# Patient Record
Sex: Male | Born: 1980 | Race: White | Hispanic: No | Marital: Single | Smoking: Current every day smoker
Health system: Southern US, Community
[De-identification: ages and names within clinical notes are randomized; demographics above are authoritative.]

## PROBLEM LIST (undated history)

## (undated) DIAGNOSIS — F32A Depression, unspecified: Secondary | ICD-10-CM

## (undated) DIAGNOSIS — F1020 Alcohol dependence, uncomplicated: Secondary | ICD-10-CM

## (undated) DIAGNOSIS — F329 Major depressive disorder, single episode, unspecified: Secondary | ICD-10-CM

## (undated) DIAGNOSIS — F419 Anxiety disorder, unspecified: Secondary | ICD-10-CM

## (undated) DIAGNOSIS — F1910 Other psychoactive substance abuse, uncomplicated: Secondary | ICD-10-CM

## (undated) HISTORY — DX: Other psychoactive substance abuse, uncomplicated: F19.10

---

## 2018-12-24 ENCOUNTER — Emergency Department (HOSPITAL_COMMUNITY)
Admission: EM | Admit: 2018-12-24 | Discharge: 2018-12-24 | Disposition: A | Discharge status: Home / Self Care | Payer: Self-pay | Attending: Emergency Medicine | Admitting: Emergency Medicine

## 2018-12-24 ENCOUNTER — Encounter (HOSPITAL_COMMUNITY): Payer: Self-pay | Admitting: Emergency Medicine

## 2018-12-24 ENCOUNTER — Other Ambulatory Visit: Payer: Self-pay

## 2018-12-24 ENCOUNTER — Emergency Department (HOSPITAL_COMMUNITY): Payer: Self-pay

## 2018-12-24 DIAGNOSIS — F419 Anxiety disorder, unspecified: Secondary | ICD-10-CM | POA: Insufficient documentation

## 2018-12-24 DIAGNOSIS — R0789 Other chest pain: Secondary | ICD-10-CM | POA: Insufficient documentation

## 2018-12-24 DIAGNOSIS — F17210 Nicotine dependence, cigarettes, uncomplicated: Secondary | ICD-10-CM | POA: Insufficient documentation

## 2018-12-24 DIAGNOSIS — R110 Nausea: Secondary | ICD-10-CM | POA: Insufficient documentation

## 2018-12-24 DIAGNOSIS — R451 Restlessness and agitation: Secondary | ICD-10-CM | POA: Insufficient documentation

## 2018-12-24 DIAGNOSIS — Z79899 Other long term (current) drug therapy: Secondary | ICD-10-CM | POA: Insufficient documentation

## 2018-12-24 DIAGNOSIS — R6884 Jaw pain: Secondary | ICD-10-CM | POA: Insufficient documentation

## 2018-12-24 HISTORY — DX: Major depressive disorder, single episode, unspecified: F32.9

## 2018-12-24 HISTORY — DX: Anxiety disorder, unspecified: F41.9

## 2018-12-24 HISTORY — DX: Depression, unspecified: F32.A

## 2018-12-24 LAB — COMPREHENSIVE METABOLIC PANEL
ALT: 32 U/L (ref 0–44)
ANION GAP: 8 (ref 5–15)
AST: 26 U/L (ref 15–41)
Albumin: 5 g/dL (ref 3.5–5.0)
Alkaline Phosphatase: 82 U/L (ref 38–126)
BUN: 8 mg/dL (ref 6–20)
CO2: 26 mmol/L (ref 22–32)
Calcium: 9.8 mg/dL (ref 8.9–10.3)
Chloride: 106 mmol/L (ref 98–111)
Creatinine, Ser: 0.98 mg/dL (ref 0.61–1.24)
GFR calc Af Amer: >60 mL/min (ref >60)
GFR calc non Af Amer: >60 mL/min (ref >60)
Glucose, Bld: 101 mg/dL — ABNORMAL HIGH (ref 70–99)
POTASSIUM: 4.2 mmol/L (ref 3.5–5.1)
Sodium: 140 mmol/L (ref 135–145)
Total Bilirubin: 0.7 mg/dL (ref 0.3–1.2)
Total Protein: 8.4 g/dL — ABNORMAL HIGH (ref 6.5–8.1)

## 2018-12-24 LAB — URINALYSIS, ROUTINE W REFLEX MICROSCOPIC
BILIRUBIN URINE: NEGATIVE
Glucose, UA: NEGATIVE mg/dL
Hgb urine dipstick: NEGATIVE
Ketones, ur: NEGATIVE mg/dL
Leukocytes, UA: NEGATIVE
Nitrite: NEGATIVE
PROTEIN: NEGATIVE mg/dL
Specific Gravity, Urine: 1.006 (ref 1.005–1.030)
pH: 8 (ref 5.0–8.0)

## 2018-12-24 LAB — CBC WITH DIFFERENTIAL/PLATELET
Abs Immature Granulocytes: 0.05 K/uL (ref 0.00–0.07)
Basophils Absolute: 0.1 K/uL (ref 0.0–0.1)
Basophils Relative: 1 %
Eosinophils Absolute: 0.1 K/uL (ref 0.0–0.5)
Eosinophils Relative: 1 %
HCT: 49.1 % (ref 39.0–52.0)
Hemoglobin: 15.9 g/dL (ref 13.0–17.0)
Immature Granulocytes: 0 %
Lymphocytes Relative: 18 %
Lymphs Abs: 2.1 K/uL (ref 0.7–4.0)
MCH: 29.6 pg (ref 26.0–34.0)
MCHC: 32.4 g/dL (ref 30.0–36.0)
MCV: 91.4 fL (ref 80.0–100.0)
Monocytes Absolute: 0.7 K/uL (ref 0.1–1.0)
Monocytes Relative: 6 %
Neutro Abs: 8.7 K/uL — ABNORMAL HIGH (ref 1.7–7.7)
Neutrophils Relative %: 74 %
Platelets: 267 K/uL (ref 150–400)
RBC: 5.37 MIL/uL (ref 4.22–5.81)
RDW: 13.8 % (ref 11.5–15.5)
WBC: 11.7 K/uL — ABNORMAL HIGH (ref 4.0–10.5)
nRBC: 0 % (ref 0.0–0.2)

## 2018-12-24 LAB — LIPASE, BLOOD: Lipase: 39 U/L (ref 11–51)

## 2018-12-24 LAB — ETHANOL: Alcohol, Ethyl (B): <10 mg/dL (ref <10)

## 2018-12-24 MED ORDER — LORAZEPAM 1 MG PO TABS
1.0000 mg | ORAL_TABLET | Freq: Once | ORAL | Status: AC
  Administered 2018-12-24: 1 mg via ORAL
  Filled 2018-12-24: qty 1

## 2018-12-24 NOTE — Discharge Instructions (Signed)
As discussed, your evaluation today has been largely reassuring.  But, it is important that you monitor your condition carefully, and do not hesitate to return to the ED if you develop new, or concerning changes in your condition. ? ?Otherwise, please follow-up with your physician for appropriate ongoing care. ? ?

## 2018-12-24 NOTE — ED Triage Notes (Signed)
Pt states he thinks he is having a panic attack. Reports dizziness when he woke up and states, "I feel like my body is seizing up." Also endorses chest tightness. States he is "coming off" of Subutex. Last taken 4-5 days ago.

## 2018-12-24 NOTE — ED Provider Notes (Signed)
[facility] EMERGENCY DEPARTMENT Provider Note   CSN: [id number] Arrival date & time: 12/24/18  1037     History   Chief Complaint Chief Complaint  Patient presents with  . Panic Attack    HPI Carlos Flowers is a 38 y.o. male.  HPI Patient presents concern of chest pain, jaw pain, restlessness. Patient has no history of cardiac disease, no family history of early cardiac death. He also denies history of diabetes, hypertension or other medical complaints. He does, however, have a history of anxiety, depression for which he takes gabapentin and Wellbutrin. Patient also smokes cigarettes Patient also notes a history of long-term drug use, most notably cocaine. Patient recently stopped his illicit substances, has been using Suboxone. However, last dose of Suboxone was approximately 4 days ago, and he entered a rehabilitation program yesterday. Over the past few hours patient has had persistent discomfort in his chest, jaw, nausea, anxiousness, without dyspnea, syncope, though there is some lightheadedness. Symptoms have improved somewhat since onset, though without clear intervention. No clear alleviating or exacerbating factors.  Past Medical History:  Diagnosis Date  . Anxiety   . Depression     There are no active problems to display for this patient.   History reviewed. No pertinent surgical history.      Home Medications    Prior to Admission medications   Not on File    Family History No family history on file.  Social History Social History   Tobacco Use  . Smoking status: Current Every Day Smoker    Packs/day: 1.00    Types: Cigarettes  . Smokeless tobacco: Never Used  Substance Use Topics  . Alcohol use: Not Currently    Comment: former   . Drug use: Not Currently    Types: Cocaine    Comment: @ opiates, last use 2 weeks ago     Allergies   Patient has no known allergies.   Review of Systems Review of Systems  Constitutional:   Per HPI, otherwise negative  HENT:       Per HPI, otherwise negative  Respiratory:       Per HPI, otherwise negative  Cardiovascular:       Per HPI, otherwise negative  Gastrointestinal: Negative for vomiting.  Endocrine:       Negative aside from HPI  Genitourinary:       Neg aside from HPI   Musculoskeletal:       Per HPI, otherwise negative  Skin: Negative.   Neurological: Negative for syncope.     Physical Exam Updated Vital Signs BP (!) 150/96 (BP Location: Right Arm)   Pulse 79   Temp (!) 97.5 F (36.4 C) (Oral)   Resp 18   Ht 6' (1.829 m)   Wt 80.7 kg   SpO2 100%   BMI 24.14 kg/m   Physical Exam Vitals signs and nursing note reviewed.  Constitutional:      General: He is not in acute distress.    Appearance: He is well-developed.  HENT:     Head: Normocephalic and atraumatic.  Eyes:     Conjunctiva/sclera: Conjunctivae normal.  Cardiovascular:     Rate and Rhythm: Normal rate and regular rhythm.  Pulmonary:     Effort: Pulmonary effort is normal. No respiratory distress.     Breath sounds: No stridor.  Abdominal:     General: There is no distension.  Skin:    General: Skin is warm and dry.  Neurological:  Mental Status: He is alert and oriented to person, place, and time.      ED Treatments / Results  Labs (all labs ordered are listed, but only abnormal results are displayed) Labs Reviewed  COMPREHENSIVE METABOLIC PANEL - Abnormal; Notable for the following components:      Result Value   Glucose, Bld 101 (*)    Total Protein 8.4 (*)    All other components within normal limits  CBC WITH DIFFERENTIAL/PLATELET - Abnormal; Notable for the following components:   WBC 11.7 (*)    Neutro Abs 8.7 (*)    All other components within normal limits  LIPASE, BLOOD  ETHANOL  URINALYSIS, ROUTINE W REFLEX MICROSCOPIC    EKG Sinus rhythm, rate 81, unremarkable aside from mild early re-pole pattern    Procedures Procedures (including critical  care time)  Medications Ordered in ED Medications  LORazepam (ATIVAN) tablet 1 mg (has no administration in time range)     Initial Impression / Assessment and Plan / ED Course  I have reviewed the triage vital signs and the nursing notes.  Pertinent labs & imaging results that were available during my care of the patient were reviewed by me and considered in my medical decision making (see chart for details).  Revisited patient is in no distress, is awake, alert. Labs reassuring, EKG reassuring, no evidence for ACS, or other acute new pathology. Patient has a mild leukocytosis, though no fever, no cough, no evidence for pneumonia.  Suspicion for withdrawal contributing to some of the patient's symptoms. Patient is, however appropriate for return to his rehabilitation facility, in stable condition.    Final Clinical Impressions(s) / ED Diagnoses  Atypical chest pain   Gerhard Munch, MD 12/24/18 1331

## 2018-12-30 ENCOUNTER — Emergency Department (HOSPITAL_COMMUNITY): Payer: Self-pay

## 2018-12-30 ENCOUNTER — Emergency Department (HOSPITAL_COMMUNITY)
Admission: EM | Admit: 2018-12-30 | Discharge: 2018-12-30 | Disposition: A | Discharge status: Home / Self Care | Payer: Self-pay | Attending: Emergency Medicine | Admitting: Emergency Medicine

## 2018-12-30 ENCOUNTER — Encounter (HOSPITAL_COMMUNITY): Payer: Self-pay | Admitting: Emergency Medicine

## 2018-12-30 ENCOUNTER — Other Ambulatory Visit: Payer: Self-pay

## 2018-12-30 DIAGNOSIS — F17210 Nicotine dependence, cigarettes, uncomplicated: Secondary | ICD-10-CM | POA: Insufficient documentation

## 2018-12-30 DIAGNOSIS — F410 Panic disorder [episodic paroxysmal anxiety] without agoraphobia: Secondary | ICD-10-CM | POA: Insufficient documentation

## 2018-12-30 LAB — TROPONIN I: Troponin I: <0.03 ng/mL (ref <0.03)

## 2018-12-30 LAB — BASIC METABOLIC PANEL
Anion gap: 10 (ref 5–15)
BUN: 10 mg/dL (ref 6–20)
CO2: 22 mmol/L (ref 22–32)
Calcium: 9.6 mg/dL (ref 8.9–10.3)
Chloride: 106 mmol/L (ref 98–111)
Creatinine, Ser: 1.06 mg/dL (ref 0.61–1.24)
GFR calc Af Amer: >60 mL/min (ref >60)
GFR calc non Af Amer: >60 mL/min (ref >60)
Glucose, Bld: 97 mg/dL (ref 70–99)
POTASSIUM: 3.6 mmol/L (ref 3.5–5.1)
Sodium: 138 mmol/L (ref 135–145)

## 2018-12-30 LAB — CBC
HCT: 47.4 % (ref 39.0–52.0)
Hemoglobin: 15.5 g/dL (ref 13.0–17.0)
MCH: 29.1 pg (ref 26.0–34.0)
MCHC: 32.7 g/dL (ref 30.0–36.0)
MCV: 88.9 fL (ref 80.0–100.0)
PLATELETS: 315 10*3/uL (ref 150–400)
RBC: 5.33 MIL/uL (ref 4.22–5.81)
RDW: 13.8 % (ref 11.5–15.5)
WBC: 9.1 10*3/uL (ref 4.0–10.5)
nRBC: 0 % (ref 0.0–0.2)

## 2018-12-30 MED ORDER — LORAZEPAM 1 MG PO TABS
1.0000 mg | ORAL_TABLET | Freq: Once | ORAL | Status: AC
  Administered 2018-12-30: 1 mg via ORAL
  Filled 2018-12-30: qty 1

## 2018-12-30 MED ORDER — SODIUM CHLORIDE 0.9% FLUSH: 3.0000 mL | Freq: Once | INTRAVENOUS | Status: DC

## 2018-12-30 NOTE — ED Provider Notes (Signed)
[facility] EMERGENCY DEPARTMENT Provider Note   CSN: [id number] Arrival date & time: 12/30/18  1039     History   Chief Complaint Chief Complaint  Patient presents with  . Chest Pain    HPI Carlos Flowers is a 38 y.o. male.  HPI  38 year old male comes in with chief complaint of palpitations, chest discomfort and shortness of breath. Patient is currently at a substance abuse recovery home.  He reports that he has not had any drugs in over a week.  He used to use cocaine, opiates and Subutex.  He states that this morning all of a sudden he started having left-sided chest discomfort with shortness of breath.  He also felt numbness and tingling.  The symptoms were similar to the previous episode he had last week.  He denies any specific provoking, aggravating or relieving factors.  Past Medical History:  Diagnosis Date  . Anxiety   . Depression     There are no active problems to display for this patient.   History reviewed. No pertinent surgical history.      Home Medications    Prior to Admission medications   Medication Sig Start Date End Date Taking? Authorizing Provider  buPROPion (WELLBUTRIN XL) 300 MG 24 hr tablet Take 450 mg by mouth daily.   Yes [provider]  gabapentin (NEURONTIN) 600 MG tablet Take 600 mg by mouth 4 (four) times daily.   Yes [provider]    Family History History reviewed. No pertinent family history.  Social History Social History   Tobacco Use  . Smoking status: Current Every Day Smoker    Packs/day: 1.00    Types: Cigarettes  . Smokeless tobacco: Never Used  Substance Use Topics  . Alcohol use: Not Currently    Comment: former   . Drug use: Not Currently    Types: Cocaine    Comment: @ opiates, last use 2 weeks ago     Allergies   Peanut-containing drug products   Review of Systems Review of Systems  Constitutional: Positive for activity change.  Respiratory: Positive for chest tightness and  shortness of breath.   Cardiovascular: Positive for chest pain.  Neurological: Negative for syncope.     Physical Exam Updated Vital Signs BP (!) 123/95 (BP Location: Right Arm)   Pulse 82   Temp 97.6 F (36.4 C) (Oral)   Resp 17   Ht 6' (1.829 m)   Wt 81.6 kg   SpO2 100%   BMI 24.41 kg/m   Physical Exam Vitals signs and nursing note reviewed.  Constitutional:      Appearance: He is well-developed.  HENT:     Head: Atraumatic.  Neck:     Musculoskeletal: Neck supple.  Cardiovascular:     Rate and Rhythm: Normal rate.     Heart sounds: Normal heart sounds.  Pulmonary:     Effort: Pulmonary effort is normal.     Breath sounds: Normal breath sounds.  Skin:    General: Skin is warm.  Neurological:     Mental Status: He is alert and oriented to person, place, and time.      ED Treatments / Results  Labs (all labs ordered are listed, but only abnormal results are displayed) Labs Reviewed  BASIC METABOLIC PANEL  CBC  TROPONIN I    EKG EKG Interpretation  Date/Time:  Wednesday December 30 2018 10:50:30 EST Ventricular Rate:  94 PR Interval:  138 QRS Duration: 90 QT Interval:  360 QTC  Calculation: 450 R Axis:   91 Text Interpretation:  Normal sinus rhythm Rightward axis Borderline ECG No significant change since last tracing Confirmed by Linwood Dibbles [id number]) on 12/30/2018 11:11:48 AM   Radiology Dg Chest 2 View  Result Date: 12/30/2018 CLINICAL DATA:  Left chest pain and shortness of breath beginning this morning. EXAM: CHEST - 2 VIEW COMPARISON:  PA and lateral chest 12/24/2018. FINDINGS: The lungs are clear. Heart size is normal. No pneumothorax or pleural fluid. No acute or focal bony abnormality. IMPRESSION: Negative chest. Electronically Signed   By: Drusilla Kanner M.D.   On: 12/30/2018 11:19    Procedures Procedures (including critical care time)  Medications Ordered in ED Medications  sodium chloride flush (NS) 0.9 % injection 3 mL (has no  administration in time range)  LORazepam (ATIVAN) tablet 1 mg (1 mg Oral Given 12/30/18 1436)     Initial Impression / Assessment and Plan / ED Course  I have reviewed the triage vital signs and the nursing notes.  Pertinent labs & imaging results that were available during my care of the patient were reviewed by me and considered in my medical decision making (see chart for details).    38 year old male comes in with chief complaint of chest discomfort.  He has history of polysubstance abuse and is currently residing at a rehab facility.  He has been drug-free for more than a week.  He had an episode last week which was similar to the events he had today.  Patient had unprovoked episode earlier today where he started having chest discomfort, palpitations, shortness of breath.  His breathing got worse and he called EMS.  By the time we saw him he was calm and did not have any respiratory distress.  Vital signs are within normal limits.  Patient does not have any family history of premature CAD or sudden cardiac death.  He has been monitored on telemetry for extended period time without any alarms.  I suspect that his symptoms could have been because of drug withdrawal or because of psychiatry medications that he was started.  EKG is not showing any QT prolongation.  Stable for discharge.   Final Clinical Impressions(s) / ED Diagnoses   Final diagnoses:  Anxiety attack    ED Discharge Orders    None       Derwood Kaplan, MD 12/30/18 [phone]

## 2018-12-30 NOTE — Discharge Instructions (Addendum)
We suspect that the event you had prior to ER arrival was an anxiety attack. There could be an element of withdrawals Please discuss your symptoms with your psychiatrist.

## 2018-12-30 NOTE — ED Triage Notes (Addendum)
Patient complaining of left sided chest pain and shortness of breath starting this morning. Patient has paperwork that states he is from [facility] and not to give patient opiates or benzos. Patient agitated in triage, states "I don't understand why ya'll don't take this seriously, I could be dying and no one would care."

## 2019-01-07 ENCOUNTER — Ambulatory Visit: Payer: Self-pay | Admitting: Physician Assistant

## 2019-01-07 ENCOUNTER — Encounter: Payer: Self-pay | Admitting: Physician Assistant

## 2019-01-07 VITALS — BP 129/81 | HR 76 | Temp 97.9°F | Ht 71.5 in | Wt 187.0 lb

## 2019-01-07 DIAGNOSIS — F419 Anxiety disorder, unspecified: Secondary | ICD-10-CM

## 2019-01-07 DIAGNOSIS — F1911 Other psychoactive substance abuse, in remission: Secondary | ICD-10-CM

## 2019-01-07 DIAGNOSIS — Z7689 Persons encountering health services in other specified circumstances: Secondary | ICD-10-CM

## 2019-01-07 DIAGNOSIS — F17200 Nicotine dependence, unspecified, uncomplicated: Secondary | ICD-10-CM

## 2019-01-07 NOTE — Progress Notes (Signed)
BP 129/81 (BP Location: Left Arm, Patient Position: Sitting, Cuff Size: Normal)   Pulse 76   Temp 97.9 F (36.6 C)   Ht 5' 11.5" (1.816 m)   Wt 187 lb (84.8 kg)   SpO2 98%   BMI 25.72 kg/m    Subjective:    Patient ID: Carlos Flowers, male    DOB: 1981-04-09, 38 y.o.   MRN: [id number]  HPI: Carlos Flowers is a 38 y.o. male presenting on 01/07/2019 for New Patient (Initial Visit) (current pt at [facility] for mental health. pt states he has not had PCP)   HPI   Chief Complaint  Patient presents with  . New Patient (Initial Visit)    current pt at [facility] for mental health. pt states he has not had PCP     Pt moved into [facility] house about 1/22 for treatment of polysubstance abuse.  Prior to that, pt was working and living in [place].   Pt has no complaints today except anxiety.   Relevant past medical, surgical, family and social history reviewed and updated as indicated. Interim medical history since our last visit reviewed. Allergies and medications reviewed and updated.   Current Outpatient Medications:  .  buPROPion (WELLBUTRIN XL) 300 MG 24 hr tablet, Take 450 mg by mouth daily., Disp: , Rfl:  .  gabapentin (NEURONTIN) 600 MG tablet, Take 600 mg by mouth 4 (four) times daily., Disp: , Rfl:     Review of Systems  Constitutional: Positive for diaphoresis, fatigue and unexpected weight change. Negative for appetite change, chills and fever.  HENT: Positive for dental problem. Negative for congestion, drooling, ear pain, facial swelling, hearing loss, mouth sores, sneezing, sore throat, trouble swallowing and voice change.   Eyes: Negative for pain, discharge, redness, itching and visual disturbance.  Respiratory: Negative for cough, choking, shortness of breath and wheezing.   Cardiovascular: Negative for chest pain, palpitations and leg swelling.  Gastrointestinal: Negative for abdominal pain, blood in stool, constipation, diarrhea and vomiting.  Endocrine:  Negative for cold intolerance, heat intolerance and polydipsia.  Genitourinary: Negative for decreased urine volume, dysuria and hematuria.  Musculoskeletal: Negative for arthralgias, back pain and gait problem.  Skin: Negative for rash.  Allergic/Immunologic: Negative for environmental allergies.  Neurological: Negative for seizures, syncope, light-headedness and headaches.  Hematological: Negative for adenopathy.  Psychiatric/Behavioral: Positive for agitation and dysphoric mood. Negative for suicidal ideas. The patient is nervous/anxious.     Per HPI unless specifically indicated above     Objective:    BP 129/81 (BP Location: Left Arm, Patient Position: Sitting, Cuff Size: Normal)   Pulse 76   Temp 97.9 F (36.6 C)   Ht 5' 11.5" (1.816 m)   Wt 187 lb (84.8 kg)   SpO2 98%   BMI 25.72 kg/m   Wt Readings from Last 3 Encounters:  01/07/19 187 lb (84.8 kg)  12/30/18 180 lb (81.6 kg)  12/24/18 178 lb (80.7 kg)    Physical Exam Vitals signs reviewed.  Constitutional:      Appearance: He is well-developed.  HENT:     Head: Normocephalic and atraumatic.     Mouth/Throat:     Pharynx: No oropharyngeal exudate.  Eyes:     Conjunctiva/sclera: Conjunctivae normal.     Pupils: Pupils are equal, round, and reactive to light.  Neck:     Musculoskeletal: Neck supple.     Thyroid: No thyromegaly.  Cardiovascular:     Rate and Rhythm: Normal rate and regular rhythm.  Pulmonary:  Effort: Pulmonary effort is normal.     Breath sounds: Normal breath sounds. No wheezing or rales.  Abdominal:     General: Bowel sounds are normal.     Palpations: Abdomen is soft. There is no mass.     Tenderness: There is no abdominal tenderness.  Lymphadenopathy:     Cervical: No cervical adenopathy.  Skin:    General: Skin is warm and dry.     Findings: No rash.  Neurological:     Mental Status: He is alert and oriented to person, place, and time.  Psychiatric:        Behavior: Behavior  normal.        Thought Content: Thought content normal.     Comments: Pt mildly anxious appearing but is appropriate and pleasant and in no distress.     Results for orders placed or performed during the hospital encounter of 12/30/18  Basic metabolic panel  Result Value Ref Range   Sodium 138 135 - 145 mmol/L   Potassium 3.6 3.5 - 5.1 mmol/L   Chloride 106 98 - 111 mmol/L   CO2 22 22 - 32 mmol/L   Glucose, Bld 97 70 - 99 mg/dL   BUN 10 6 - 20 mg/dL   Creatinine, Ser 1.611.06 0.61 - 1.24 mg/dL   Calcium 9.6 8.9 - 09.610.3 mg/dL   GFR calc non Af Amer >60 >60 mL/min   GFR calc Af Amer >60 >60 mL/min   Anion gap 10 5 - 15  CBC  Result Value Ref Range   WBC 9.1 4.0 - 10.5 K/uL   RBC 5.33 4.22 - 5.81 MIL/uL   Hemoglobin 15.5 13.0 - 17.0 g/dL   HCT 04.547.4 40.939.0 - 81.152.0 %   MCV 88.9 80.0 - 100.0 fL   MCH 29.1 26.0 - 34.0 pg   MCHC 32.7 30.0 - 36.0 g/dL   RDW 91.413.8 78.211.5 - 95.615.5 %   Platelets 315 150 - 400 K/uL   nRBC 0.0 0.0 - 0.2 %  Troponin I - ONCE - STAT  Result Value Ref Range   Troponin I <0.03 <0.03 ng/mL      Assessment & Plan:    Encounter Diagnoses  Name Primary?  . Encounter to establish care Yes  . Substance abuse in remission (HCC)   . Tobacco use disorder   . Anxiety       -no further labs needed at this time -pt encouraged to continue with [facility] for his anxiety -pt encouraged to continue with [facility] house for treatment of his substance abuse -pt to follow up here 1 year.  RTO sooner prn

## 2019-11-23 ENCOUNTER — Ambulatory Visit: Payer: HRSA Program | Attending: Internal Medicine

## 2019-11-23 ENCOUNTER — Other Ambulatory Visit: Payer: Self-pay

## 2019-11-23 DIAGNOSIS — Z20828 Contact with and (suspected) exposure to other viral communicable diseases: Secondary | ICD-10-CM | POA: Diagnosis present

## 2019-11-23 DIAGNOSIS — Z20822 Contact with and (suspected) exposure to covid-19: Secondary | ICD-10-CM

## 2019-11-24 LAB — NOVEL CORONAVIRUS, NAA: SARS-CoV-2, NAA: NOT DETECTED

## 2019-12-07 ENCOUNTER — Ambulatory Visit: Payer: Self-pay | Admitting: Physician Assistant

## 2019-12-08 ENCOUNTER — Ambulatory Visit: Payer: Self-pay | Admitting: Physician Assistant

## 2020-01-06 ENCOUNTER — Ambulatory Visit: Payer: Self-pay | Admitting: Physician Assistant

## 2020-08-08 ENCOUNTER — Other Ambulatory Visit: Payer: Self-pay

## 2020-08-08 ENCOUNTER — Ambulatory Visit (HOSPITAL_COMMUNITY)
Admission: EM | Admit: 2020-08-08 | Discharge: 2020-08-10 | Disposition: A | Discharge status: Rehabilitation Facility | Payer: No Payment, Other | Attending: Psychiatry | Admitting: Psychiatry

## 2020-08-08 DIAGNOSIS — F419 Anxiety disorder, unspecified: Secondary | ICD-10-CM | POA: Insufficient documentation

## 2020-08-08 DIAGNOSIS — Z590 Homelessness: Secondary | ICD-10-CM | POA: Insufficient documentation

## 2020-08-08 DIAGNOSIS — Z20822 Contact with and (suspected) exposure to covid-19: Secondary | ICD-10-CM | POA: Insufficient documentation

## 2020-08-08 DIAGNOSIS — F17210 Nicotine dependence, cigarettes, uncomplicated: Secondary | ICD-10-CM | POA: Insufficient documentation

## 2020-08-08 DIAGNOSIS — F332 Major depressive disorder, recurrent severe without psychotic features: Secondary | ICD-10-CM | POA: Diagnosis not present

## 2020-08-08 DIAGNOSIS — F1010 Alcohol abuse, uncomplicated: Secondary | ICD-10-CM | POA: Insufficient documentation

## 2020-08-08 DIAGNOSIS — F10230 Alcohol dependence with withdrawal, uncomplicated: Secondary | ICD-10-CM

## 2020-08-08 LAB — COMPREHENSIVE METABOLIC PANEL
ALT: 18 U/L (ref 0–44)
AST: 19 U/L (ref 15–41)
Albumin: 4.6 g/dL (ref 3.5–5.0)
Alkaline Phosphatase: 101 U/L (ref 38–126)
Anion gap: 11 (ref 5–15)
BUN: <5 mg/dL — ABNORMAL LOW (ref 6–20)
CO2: 25 mmol/L (ref 22–32)
Calcium: 9.3 mg/dL (ref 8.9–10.3)
Chloride: 104 mmol/L (ref 98–111)
Creatinine, Ser: 0.86 mg/dL (ref 0.61–1.24)
GFR calc Af Amer: >60 mL/min (ref >60)
GFR calc non Af Amer: >60 mL/min (ref >60)
Glucose, Bld: 90 mg/dL (ref 70–99)
Potassium: 4 mmol/L (ref 3.5–5.1)
Sodium: 140 mmol/L (ref 135–145)
Total Bilirubin: 1.1 mg/dL (ref 0.3–1.2)
Total Protein: 7.9 g/dL (ref 6.5–8.1)

## 2020-08-08 LAB — CBC WITH DIFFERENTIAL/PLATELET
Abs Immature Granulocytes: 0.06 10*3/uL (ref 0.00–0.07)
Basophils Absolute: 0.1 10*3/uL (ref 0.0–0.1)
Basophils Relative: 1 %
Eosinophils Absolute: 0.1 10*3/uL (ref 0.0–0.5)
Eosinophils Relative: 1 %
HCT: 48.8 % (ref 39.0–52.0)
Hemoglobin: 16.3 g/dL (ref 13.0–17.0)
Immature Granulocytes: 1 %
Lymphocytes Relative: 28 %
Lymphs Abs: 2.8 10*3/uL (ref 0.7–4.0)
MCH: 30.9 pg (ref 26.0–34.0)
MCHC: 33.4 g/dL (ref 30.0–36.0)
MCV: 92.6 fL (ref 80.0–100.0)
Monocytes Absolute: 0.7 10*3/uL (ref 0.1–1.0)
Monocytes Relative: 7 %
Neutro Abs: 6.4 10*3/uL (ref 1.7–7.7)
Neutrophils Relative %: 62 %
Platelets: 253 10*3/uL (ref 150–400)
RBC: 5.27 MIL/uL (ref 4.22–5.81)
RDW: 14.2 % (ref 11.5–15.5)
WBC: 10.1 10*3/uL (ref 4.0–10.5)
nRBC: 0 % (ref 0.0–0.2)

## 2020-08-08 LAB — POC SARS CORONAVIRUS 2 AG: SARS Coronavirus 2 Ag: NEGATIVE

## 2020-08-08 LAB — SARS CORONAVIRUS 2 BY RT PCR (HOSPITAL ORDER, PERFORMED IN ~~LOC~~ HOSPITAL LAB): SARS Coronavirus 2: NEGATIVE

## 2020-08-08 LAB — LIPID PANEL
Cholesterol: 129 mg/dL (ref 0–200)
HDL: 45 mg/dL (ref >40)
LDL Cholesterol: 62 mg/dL (ref 0–99)
Total CHOL/HDL Ratio: 2.9 RATIO
Triglycerides: 111 mg/dL (ref <150)
VLDL: 22 mg/dL (ref 0–40)

## 2020-08-08 LAB — POCT URINE DRUG SCREEN - MANUAL ENTRY (I-SCREEN)
POC Amphetamine UR: NOT DETECTED
POC Buprenorphine (BUP): NOT DETECTED
POC Cocaine UR: NOT DETECTED
POC Marijuana UR: NOT DETECTED
POC Methadone UR: NOT DETECTED
POC Methamphetamine UR: NOT DETECTED
POC Morphine: NOT DETECTED
POC Oxazepam (BZO): NOT DETECTED
POC Oxycodone UR: NOT DETECTED
POC Secobarbital (BAR): NOT DETECTED

## 2020-08-08 LAB — POC SARS CORONAVIRUS 2 AG -  ED: SARS Coronavirus 2 Ag: NEGATIVE

## 2020-08-08 LAB — HEMOGLOBIN A1C
Hgb A1c MFr Bld: 5.5 % (ref 4.8–5.6)
Mean Plasma Glucose: 111.15 mg/dL

## 2020-08-08 LAB — TSH: TSH: 1.59 u[IU]/mL (ref 0.350–4.500)

## 2020-08-08 LAB — ETHANOL: Alcohol, Ethyl (B): 113 mg/dL — ABNORMAL HIGH (ref <10)

## 2020-08-08 MED ORDER — HYDROXYZINE HCL 25 MG PO TABS
25.0000 mg | ORAL_TABLET | Freq: Four times a day (QID) | ORAL | Status: DC | PRN
  Administered 2020-08-09: 25 mg via ORAL
  Filled 2020-08-08: qty 20
  Filled 2020-08-08: qty 1
  Filled 2020-08-08: qty 20

## 2020-08-08 MED ORDER — LORAZEPAM 1 MG PO TABS
1.0000 mg | ORAL_TABLET | Freq: Four times a day (QID) | ORAL | Status: DC | PRN
  Administered 2020-08-08 – 2020-08-09 (×4): 1 mg via ORAL
  Filled 2020-08-08 (×4): qty 1

## 2020-08-08 MED ORDER — ADULT MULTIVITAMIN W/MINERALS CH
1.0000 | ORAL_TABLET | Freq: Every day | ORAL | Status: DC
  Administered 2020-08-08 – 2020-08-09 (×2): 1 via ORAL
  Filled 2020-08-08 (×3): qty 1

## 2020-08-08 MED ORDER — GABAPENTIN 400 MG PO CAPS
400.0000 mg | ORAL_CAPSULE | Freq: Three times a day (TID) | ORAL | Status: DC
  Administered 2020-08-08 – 2020-08-09 (×5): 400 mg via ORAL
  Filled 2020-08-08 (×2): qty 1
  Filled 2020-08-08: qty 42
  Filled 2020-08-08 (×3): qty 1

## 2020-08-08 MED ORDER — TRAZODONE HCL 50 MG PO TABS: 50.0000 mg | ORAL_TABLET | Freq: Every evening | ORAL | Status: DC | PRN

## 2020-08-08 MED ORDER — CLONIDINE HCL 0.1 MG PO TABS
0.1000 mg | ORAL_TABLET | Freq: Every day | ORAL | Status: DC
  Administered 2020-08-08 – 2020-08-09 (×2): 0.1 mg via ORAL
  Filled 2020-08-08: qty 14
  Filled 2020-08-08 (×2): qty 1

## 2020-08-08 MED ORDER — HYDROXYZINE HCL 25 MG PO TABS: 25.0000 mg | ORAL_TABLET | Freq: Three times a day (TID) | ORAL | Status: DC | PRN

## 2020-08-08 MED ORDER — PAROXETINE HCL 20 MG PO TABS
20.0000 mg | ORAL_TABLET | Freq: Every day | ORAL | Status: DC
  Administered 2020-08-08 – 2020-08-09 (×2): 20 mg via ORAL
  Filled 2020-08-08 (×2): qty 1
  Filled 2020-08-08: qty 14

## 2020-08-08 MED ORDER — ONDANSETRON 4 MG PO TBDP
4.0000 mg | ORAL_TABLET | Freq: Four times a day (QID) | ORAL | Status: DC | PRN
  Administered 2020-08-09: 4 mg via ORAL
  Filled 2020-08-08: qty 1

## 2020-08-08 MED ORDER — LOPERAMIDE HCL 2 MG PO CAPS: 2.0000 mg | ORAL_CAPSULE | ORAL | Status: DC | PRN

## 2020-08-08 MED ORDER — NICOTINE POLACRILEX 2 MG MT GUM
2.0000 mg | CHEWING_GUM | OROMUCOSAL | Status: DC | PRN
  Administered 2020-08-09 – 2020-08-10 (×7): 2 mg via ORAL
  Filled 2020-08-08 (×5): qty 1
  Filled 2020-08-08: qty 15
  Filled 2020-08-08 (×2): qty 1

## 2020-08-08 MED ORDER — THIAMINE HCL 100 MG PO TABS
100.0000 mg | ORAL_TABLET | Freq: Every day | ORAL | Status: DC
  Administered 2020-08-09: 100 mg via ORAL
  Filled 2020-08-08: qty 1

## 2020-08-08 MED ORDER — ALUM & MAG HYDROXIDE-SIMETH 200-200-20 MG/5ML PO SUSP: 30.0000 mL | ORAL | Status: DC | PRN

## 2020-08-08 MED ORDER — MAGNESIUM HYDROXIDE 400 MG/5ML PO SUSP: 30.0000 mL | Freq: Every day | ORAL | Status: DC | PRN

## 2020-08-08 MED ORDER — ACETAMINOPHEN 325 MG PO TABS
650.0000 mg | ORAL_TABLET | Freq: Four times a day (QID) | ORAL | Status: DC | PRN
  Administered 2020-08-08 – 2020-08-09 (×2): 650 mg via ORAL
  Filled 2020-08-08 (×2): qty 2

## 2020-08-08 MED ORDER — THIAMINE HCL 100 MG/ML IJ SOLN
100.0000 mg | Freq: Once | INTRAMUSCULAR | Status: AC
  Administered 2020-08-08: 100 mg via INTRAMUSCULAR
  Filled 2020-08-08: qty 2

## 2020-08-08 NOTE — ED Notes (Signed)
Blood drawn from right arm ac with 23g needle. Tolerated well. Pressure bandage appliled. No new issues noted

## 2020-08-08 NOTE — BH Assessment (Signed)
Comprehensive Clinical Assessment (CCA) Note  08/08/2020 Carlos Flowers [id number]   Patient is a 39 year old male presenting voluntarily to [facility] for assessment. Patient endorses SI, alcohol withdrawal, and increased anxiety. Patient reports he has been homeless and therefore has moved around a lot. Prior to being homeless he was in a sober living facility but left because he met a woman. Patient reports he has been off his medications (Paxil, Gabapentin, and Clonidine) for about 1 month because he cannot afford them. He currently endorses passive SI. He denies any prior attempts or self-harming behavior. Patient denies HI/AVH. He endorses daily alcohol use. He states he drinks 1 case of beer daily and had 6-8 beers earlier this date. Patient denies any other substance use.  Visit Diagnosis:   F33.2 MDD, recurrent, severe    F10.20 Alcohol use disorder, severe  Marvia Pickles, PMHNP recommends patient be admitted to [facility] for observation.  CCA Screening, Triage and Referral (STR)  Patient did not complete STR.   CCA Biopsychosocial  Intake/Chief Complaint:  CCA Intake With Chief Complaint CCA Part Two Date: 08/08/20 CCA Part Two Time: 5176 Chief Complaint/Presenting Problem: NA Patient's Currently Reported Symptoms/Problems: NA Individual's Strengths: NA Individual's Preferences: NA Individual's Abilities: NA Type of Services Patient Feels Are Needed: NA Initial Clinical Notes/Concerns: NA  Mental Health Symptoms Depression:  Depression: Change in energy/activity, Difficulty Concentrating, Fatigue, Hopelessness, Increase/decrease in appetite, Irritability, Sleep (too much or little), Tearfulness, Weight gain/loss, Worthlessness, Duration of symptoms greater than two weeks  Mania:  Mania: None  Anxiety:   Anxiety: Difficulty concentrating, Irritability, Restlessness, Worrying, Tension  Psychosis:  Psychosis: None  Trauma:  Trauma: None  Obsessions:  Obsessions: None  Compulsions:      Inattention:  Inattention: None  Hyperactivity/Impulsivity:  Hyperactivity/Impulsivity: N/A  Oppositional/Defiant Behaviors:  Oppositional/Defiant Behaviors: N/A  Emotional Irregularity:  Emotional Irregularity: N/A  Other Mood/Personality Symptoms:      Mental Status Exam Appearance and self-care  Stature:  Stature: Average  Weight:  Weight: Average weight  Clothing:  Clothing: Disheveled  Grooming:  Grooming: Neglected  Cosmetic use:  Cosmetic Use: None  Posture/gait:  Posture/Gait: Slumped  Motor activity:  Motor Activity: Not Remarkable  Sensorium  Attention:  Attention: Normal  Concentration:  Concentration: Normal  Orientation:  Orientation: X5  Recall/memory:  Recall/Memory: Normal  Affect and Mood  Affect:  Affect: Anxious  Mood:  Mood: Anxious  Relating  Eye contact:  Eye Contact: Normal  Facial expression:  Facial Expression: Anxious  Attitude toward examiner:  Attitude Toward Examiner: Cooperative  Thought and Language  Speech flow: Speech Flow: Clear and Coherent  Thought content:  Thought Content: Appropriate to Mood and Circumstances  Preoccupation:  Preoccupations: None  Hallucinations:  Hallucinations: None  Organization:     Transport planner of Knowledge:  Fund of Knowledge: Fair  Intelligence:  Intelligence: Average  Abstraction:  Abstraction: Normal  Judgement:  Judgement: Impaired  Reality Testing:  Reality Testing: Distorted  Insight:  Insight: Gaps  Decision Making:  Decision Making: Impulsive  Social Functioning  Social Maturity:  Social Maturity: Irresponsible  Social Judgement:  Social Judgement: Heedless  Stress  Stressors:  Stressors: Housing, Psychologist, clinical Ability:  Coping Ability: Research officer, political party Deficits:  Skill Deficits: None  Supports:  Supports: Support needed     Religion: Religion/Spirituality Are You A Religious Person?: No  Leisure/Recreation: Leisure / Recreation Do You Have Hobbies?:  No  Exercise/Diet: Exercise/Diet Do You Exercise?: No Have You Gained or Lost  A Significant Amount of Weight in the Past Six Months?: No Do You Follow a Special Diet?: No Do You Have Any Trouble Sleeping?: Yes Explanation of Sleeping Difficulties: reports little to no sleep   CCA Employment/Education  Employment/Work Situation: Employment / Work Situation Employment situation: Unemployed Patient's job has been impacted by current illness: No 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 Where was the patient employed at that time?: UTA Has patient ever been in the [employer]?: No  Education: Education Is Patient Currently Attending School?: No Last Grade Completed: 12 Did Teacher, adult education From [employer]?: Yes Did Physicist, medical?: No Did Heritage manager?: No Did You Have An Individualized Education Program (IIEP): No Did You Have Any Difficulty At [employer]?: No Patient's Education Has Been Impacted by Current Illness: No   CCA Family/Childhood History  Family and Relationship History: Family history Marital status: Single Are you sexually active?: Yes What is your sexual orientation?: heterosexual Has your sexual activity been affected by drugs, alcohol, medication, or emotional stress?: no Does patient have children?: No  Childhood History:  Childhood History By whom was/is the patient raised?: Mother Additional childhood history information: none provided Description of patient's relationship with caregiver when they were a child: "fine"; father alcohol Patient's description of current relationship with people who raised him/her: strained How were you disciplined when you got in trouble as a child/adolescent?: NA Does patient have siblings?: No Did patient suffer any verbal/emotional/physical/sexual abuse as a child?: No Did patient suffer from severe childhood neglect?: No Has patient ever been sexually abused/assaulted/raped as an adolescent  or adult?: No Was the patient ever a victim of a crime or a disaster?: No Witnessed domestic violence?: No Has patient been affected by domestic violence as an adult?: No  Child/Adolescent Assessment:     CCA Substance Use  Alcohol/Drug Use: Alcohol / Drug Use Pain Medications: see MAR Prescriptions: see MAR Over the Counter: see MAR History of alcohol / drug use?: Yes Substance #1 Name of Substance 1: Alcohol 1 - Age of First Use: UTA 1 - Amount (size/oz): 1 case 1 - Frequency: daily 1 - Duration: 1 year 1 - Last Use / Amount: this date 6-8 beers                       ASAM's:  Six Dimensions of Multidimensional Assessment  Dimension 1:  Acute Intoxication and/or Withdrawal Potential:      Dimension 2:  Biomedical Conditions and Complications:      Dimension 3:  Emotional, Behavioral, or Cognitive Conditions and Complications:     Dimension 4:  Readiness to Change:     Dimension 5:  Relapse, Continued use, or Continued Problem Potential:     Dimension 6:  Recovery/Living Environment:     ASAM Severity Score:    ASAM Recommended Level of Treatment:     Substance use Disorder (SUD)    Recommendations for Services/Supports/Treatments:    DSM5 Diagnoses: There are no problems to display for this patient.   Patient Centered Plan: Patient is on the following Treatment Plan(s):     Referrals to Alternative Service(s): Referred to Alternative Service(s):   Place:   Date:   Time:    Referred to Alternative Service(s):   Place:   Date:   Time:    Referred to Alternative Service(s):   Place:   Date:   Time:    Referred to Alternative Service(s):   Place:  Date:   Time:     Orvis Brill

## 2020-08-08 NOTE — ED Notes (Signed)
PATIENT BELONGINGS IN LOCKER #27

## 2020-08-08 NOTE — ED Notes (Signed)
Male called stating she was his wife and needed to talk to the patient. Stated she was outside the house and needed to know where to go to sleep. Pt given message and stated 'I don't want to get into that conversation right now'. Pt reassured he did not have to respond to the phone call if he did not want to.

## 2020-08-08 NOTE — ED Provider Notes (Signed)
Behavioral Health Admission H&P [facility] & OBS)  Date: 08/08/20 Patient Name: Carlos Flowers MRN: [id number] Chief Complaint: No chief complaint on file.     Diagnoses:  Final diagnoses:  None    HPI: Mr. Newby is a 39 year old, male who presented to the [facility] as walk-in with complaints of homelessness, increased anxiety, and alcohol abuse. He reported feeling increasingly anxious for one or two weeks. He stated that he is unable to function or think due to the increased level of anxiety. He denies suicidal thoughts, but stated that he "feel bad and would rather not deal with it." He denies HI. He denies AVH. He reported drinking a case of beer daily. His last alcoholic drink was today. He reported a past hx of alcohol withdrawal DT's in the past. He reported that he's been off his medications for one or two weeks and would like to be started back on his medications. He confirmed his most recent home medications to include Paxil 20 mg PO daily, Neurontin 600 mg QID PO, and Clonidine 0.1 mg PO at bedtime. He reported no longer taking Wellbutrin 450 mg PO daily, per chart review. He reported his most recent psychiatrist  provider to be [facility] in [facility]   Patient is alert and oriented x 4. He appears disheveled with a body odor. His speech is logical/coherent with norma rate and tone. He does not appear to be responding to internal or external stimuli.   We discussed the patient staying overnight at the [facility] for continuous observation and he agreed to the stated plan. We discussed re-starting his home medications to include Paxil 20 mg PO daily, Neurontin 400 mg TID PO, and Clonidine 0.1 mg PO at bedtime. Chart reviewed. CMP, CBC, TSH, UDS, ETOH, A1C and lipid panel ordered. Vital signs reviewed.   PHQ 2-9:     Total Time spent with patient: 45 minutes  Musculoskeletal  Strength & Muscle Tone: within normal limits Gait & Station: normal Patient leans: N/A  Psychiatric Specialty  Exam  Presentation General Appearance: Disheveled;Other (comment) (body odor)  Eye Contact:Fair  Speech:Clear and Coherent  Speech Volume:Normal  Handedness:Right   Mood and Affect  Mood:Anxious  Affect:Appropriate   Thought Process  Thought Processes:Coherent;Goal Directed  Descriptions of Associations:Intact  Orientation:Full (Time, Place and Person)  Thought Content:WDL  Hallucinations:Hallucinations: None  Ideas of Reference:None  Suicidal Thoughts:Suicidal Thoughts: No  Homicidal Thoughts:Homicidal Thoughts: No   Sensorium  Memory:Immediate Fair;Recent Fair;Remote Fair  Judgment:Fair  Insight:Fair   Executive Functions  Concentration:Fair  Attention Span:Fair  Recall:Fair  Fund of Knowledge:Fair  Language:Fair   Psychomotor Activity  Psychomotor Activity:Psychomotor Activity: Normal   Assets  Assets:Desire for Improvement;Physical Health;Leisure Time   Sleep  Sleep:Sleep: Fair   Physical Exam Vitals and nursing note reviewed.  Constitutional:      Appearance: He is well-developed.  Cardiovascular:     Rate and Rhythm: Tachycardia present.  Pulmonary:     Effort: Pulmonary effort is normal.  Musculoskeletal:        General: Normal range of motion.  Skin:    General: Skin is warm.  Neurological:     Mental Status: He is alert and oriented to person, place, and time.    Review of Systems  Constitutional: Negative.   HENT: Negative.   Eyes: Negative.   Respiratory: Negative.   Cardiovascular: Negative.   Gastrointestinal: Negative.   Genitourinary: Negative.   Musculoskeletal: Negative.   Skin: Negative.   Neurological: Negative.   Endo/Heme/Allergies:  Negative.   Psychiatric/Behavioral: Positive for substance abuse. The patient is nervous/anxious.     Blood pressure 116/80, pulse (!) 109, temperature 97.8 F (36.6 C), temperature source Temporal, resp. rate 18, height 6\' 1"  (1.854 m), weight 197 lb (89.4 kg), SpO2  96 %. Body mass index is 25.99 kg/m.  Past Psychiatric History: MDD, social anxiety   Is the patient at risk to self? No  Has the patient been a risk to self in the past 6 months? No .    Has the patient been a risk to self within the distant past? No   Is the patient a risk to others? No   Has the patient been a risk to others in the past 6 months? No   Has the patient been a risk to others within the distant past? No   Past Medical History:  Past Medical History:  Diagnosis Date  . Anxiety   . Depression   . Substance abuse (HCC)    No past surgical history on file.  Family History:  Family History  Problem Relation Age of Onset  . Cancer Maternal Grandmother   . Heart disease Paternal Grandmother   . Heart disease Paternal Grandfather     Social History:  Social History   Socioeconomic History  . Marital status: Single    Spouse name: Not on file  . Number of children: Not on file  . Years of education: Not on file  . Highest education level: Not on file  Occupational History  . Not on file  Tobacco Use  . Smoking status: Current Every Day Smoker    Packs/day: 1.00    Years: 17.00    Pack years: 17.00    Types: Cigarettes  . Smokeless tobacco: Never Used  Vaping Use  . Vaping Use: Never used  Substance and Sexual Activity  . Alcohol use: Not Currently    Comment: none sinc 1/16/120  . Drug use: Not Currently    Types: Cocaine, Heroin    Comment: last use 12/17/2018  . Sexual activity: Not on file  Other Topics Concern  . Not on file  Social History Narrative  . Not on file   Social Determinants of Health   Financial Resource Strain:   . Difficulty of Paying Living Expenses: Not on file  Food Insecurity:   . Worried About 12/19/2018 in the Last Year: Not on file  . Ran Out of Food in the Last Year: Not on file  Transportation Needs:   . Lack of Transportation (Medical): Not on file  . Lack of Transportation (Non-Medical): Not on file   Physical Activity:   . Days of Exercise per Week: Not on file  . Minutes of Exercise per Session: Not on file  Stress:   . Feeling of Stress : Not on file  Social Connections:   . Frequency of Communication with Friends and Family: Not on file  . Frequency of Social Gatherings with Friends and Family: Not on file  . Attends Religious Services: Not on file  . Active Member of Clubs or Organizations: Not on file  . Attends Programme researcher, broadcasting/film/video Meetings: Not on file  . Marital Status: Not on file  Intimate Partner Violence:   . Fear of Current or Ex-Partner: Not on file  . Emotionally Abused: Not on file  . Physically Abused: Not on file  . Sexually Abused: Not on file    SDOH:  SDOH Screenings   Alcohol Screen:   .  Last Alcohol Screening Score (AUDIT): Not on file  Depression (PHQ2-9):   . PHQ-2 Score: Not on file  Financial Resource Strain:   . Difficulty of Paying Living Expenses: Not on file  Food Insecurity:   . Worried About Programme researcher, broadcasting/film/video in the Last Year: Not on file  . Ran Out of Food in the Last Year: Not on file  Housing:   . Last Housing Risk Score: Not on file  Physical Activity:   . Days of Exercise per Week: Not on file  . Minutes of Exercise per Session: Not on file  Social Connections:   . Frequency of Communication with Friends and Family: Not on file  . Frequency of Social Gatherings with Friends and Family: Not on file  . Attends Religious Services: Not on file  . Active Member of Clubs or Organizations: Not on file  . Attends Banker Meetings: Not on file  . Marital Status: Not on file  Stress:   . Feeling of Stress : Not on file  Tobacco Use:   . Smoking Tobacco Use: Not on file  . Smokeless Tobacco Use: Not on file  Transportation Needs:   . Lack of Transportation (Medical): Not on file  . Lack of Transportation (Non-Medical): Not on file    Last Labs:  No visits with results within 6 Month(s) from this visit.  Latest  known visit with results is:  Lab on 11/23/2019  Component Date Value Ref Range Status  . SARS-CoV-2, NAA 11/23/2019 Not Detected  Not Detected Final   Comment: This nucleic acid amplification test was developed and its performance characteristics determined by [employer]. Nucleic acid amplification tests include PCR and TMA. This test has not been FDA cleared or approved. This test has been authorized by FDA under an Emergency Use Authorization (EUA). This test is only authorized for the duration of time the declaration that circumstances exist justifying the authorization of the emergency use of in vitro diagnostic tests for detection of SARS-CoV-2 virus and/or diagnosis of COVID-19 infection under section 564(b)(1) of the Act, 21 U.S.C. 510CHE-5(I) (1), unless the authorization is terminated or revoked sooner. When diagnostic testing is negative, the possibility of a false negative result should be considered in the context of a patient's recent exposures and the presence of clinical signs and symptoms consistent with COVID-19. An individual without symptoms of COVID-19 and who is not shedding SARS-CoV-2 virus would                           expect to have a negative (not detected) result in this assay.     Allergies: Peanut-containing drug products  PTA Medications: (Not in a hospital admission)   Medical Decision Making  We discussed the patient staying overnight at the [facility] for continuous observation. We discussed re-starting his home medications to include Paxil 20 mg PO daily, Neurontin 400 mg TID PO, and Clonidine 0.1 mg PO at bedtime. CMP, CBC, TSH, UDS, ETOH, A1C and lipid panel ordered.     Recommendations  Based on my evaluation the patient does not appear to have an emergency medical condition.  Gerlene Burdock Delecia Vastine, FNP 08/08/20  3:25 PM

## 2020-08-08 NOTE — ED Notes (Signed)
STAT labs added to bag awaiting on pick up from lab from earlier STAT lab call

## 2020-08-08 NOTE — ED Notes (Signed)
Pt brought to unit.  A&O x4. SI endorsed with no plan. Calm and cooperative. Oriented to unit and staff. Will continue to monitor for safety

## 2020-08-09 MED ORDER — HYDROXYZINE HCL 25 MG PO TABS: 25.0000 mg | ORAL_TABLET | Freq: Four times a day (QID) | ORAL | 0 refills | Status: DC | PRN

## 2020-08-09 MED ORDER — CLONIDINE HCL 0.1 MG PO TABS
0.1000 mg | ORAL_TABLET | Freq: Every day | ORAL | 0 refills | Status: DC
Start: 2020-08-09 — End: 2021-03-27

## 2020-08-09 MED ORDER — PAROXETINE HCL 20 MG PO TABS
20.0000 mg | ORAL_TABLET | Freq: Every day | ORAL | 0 refills | Status: DC
Start: 2020-08-10 — End: 2021-03-27

## 2020-08-09 MED ORDER — NICOTINE POLACRILEX 2 MG MT GUM: 2.0000 mg | CHEWING_GUM | OROMUCOSAL | 0 refills | Status: DC | PRN

## 2020-08-09 MED ORDER — GABAPENTIN 400 MG PO CAPS
400.0000 mg | ORAL_CAPSULE | Freq: Three times a day (TID) | ORAL | 0 refills | Status: DC
Start: 2020-08-09 — End: 2021-03-22

## 2020-08-09 NOTE — ED Provider Notes (Signed)
Behavioral Health Progress Note  Date and Time: 08/09/2020 2:05 PM Name: Carlos Flowers MRN:  [id number]  Subjective: Patient is a 39 year old male that presented with passive suicidal ideations and reporting alcohol abuse.  Today patient is reporting that he feels better that he got some good sleep last night states that the medications that were started on him last night have been helping.  Patient reports that the Paxil 20 mg p.o. daily, Neurontin 400 mg p.o. 3 times daily and clonidine 0.1 mg p.o. nightly greatly assist him with improving his mood and sleep.  Patient denies any significant withdrawal symptoms.  Patient reports that he is interested in going to residential treatment or to a detox bed.  Patient denies any suicidal or homicidal ideations and denies any hallucinations today.  Patient does report that if he is discharged today to the street he will more than likely go back to drinking and that is what makes him feel depressed and have suicidal thoughts.  Objective: Patient presents to the continuous observation unit overnight.  Patient is pleasant, calm, cooperative.  Patient has denied any suicidal homicidal ideations and denies any hallucinations.  Social work is assisting in trying to get patient into a residential treatment.  They are still waiting on [facility] to answer back if patient can be admitted tomorrow morning.  Patient is agreed that if his plan fails that he would like to be discharged tomorrow morning to be sent directly to [facility] in [place] for their detox program.  Diagnosis:  Final diagnoses:  None    Total Time spent with patient: 20 minutes  Past Psychiatric History: Anxiety, depression, alcohol abuse Past Medical History:  Past Medical History:  Diagnosis Date  . Anxiety   . Depression   . Substance abuse (HCC)    No past surgical history on file. Family History:  Family History  Problem Relation Age of Onset  . Cancer Maternal Grandmother   .  Heart disease Paternal Grandmother   . Heart disease Paternal Grandfather    Family Psychiatric  History: None reported Social History:  Social History   Substance and Sexual Activity  Alcohol Use Not Currently   Comment: none sinc 1/16/120     Social History   Substance and Sexual Activity  Drug Use Not Currently  . Types: Cocaine, Heroin   Comment: last use 12/17/2018    Social History   Socioeconomic History  . Marital status: Single    Spouse name: Not on file  . Number of children: Not on file  . Years of education: Not on file  . Highest education level: Not on file  Occupational History  . Not on file  Tobacco Use  . Smoking status: Current Every Day Smoker    Packs/day: 1.00    Years: 17.00    Pack years: 17.00    Types: Cigarettes  . Smokeless tobacco: Never Used  Vaping Use  . Vaping Use: Never used  Substance and Sexual Activity  . Alcohol use: Not Currently    Comment: none sinc 1/16/120  . Drug use: Not Currently    Types: Cocaine, Heroin    Comment: last use 12/17/2018  . Sexual activity: Not on file  Other Topics Concern  . Not on file  Social History Narrative  . Not on file   Social Determinants of Health   Financial Resource Strain:   . Difficulty of Paying Living Expenses: Not on file  Food Insecurity:   . Worried About Radiation protection practitioner  of Food in the Last Year: Not on file  . Ran Out of Food in the Last Year: Not on file  Transportation Needs:   . Lack of Transportation (Medical): Not on file  . Lack of Transportation (Non-Medical): Not on file  Physical Activity:   . Days of Exercise per Week: Not on file  . Minutes of Exercise per Session: Not on file  Stress:   . Feeling of Stress : Not on file  Social Connections:   . Frequency of Communication with Friends and Family: Not on file  . Frequency of Social Gatherings with Friends and Family: Not on file  . Attends Religious Services: Not on file  . Active Member of Clubs or  Organizations: Not on file  . Attends Banker Meetings: Not on file  . Marital Status: Not on file   SDOH:  SDOH Screenings   Alcohol Screen:   . Last Alcohol Screening Score (AUDIT): Not on file  Depression (PHQ2-9):   . PHQ-2 Score: Not on file  Financial Resource Strain:   . Difficulty of Paying Living Expenses: Not on file  Food Insecurity:   . Worried About Programme researcher, broadcasting/film/video in the Last Year: Not on file  . Ran Out of Food in the Last Year: Not on file  Housing:   . Last Housing Risk Score: Not on file  Physical Activity:   . Days of Exercise per Week: Not on file  . Minutes of Exercise per Session: Not on file  Social Connections:   . Frequency of Communication with Friends and Family: Not on file  . Frequency of Social Gatherings with Friends and Family: Not on file  . Attends Religious Services: Not on file  . Active Member of Clubs or Organizations: Not on file  . Attends Banker Meetings: Not on file  . Marital Status: Not on file  Stress:   . Feeling of Stress : Not on file  Tobacco Use:   . Smoking Tobacco Use: Not on file  . Smokeless Tobacco Use: Not on file  Transportation Needs:   . Lack of Transportation (Medical): Not on file  . Lack of Transportation (Non-Medical): Not on file   Additional Social History:    Pain Medications: see MAR Prescriptions: see MAR Over the Counter: see MAR History of alcohol / drug use?: Yes Name of Substance 1: Alcohol 1 - Age of First Use: UTA 1 - Amount (size/oz): 1 case 1 - Frequency: daily 1 - Duration: 1 year 1 - Last Use / Amount: this date 6-8 beers                  Sleep: Good  Appetite:  Good  Current Medications:  Current Facility-Administered Medications  Medication Dose Route Frequency Provider Last Rate Last Admin  . acetaminophen (TYLENOL) tablet 650 mg  650 mg Oral Q6H PRN Loral Campi, Gerlene Burdock, FNP   650 mg at 08/09/20 0756  . alum & mag hydroxide-simeth  (MAALOX/MYLANTA) 200-200-20 MG/5ML suspension 30 mL  30 mL Oral Q4H PRN Tarri Guilfoil, Feliz Beam B, FNP      . cloNIDine (CATAPRES) tablet 0.1 mg  0.1 mg Oral QHS Steadman Prosperi, Feliz Beam B, FNP   0.1 mg at 08/08/20 2111  . gabapentin (NEURONTIN) capsule 400 mg  400 mg Oral TID Trinna Kunst, Gerlene Burdock, FNP   400 mg at 08/09/20 0955  . hydrOXYzine (ATARAX/VISTARIL) tablet 25 mg  25 mg Oral Q6H PRN Norrine Ballester, Gerlene Burdock, FNP  25 mg at 08/09/20 1323  . loperamide (IMODIUM) capsule 2-4 mg  2-4 mg Oral PRN Sheran Newstrom, Gerlene Burdockravis B, FNP      . LORazepam (ATIVAN) tablet 1 mg  1 mg Oral Q6H PRN Tradarius Reinwald, Gerlene Burdockravis B, FNP   1 mg at 08/09/20 0756  . magnesium hydroxide (MILK OF MAGNESIA) suspension 30 mL  30 mL Oral Daily PRN Tacey Dimaggio, Gerlene Burdockravis B, FNP      . multivitamin with minerals tablet 1 tablet  1 tablet Oral Daily Gershom Brobeck, Gerlene Burdockravis B, FNP   1 tablet at 08/09/20 0955  . nicotine polacrilex (NICORETTE) gum 2 mg  2 mg Oral PRN Nira ConnBerry, Jason A, NP   2 mg at 08/09/20 1325  . ondansetron (ZOFRAN-ODT) disintegrating tablet 4 mg  4 mg Oral Q6H PRN Aidenjames Heckmann, Gerlene Burdockravis B, FNP   4 mg at 08/09/20 0756  . PARoxetine (PAXIL) tablet 20 mg  20 mg Oral Daily Brinae Woods, Gerlene Burdockravis B, FNP   20 mg at 08/09/20 0955  . thiamine tablet 100 mg  100 mg Oral Daily Login Muckleroy, Gerlene Burdockravis B, FNP   100 mg at 08/09/20 0955  . traZODone (DESYREL) tablet 50 mg  50 mg Oral QHS PRN Charmeka Freeburg, Gerlene Burdockravis B, FNP       Current Outpatient Medications  Medication Sig Dispense Refill  . buPROPion (WELLBUTRIN XL) 300 MG 24 hr tablet Take 450 mg by mouth daily.    . cloNIDine (CATAPRES) 0.1 MG tablet Take 0.1 mg by mouth 2 (two) times daily.    Marland Kitchen. gabapentin (NEURONTIN) 300 MG capsule Take 600 mg by mouth 3 (three) times daily.    Marland Kitchen. PARoxetine (PAXIL) 20 MG tablet Take 20 mg by mouth once.      Labs  Lab Results:  Admission on 08/08/2020  Component Date Value Ref Range Status  . SARS Coronavirus 2 08/08/2020 NEGATIVE  NEGATIVE Final   Comment: (NOTE) SARS-CoV-2 target nucleic acids are NOT DETECTED.  The SARS-CoV-2 RNA  is generally detectable in upper and lower respiratory specimens during the acute phase of infection. The lowest concentration of SARS-CoV-2 viral copies this assay can detect is 250 copies / mL. A negative result does not preclude SARS-CoV-2 infection and should not be used as the sole basis for treatment or other patient management decisions.  A negative result may occur with improper specimen collection / handling, submission of specimen other than nasopharyngeal swab, presence of viral mutation(s) within the areas targeted by this assay, and inadequate number of viral copies (<250 copies / mL). A negative result must be combined with clinical observations, patient history, and epidemiological information.  Fact Sheet for Patients:   BoilerBrush.com.cyhttps://www.fda.gov/media/136312/download  Fact Sheet for Healthcare Providers: https://pope.com/https://www.fda.gov/media/136313/download  This test is not yet approved or                           cleared by the [place] FDA and has been authorized for detection and/or diagnosis of SARS-CoV-2 by FDA under an Emergency Use Authorization (EUA).  This EUA will remain in effect (meaning this test can be used) for the duration of the COVID-19 declaration under Section 564(b)(1) of the Act, 21 U.S.C. section 360bbb-3(b)(1), unless the authorization is terminated or revoked sooner.  Performed at [facility], [address] [place], [place] [address]   . WBC 08/08/2020 10.1  4.0 - 10.5 K/uL Final  . RBC 08/08/2020 5.27  4.22 - 5.81 MIL/uL Final  . Hemoglobin 08/08/2020 16.3  13.0 -  17.0 g/dL Final  . HCT 70/17/7939 48.8  39 - 52 % Final  . MCV 08/08/2020 92.6  80.0 - 100.0 fL Final  . MCH 08/08/2020 30.9  26.0 - 34.0 pg Final  . MCHC 08/08/2020 33.4  30.0 - 36.0 g/dL Final  . RDW 03/00/9233 14.2  11.5 - 15.5 % Final  . Platelets 08/08/2020 253  150 - 400 K/uL Final  . nRBC 08/08/2020 0.0  0.0 - 0.2 % Final  . Neutrophils Relative % 08/08/2020 62  % Final   . Neutro Abs 08/08/2020 6.4  1.7 - 7.7 K/uL Final  . Lymphocytes Relative 08/08/2020 28  % Final  . Lymphs Abs 08/08/2020 2.8  0.7 - 4.0 K/uL Final  . Monocytes Relative 08/08/2020 7  % Final  . Monocytes Absolute 08/08/2020 0.7  0 - 1 K/uL Final  . Eosinophils Relative 08/08/2020 1  % Final  . Eosinophils Absolute 08/08/2020 0.1  0 - 0 K/uL Final  . Basophils Relative 08/08/2020 1  % Final  . Basophils Absolute 08/08/2020 0.1  0 - 0 K/uL Final  . Immature Granulocytes 08/08/2020 1  % Final  . Abs Immature Granulocytes 08/08/2020 0.06  0.00 - 0.07 K/uL Final   Performed at [facility], [address], [place], [place] [address]  . Sodium 08/08/2020 140  135 - 145 mmol/L Final  . Potassium 08/08/2020 4.0  3.5 - 5.1 mmol/L Final  . Chloride 08/08/2020 104  98 - 111 mmol/L Final  . CO2 08/08/2020 25  22 - 32 mmol/L Final  . Glucose, Bld 08/08/2020 90  70 - 99 mg/dL Final   Glucose reference range applies only to samples taken after fasting for at least 8 hours.  . BUN 08/08/2020 <5* 6 - 20 mg/dL Final  . Creatinine, Ser 08/08/2020 0.86  0.61 - 1.24 mg/dL Final  . Calcium 26/33/3545 9.3  8.9 - 10.3 mg/dL Final  . Total Protein 08/08/2020 7.9  6.5 - 8.1 g/dL Final  . Albumin 62/56/3893 4.6  3.5 - 5.0 g/dL Final  . AST 73/42/8768 19  15 - 41 U/L Final  . ALT 08/08/2020 18  0 - 44 U/L Final  . Alkaline Phosphatase 08/08/2020 101  38 - 126 U/L Final  . Total Bilirubin 08/08/2020 1.1  0.3 - 1.2 mg/dL Final  . GFR calc non Af Amer 08/08/2020 >60  >60 mL/min Final  . GFR calc Af Amer 08/08/2020 >60  >60 mL/min Final  . Anion gap 08/08/2020 11  5 - 15 Final   Performed at [facility], [address], [place], [place] [address]  . Hgb A1c MFr Bld 08/08/2020 5.5  4.8 - 5.6 % Final   Comment: (NOTE) Pre diabetes:          5.7%-6.4%  Diabetes:              >6.4%  Glycemic control for   <7.0% adults with diabetes   . Mean Plasma Glucose 08/08/2020 111.15  mg/dL Final    Performed at [facility], [address] [place], [place] [address]  . Alcohol, Ethyl (B) 08/08/2020 113* <10 mg/dL Final   Comment: (NOTE) Lowest detectable limit for serum alcohol is 10 mg/dL.  For medical purposes only. Performed at [facility], [address] [place], [place] [address]   . Cholesterol 08/08/2020 129  0 - 200 mg/dL Final  . Triglycerides 08/08/2020 111  <150 mg/dL Final  . HDL 63/84/5364 45  >  40 mg/dL Final  . Total CHOL/HDL Ratio 08/08/2020 2.9  RATIO Final  . VLDL 08/08/2020 22  0 - 40 mg/dL Final  . LDL Cholesterol 08/08/2020 62  0 - 99 mg/dL Final   Comment:        Total Cholesterol/HDL:CHD Risk Coronary Heart Disease Risk Table                     Men   Women  1/2 Average Risk   3.4   3.3  Average Risk       5.0   4.4  2 X Average Risk   9.6   7.1  3 X Average Risk  23.4   11.0        Use the calculated Patient Ratio above and the CHD Risk Table to determine the patient's CHD Risk.        ATP III CLASSIFICATION (LDL):  <100     mg/dL   Optimal  161-096  mg/dL   Near or Above                    Optimal  130-159  mg/dL   Borderline  045-409  mg/dL   High  >811     mg/dL   Very High Performed at [facility], [address] [place], [place] [address]   . TSH 08/08/2020 1.590  0.350 - 4.500 uIU/mL Final   Comment: Performed by a 3rd Generation assay with a functional sensitivity of <=0.01 uIU/mL. Performed at [facility], [address] [place], [place] [address]   . POC Amphetamine UR 08/08/2020 None Detected  None Detected Final  . POC Secobarbital (BAR) 08/08/2020 None Detected  None Detected Final  . POC Buprenorphine (BUP) 08/08/2020 None Detected  None Detected Final  . POC Oxazepam (BZO) 08/08/2020 None Detected  None Detected Final  . POC Cocaine UR 08/08/2020 None Detected  None Detected Final  . POC Methamphetamine UR 08/08/2020 None Detected  None Detected Final  . POC Morphine 08/08/2020 None Detected  None  Detected Final  . POC Oxycodone UR 08/08/2020 None Detected  None Detected Final  . POC Methadone UR 08/08/2020 None Detected  None Detected Final  . POC Marijuana UR 08/08/2020 None Detected  None Detected Final  . SARS Coronavirus 2 Ag 08/08/2020 Negative  Negative Final  . SARS Coronavirus 2 Ag 08/08/2020 NEGATIVE  NEGATIVE Final   Comment: (NOTE) SARS-CoV-2 antigen NOT DETECTED.   Negative results are presumptive.  Negative results do not preclude SARS-CoV-2 infection and should not be used as the sole basis for treatment or other patient management decisions, including infection  control decisions, particularly in the presence of clinical signs and  symptoms consistent with COVID-19, or in those who have been in contact with the virus.  Negative results must be combined with clinical observations, patient history, and epidemiological information. The expected result is Negative.  Fact Sheet for Patients: https://sanders-williams.net/  Fact Sheet for Healthcare Providers: https://martinez.com/   This test is not yet approved or cleared by the [place] FDA and  has been authorized for detection and/or diagnosis of SARS-CoV-2 by FDA under an Emergency Use Authorization (EUA).  This EUA will remain in effect (meaning this test can be used) for the duration of  the C                          OVID-19 declaration under Section  564(b)(1) of the Act, 21 U.S.C. section 360bbb-3(b)(1), unless the authorization is terminated or revoked sooner.      Blood Alcohol level:  Lab Results  Component Value Date   ETH 113 (H) 08/08/2020   ETH <10 12/24/2018    Metabolic Disorder Labs: Lab Results  Component Value Date   HGBA1C 5.5 08/08/2020   MPG 111.15 08/08/2020   No results found for: PROLACTIN Lab Results  Component Value Date   CHOL 129 08/08/2020   TRIG 111 08/08/2020   HDL 45 08/08/2020   CHOLHDL 2.9 08/08/2020   VLDL 22 08/08/2020    LDLCALC 62 08/08/2020    Therapeutic Lab Levels: No results found for: LITHIUM No results found for: VALPROATE No components found for:  CBMZ  Physical Findings     Musculoskeletal  Strength & Muscle Tone: within normal limits Gait & Station: normal Patient leans: N/A  Psychiatric Specialty Exam  Presentation  General Appearance: Appropriate for Environment;Casual  Eye Contact:Good  Speech:Clear and Coherent;Normal Rate  Speech Volume:Normal  Handedness:Right   Mood and Affect  Mood:Depressed  Affect:Appropriate;Congruent   Thought Process  Thought Processes:Coherent  Descriptions of Associations:Intact  Orientation:Full (Time, Place and Person)  Thought Content:WDL  Hallucinations:Hallucinations: None  Ideas of Reference:None  Suicidal Thoughts:Suicidal Thoughts: No  Homicidal Thoughts:Homicidal Thoughts: No   Sensorium  Memory:Immediate Good;Recent Good;Remote Good  Judgment:Fair  Insight:Fair   Executive Functions  Concentration:Good  Attention Span:Good  Recall:Good  Fund of Knowledge:Good  Language:Good   Psychomotor Activity  Psychomotor Activity:Psychomotor Activity: Normal   Assets  Assets:Communication Skills;Desire for Improvement   Sleep  Sleep:Sleep: Good   Physical Exam  Physical Exam Vitals and nursing note reviewed.  Constitutional:      Appearance: He is well-developed.  Cardiovascular:     Rate and Rhythm: Normal rate.  Pulmonary:     Effort: Pulmonary effort is normal.  Musculoskeletal:        General: Normal range of motion.  Skin:    General: Skin is warm.  Neurological:     Mental Status: He is alert and oriented to person, place, and time.    Review of Systems  Constitutional: Negative.   HENT: Negative.   Eyes: Negative.   Respiratory: Negative.   Cardiovascular: Negative.   Gastrointestinal: Negative.   Genitourinary: Negative.   Musculoskeletal: Negative.   Skin: Negative.    Neurological: Negative.   Endo/Heme/Allergies: Negative.   Psychiatric/Behavioral: Negative.    Blood pressure (!) 125/93, pulse 97, temperature 97.7 F (36.5 C), resp. rate 18, height  (1.854 m), weight 197 lb (89.4 kg), SpO2 98 %. Body mass index is 25.99 kg/m.  Treatment Plan Summary: Continue current medications and waiting for acceptance to [facility] program  Maryfrances Bunnell, [place] 08/09/2020 2:05 PM

## 2020-08-09 NOTE — ED Notes (Signed)
[name] wife called the third time with this message to be given to her husband Carlos Flowers, that, she is going back leaving Carlos Flowers this morning back to [place], she loves him and wants him to call her at [phone]. Wife's name is Carlos Flowers.

## 2020-08-09 NOTE — Care Management (Signed)
Writer spoke to Carlos Flowers at  [facility], [address] [place], [place] [address], [phone] and was informed that the patient has been accepted ans he is able to come tomorrow on 08-10-2020 at 9am.  Writer informed the RN and NP working with the patient.

## 2020-08-09 NOTE — ED Notes (Signed)
Pt asleep with even and unlabored respirations. No distress or discomfort noted. Pt remains safe on the unit. Will continue to monitor. 

## 2020-08-09 NOTE — ED Notes (Signed)
Patient does not want breakfast only drinking coffee, patient cooperative

## 2020-08-09 NOTE — ED Notes (Signed)
Pt given sandwich and chips for dinner. 

## 2020-08-09 NOTE — ED Notes (Signed)
Please deliver message to [place] when he wakes up from sleep.

## 2020-08-09 NOTE — Progress Notes (Signed)
CSW made contact with June at  [facility], [address] [place], [place] [address], [phone] to inquire about available beds.  CSW was advised that beds are available and requested patient information be sent for review.    Information faxed to be reviewed.  If accepted patient would be able be transported in the morning for intake.  Ladoris Gene MSW,LCSWA,LCASA Disposition, CSW [phone] (cell)

## 2020-08-09 NOTE — ED Notes (Signed)
Male called stating her name was 'Carlos Flowers' and she was wife of pt. Complained that she had called previous and pt never returned her call. RN informed Carlos Flowers that due to HIPAA we could not give her any information but that if the pt was here the message had been delivered. RN then gently reminded her that we can not force anyone to return calls, all we can do is pass on the message. At this time pt appears to be sleeping and with his refusal to return previous call RN opted to not wake & inform pt at this time of second call. Will alert pt when he wakens.

## 2020-08-10 MED ORDER — NICOTINE 21 MG/24HR TD PT24
21.0000 mg | MEDICATED_PATCH | Freq: Every day | TRANSDERMAL | Status: DC
  Filled 2020-08-10: qty 14
  Filled 2020-08-10: qty 1

## 2020-08-10 NOTE — ED Provider Notes (Signed)
FBC/OBS ASAP Discharge Summary  Date and Time: 08/10/2020 7:18 AM  Name: Carlos Flowers  MRN:  [id number]   Discharge Diagnoses:  Final diagnoses:  Severe episode of recurrent major depressive disorder, without psychotic features (HCC)  Alcohol dependence with uncomplicated withdrawal (HCC)    Subjective: Patient presents this morning reporting that he is feeling better.  Patient denies any suicidal homicidal ideations and denies any hallucinations.  Patient states that he is ready to go to residential treatment for substance abuse.  Patient is requesting to have nicotine patches provided to him to assist with his smoking cessation since he has been told that he is not allowed to smoke there.  Patient reports that he slept well last night and has no complaints this morning.  Stay Summary: Patient is a 39 year old male that presented to the [facility] as a walk-in with complaints of unwellness, increased anxiety, and alcohol abuse.  Patient was admitted to the continuous observation unit and restarted on his home medications of Paxil, Neurontin, and clonidine.  Patient was also placed on Ativan as needed detox protocol.  Patient was requesting to go to [facility] residential for substance abuse treatment.  Patient remained on the continuous observation unit for 2 days and was [facility] will be [facility] this morning.  Patient was provided with 14-day samples of his medications as well as prescriptions for his medications.  Patient has continued to deny any suicidal homicidal ideations and denies any hallucinations.  [facility] be transported to [facility] via safe transport.  Total Time spent with patient: 30 minutes  Past Psychiatric History: Anxiety, depression, alcohol abuse Past Medical History:  Past Medical History:  Diagnosis Date  . Anxiety   . Depression   . Substance abuse (HCC)    No past surgical history on file. Family History:  Family History   Problem Relation Age of Onset  . Cancer Maternal Grandmother   . Heart disease Paternal Grandmother   . Heart disease Paternal Grandfather    Family Psychiatric History: None reported Social History:  Social History   Substance and Sexual Activity  Alcohol Use Not Currently   Comment: none sinc 1/16/120     Social History   Substance and Sexual Activity  Drug Use Not Currently  . Types: Cocaine, Heroin   Comment: last use 12/17/2018    Social History   Socioeconomic History  . Marital status: Single    Spouse name: Not on file  . Number of children: Not on file  . Years of education: Not on file  . Highest education level: Not on file  Occupational History  . Not on file  Tobacco Use  . Smoking status: Current Every Day Smoker    Packs/day: 1.00    Years: 17.00    Pack years: 17.00    Types: Cigarettes  . Smokeless tobacco: Never Used  Vaping Use  . Vaping Use: Never used  Substance and Sexual Activity  . Alcohol use: Not Currently    Comment: none sinc 1/16/120  . Drug use: Not Currently    Types: Cocaine, Heroin    Comment: last use 12/17/2018  . Sexual activity: Not on file  Other Topics Concern  . Not on file  Social History Narrative  . Not on file   Social Determinants of Health   Financial Resource Strain:   . Difficulty of Paying Living Expenses: Not on file  Food Insecurity:   . Worried About Programme researcher, broadcasting/film/video  in the Last Year: Not on file  . Ran Out of Food in the Last Year: Not on file  Transportation Needs:   . Lack of Transportation (Medical): Not on file  . Lack of Transportation (Non-Medical): Not on file  Physical Activity:   . Days of Exercise per Week: Not on file  . Minutes of Exercise per Session: Not on file  Stress:   . Feeling of Stress : Not on file  Social Connections:   . Frequency of Communication with Friends and Family: Not on file  . Frequency of Social Gatherings with Friends and Family: Not on file  . Attends  Religious Services: Not on file  . Active Member of Clubs or Organizations: Not on file  . Attends Banker Meetings: Not on file  . Marital Status: Not on file   SDOH:  SDOH Screenings   Alcohol Screen:   . Last Alcohol Screening Score (AUDIT): Not on file  Depression (PHQ2-9):   . PHQ-2 Score: Not on file  Financial Resource Strain:   . Difficulty of Paying Living Expenses: Not on file  Food Insecurity:   . Worried About Programme researcher, broadcasting/film/video in the Last Year: Not on file  . Ran Out of Food in the Last Year: Not on file  Housing:   . Last Housing Risk Score: Not on file  Physical Activity:   . Days of Exercise per Week: Not on file  . Minutes of Exercise per Session: Not on file  Social Connections:   . Frequency of Communication with Friends and Family: Not on file  . Frequency of Social Gatherings with Friends and Family: Not on file  . Attends Religious Services: Not on file  . Active Member of Clubs or Organizations: Not on file  . Attends Banker Meetings: Not on file  . Marital Status: Not on file  Stress:   . Feeling of Stress : Not on file  Tobacco Use:   . Smoking Tobacco Use: Not on file  . Smokeless Tobacco Use: Not on file  Transportation Needs:   . Lack of Transportation (Medical): Not on file  . Lack of Transportation (Non-Medical): Not on file    Has this patient used any form of tobacco in the last 30 days? (Cigarettes, Smokeless Tobacco, Cigars, and/or Pipes) A prescription for an FDA-approved tobacco cessation medication was offered at discharge and the patient refused  Current Medications:  Current Facility-Administered Medications  Medication Dose Route Frequency Provider Last Rate Last Admin  . acetaminophen (TYLENOL) tablet 650 mg  650 mg Oral Q6H PRN Babs Dabbs, Gerlene Burdock, FNP   650 mg at 08/09/20 0756  . alum & mag hydroxide-simeth (MAALOX/MYLANTA) 200-200-20 MG/5ML suspension 30 mL  30 mL Oral Q4H PRN Jerell Demery, Feliz Beam B, FNP       . cloNIDine (CATAPRES) tablet 0.1 mg  0.1 mg Oral QHS Algenis Ballin, Feliz Beam B, FNP   0.1 mg at 08/09/20 2121  . gabapentin (NEURONTIN) capsule 400 mg  400 mg Oral TID Krishawn Vanderweele, Gerlene Burdock, FNP   400 mg at 08/09/20 2120  . hydrOXYzine (ATARAX/VISTARIL) tablet 25 mg  25 mg Oral Q6H PRN Ressie Slevin, Gerlene Burdock, FNP   25 mg at 08/09/20 1323  . loperamide (IMODIUM) capsule 2-4 mg  2-4 mg Oral PRN Jafari Mckillop, Gerlene Burdock, FNP      . LORazepam (ATIVAN) tablet 1 mg  1 mg Oral Q6H PRN Amaya Blakeman, Gerlene Burdock, FNP   1 mg at 08/09/20 2120  .  magnesium hydroxide (MILK OF MAGNESIA) suspension 30 mL  30 mL Oral Daily PRN Samyuktha Brau, Gerlene Burdockravis B, FNP      . multivitamin with minerals tablet 1 tablet  1 tablet Oral Daily Thora Scherman, Gerlene Burdockravis B, FNP   1 tablet at 08/09/20 0955  . nicotine polacrilex (NICORETTE) gum 2 mg  2 mg Oral PRN Nira ConnBerry, Jason A, NP   2 mg at 08/09/20 2121  . ondansetron (ZOFRAN-ODT) disintegrating tablet 4 mg  4 mg Oral Q6H PRN Pedro Oldenburg, Gerlene Burdockravis B, FNP   4 mg at 08/09/20 0756  . PARoxetine (PAXIL) tablet 20 mg  20 mg Oral Daily Marq Rebello, Gerlene Burdockravis B, FNP   20 mg at 08/09/20 0955  . thiamine tablet 100 mg  100 mg Oral Daily Ijanae Macapagal, Gerlene Burdockravis B, FNP   100 mg at 08/09/20 0955  . traZODone (DESYREL) tablet 50 mg  50 mg Oral QHS PRN Jacub Waiters, Gerlene Burdockravis B, FNP       Current Outpatient Medications  Medication Sig Dispense Refill  . cloNIDine (CATAPRES) 0.1 MG tablet Take 1 tablet (0.1 mg total) by mouth at bedtime. 30 tablet 0  . gabapentin (NEURONTIN) 400 MG capsule Take 1 capsule (400 mg total) by mouth 3 (three) times daily. 90 capsule 0  . hydrOXYzine (ATARAX/VISTARIL) 25 MG tablet Take 1 tablet (25 mg total) by mouth every 6 (six) hours as needed for anxiety. 30 tablet 0  . nicotine polacrilex (NICORETTE) 2 MG gum Take 1 each (2 mg total) by mouth as needed for smoking cessation. 100 tablet 0  . PARoxetine (PAXIL) 20 MG tablet Take 1 tablet (20 mg total) by mouth daily. 30 tablet 0    PTA Medications: (Not in a hospital admission)   Musculoskeletal   Strength & Muscle Tone: within normal limits Gait & Station: normal Patient leans: N/A  Psychiatric Specialty Exam  Presentation  General Appearance: Appropriate for Environment;Casual  Eye Contact:Good  Speech:Clear and Coherent;Normal Rate  Speech Volume:Normal  Handedness:Right   Mood and Affect  Mood:Euthymic  Affect:Congruent;Appropriate   Thought Process  Thought Processes:Coherent  Descriptions of Associations:Intact  Orientation:Full (Time, Place and Person)  Thought Content:WDL  Hallucinations:Hallucinations: None  Ideas of Reference:None  Suicidal Thoughts:Suicidal Thoughts: No  Homicidal Thoughts:Homicidal Thoughts: No   Sensorium  Memory:Immediate Good;Recent Good;Remote Good  Judgment:Fair  Insight:Fair   Executive Functions  Concentration:Good  Attention Span:Good  Recall:Good  Fund of Knowledge:Good  Language:Good   Psychomotor Activity  Psychomotor Activity:Psychomotor Activity: Normal   Assets  Assets:Communication Skills;Desire for Improvement;Housing;Physical Health;Transportation   Sleep  Sleep:Sleep: Good   Physical Exam  Physical Exam Vitals and nursing note reviewed.  Constitutional:      Appearance: He is well-developed.  Cardiovascular:     Rate and Rhythm: Normal rate.  Pulmonary:     Effort: Pulmonary effort is normal.  Musculoskeletal:        General: Normal range of motion.  Skin:    General: Skin is warm.  Neurological:     Mental Status: He is alert and oriented to person, place, and time.    Review of Systems  Constitutional: Negative.   HENT: Negative.   Eyes: Negative.   Respiratory: Negative.   Cardiovascular: Negative.   Gastrointestinal: Negative.   Genitourinary: Negative.   Musculoskeletal: Negative.   Skin: Negative.   Neurological: Negative.   Endo/Heme/Allergies: Negative.   Psychiatric/Behavioral: Positive for substance abuse.   Blood pressure (!) 125/93, pulse 97,  temperature 97.7 F (36.5 C), resp. rate 18, height 6\' 1"  (1.854 m), weight  197 lb (89.4 kg), SpO2 98 %. Body mass index is 25.99 kg/m.  Demographic Factors:  Male  Loss Factors: NA  Historical Factors: NA  Risk Reduction Factors:   Living with another person, especially a relative, Positive social support, Positive therapeutic relationship and Positive coping skills or problem solving skills  Continued Clinical Symptoms:  Alcohol/Substance Abuse/Dependencies  Cognitive Features That Contribute To Risk:  None    Suicide Risk:  Minimal: No identifiable suicidal ideation.  Patients presenting with no risk factors but with morbid ruminations; may be classified as minimal risk based on the severity of the depressive symptoms  Plan Of Care/Follow-up recommendations:  Continue activity as tolerated. Continue diet as recommended by your PCP. Ensure to keep all appointments with outpatient providers.  Disposition: Discharge to [facility]  Gerlene Burdock Lilyona Richner, FNP 08/10/2020, 7:18 AM

## 2020-08-10 NOTE — ED Notes (Signed)
Pt sleeping at present, no distress noted, monitoring for safety. 

## 2020-08-10 NOTE — Discharge Instructions (Addendum)
Take medications as prescribed Use resources provided

## 2020-08-10 NOTE — ED Notes (Signed)
Pt A&O x 4, no distress noted, calm & cooperative, watching TV at present, monitoring for safety.  Pending [facility] in AM.

## 2020-08-10 NOTE — ED Notes (Signed)
Pt sleeping in no acute distress. Safety maintained. 

## 2020-08-10 NOTE — ED Notes (Signed)
Patient was discharged

## 2020-08-10 NOTE — ED Notes (Signed)
Pt discharged in no acute distress. Denies SI/HI upon discharge. Verbalized understanding of all discharge instructions. Safe transport service transported pt to [facility] in [place]. Safety maintained.

## 2020-08-11 ENCOUNTER — Emergency Department (HOSPITAL_BASED_OUTPATIENT_CLINIC_OR_DEPARTMENT_OTHER)
Admission: EM | Admit: 2020-08-11 | Discharge: 2020-08-11 | Disposition: A | Discharge status: Home / Self Care | Payer: Self-pay | Attending: Emergency Medicine | Admitting: Emergency Medicine

## 2020-08-11 ENCOUNTER — Encounter (HOSPITAL_COMMUNITY): Payer: Self-pay | Admitting: Emergency Medicine

## 2020-08-11 ENCOUNTER — Ambulatory Visit (HOSPITAL_COMMUNITY)
Admission: EM | Admit: 2020-08-11 | Discharge: 2020-08-12 | Disposition: A | Discharge status: Home / Self Care | Payer: No Payment, Other | Attending: Behavioral Health | Admitting: Behavioral Health

## 2020-08-11 ENCOUNTER — Other Ambulatory Visit: Payer: Self-pay

## 2020-08-11 ENCOUNTER — Encounter (HOSPITAL_BASED_OUTPATIENT_CLINIC_OR_DEPARTMENT_OTHER): Payer: Self-pay | Admitting: Emergency Medicine

## 2020-08-11 DIAGNOSIS — Z91010 Allergy to peanuts: Secondary | ICD-10-CM | POA: Insufficient documentation

## 2020-08-11 DIAGNOSIS — R45851 Suicidal ideations: Secondary | ICD-10-CM | POA: Insufficient documentation

## 2020-08-11 DIAGNOSIS — F17210 Nicotine dependence, cigarettes, uncomplicated: Secondary | ICD-10-CM | POA: Insufficient documentation

## 2020-08-11 DIAGNOSIS — F1094 Alcohol use, unspecified with alcohol-induced mood disorder: Secondary | ICD-10-CM | POA: Insufficient documentation

## 2020-08-11 DIAGNOSIS — F329 Major depressive disorder, single episode, unspecified: Secondary | ICD-10-CM | POA: Insufficient documentation

## 2020-08-11 DIAGNOSIS — Z20822 Contact with and (suspected) exposure to covid-19: Secondary | ICD-10-CM | POA: Insufficient documentation

## 2020-08-11 DIAGNOSIS — Z79899 Other long term (current) drug therapy: Secondary | ICD-10-CM | POA: Insufficient documentation

## 2020-08-11 DIAGNOSIS — F1010 Alcohol abuse, uncomplicated: Secondary | ICD-10-CM

## 2020-08-11 DIAGNOSIS — F419 Anxiety disorder, unspecified: Secondary | ICD-10-CM | POA: Insufficient documentation

## 2020-08-11 LAB — COMPREHENSIVE METABOLIC PANEL
ALT: 15 U/L (ref 0–44)
AST: 18 U/L (ref 15–41)
Albumin: 4 g/dL (ref 3.5–5.0)
Alkaline Phosphatase: 78 U/L (ref 38–126)
Anion gap: 10 (ref 5–15)
BUN: 7 mg/dL (ref 6–20)
CO2: 24 mmol/L (ref 22–32)
Calcium: 8.8 mg/dL — ABNORMAL LOW (ref 8.9–10.3)
Chloride: 104 mmol/L (ref 98–111)
Creatinine, Ser: 0.92 mg/dL (ref 0.61–1.24)
GFR calc Af Amer: >60 mL/min (ref >60)
GFR calc non Af Amer: >60 mL/min (ref >60)
Glucose, Bld: 114 mg/dL — ABNORMAL HIGH (ref 70–99)
Potassium: 3.6 mmol/L (ref 3.5–5.1)
Sodium: 138 mmol/L (ref 135–145)
Total Bilirubin: 0.9 mg/dL (ref 0.3–1.2)
Total Protein: 6.9 g/dL (ref 6.5–8.1)

## 2020-08-11 LAB — CBC
HCT: 42.6 % (ref 39.0–52.0)
Hemoglobin: 14.1 g/dL (ref 13.0–17.0)
MCH: 30.9 pg (ref 26.0–34.0)
MCHC: 33.1 g/dL (ref 30.0–36.0)
MCV: 93.2 fL (ref 80.0–100.0)
Platelets: 260 10*3/uL (ref 150–400)
RBC: 4.57 MIL/uL (ref 4.22–5.81)
RDW: 14 % (ref 11.5–15.5)
WBC: 8.6 10*3/uL (ref 4.0–10.5)
nRBC: 0 % (ref 0.0–0.2)

## 2020-08-11 LAB — RAPID URINE DRUG SCREEN, HOSP PERFORMED
Amphetamines: NOT DETECTED
Barbiturates: NOT DETECTED
Benzodiazepines: POSITIVE — AB
Cocaine: NOT DETECTED
Opiates: NOT DETECTED
Tetrahydrocannabinol: NOT DETECTED

## 2020-08-11 LAB — ACETAMINOPHEN LEVEL: Acetaminophen (Tylenol), Serum: <10 ug/mL — ABNORMAL LOW (ref 10–30)

## 2020-08-11 LAB — SARS CORONAVIRUS 2 BY RT PCR (HOSPITAL ORDER, PERFORMED IN ~~LOC~~ HOSPITAL LAB): SARS Coronavirus 2: NEGATIVE

## 2020-08-11 LAB — SALICYLATE LEVEL: Salicylate Lvl: <7 mg/dL — ABNORMAL LOW (ref 7.0–30.0)

## 2020-08-11 LAB — ETHANOL: Alcohol, Ethyl (B): <10 mg/dL (ref <10)

## 2020-08-11 MED ORDER — THIAMINE HCL 100 MG PO TABS
100.0000 mg | ORAL_TABLET | Freq: Every day | ORAL | Status: DC
  Administered 2020-08-11: 100 mg via ORAL
  Filled 2020-08-11: qty 1

## 2020-08-11 MED ORDER — LORAZEPAM 1 MG PO TABS: 0.0000 mg | ORAL_TABLET | Freq: Two times a day (BID) | ORAL | Status: DC

## 2020-08-11 MED ORDER — LORAZEPAM 2 MG/ML IJ SOLN: 0.0000 mg | Freq: Four times a day (QID) | INTRAMUSCULAR | Status: DC

## 2020-08-11 MED ORDER — NICOTINE 21 MG/24HR TD PT24
21.0000 mg | MEDICATED_PATCH | Freq: Every day | TRANSDERMAL | Status: DC
  Administered 2020-08-11: 21 mg via TRANSDERMAL
  Filled 2020-08-11: qty 1

## 2020-08-11 MED ORDER — ALUM & MAG HYDROXIDE-SIMETH 200-200-20 MG/5ML PO SUSP: 30.0000 mL | Freq: Four times a day (QID) | ORAL | Status: DC | PRN

## 2020-08-11 MED ORDER — ALUM & MAG HYDROXIDE-SIMETH 200-200-20 MG/5ML PO SUSP: 30.0000 mL | ORAL | Status: DC | PRN

## 2020-08-11 MED ORDER — ACETAMINOPHEN 325 MG PO TABS: 650.0000 mg | ORAL_TABLET | Freq: Four times a day (QID) | ORAL | Status: DC | PRN

## 2020-08-11 MED ORDER — LORAZEPAM 2 MG/ML IJ SOLN: 0.0000 mg | Freq: Two times a day (BID) | INTRAMUSCULAR | Status: DC

## 2020-08-11 MED ORDER — LORAZEPAM 1 MG PO TABS
2.0000 mg | ORAL_TABLET | Freq: Once | ORAL | Status: AC
  Administered 2020-08-11: 2 mg via ORAL
  Filled 2020-08-11: qty 2

## 2020-08-11 MED ORDER — NICOTINE POLACRILEX 2 MG MT GUM
2.0000 mg | CHEWING_GUM | OROMUCOSAL | Status: DC | PRN
  Administered 2020-08-11 – 2020-08-12 (×3): 2 mg via ORAL
  Filled 2020-08-11 (×3): qty 1

## 2020-08-11 MED ORDER — CLONIDINE HCL 0.1 MG PO TABS
0.1000 mg | ORAL_TABLET | Freq: Every day | ORAL | Status: DC
  Administered 2020-08-11: 0.1 mg via ORAL
  Filled 2020-08-11 (×2): qty 1

## 2020-08-11 MED ORDER — PAROXETINE HCL 20 MG PO TABS: 20.0000 mg | ORAL_TABLET | Freq: Once | ORAL | Status: DC

## 2020-08-11 MED ORDER — MAGNESIUM HYDROXIDE 400 MG/5ML PO SUSP: 30.0000 mL | Freq: Every day | ORAL | Status: DC | PRN

## 2020-08-11 MED ORDER — HYDROXYZINE HCL 25 MG PO TABS
25.0000 mg | ORAL_TABLET | Freq: Four times a day (QID) | ORAL | Status: DC | PRN
  Administered 2020-08-11: 25 mg via ORAL
  Filled 2020-08-11: qty 1

## 2020-08-11 MED ORDER — GABAPENTIN 400 MG PO CAPS
400.0000 mg | ORAL_CAPSULE | Freq: Three times a day (TID) | ORAL | Status: DC
  Administered 2020-08-11 – 2020-08-12 (×2): 400 mg via ORAL
  Filled 2020-08-11 (×2): qty 1

## 2020-08-11 MED ORDER — LORAZEPAM 1 MG PO TABS: 0.0000 mg | ORAL_TABLET | Freq: Four times a day (QID) | ORAL | Status: DC

## 2020-08-11 MED ORDER — THIAMINE HCL 100 MG/ML IJ SOLN: 100.0000 mg | Freq: Every day | INTRAMUSCULAR | Status: DC

## 2020-08-11 MED ORDER — IBUPROFEN 400 MG PO TABS
600.0000 mg | ORAL_TABLET | Freq: Three times a day (TID) | ORAL | Status: DC | PRN
  Administered 2020-08-11: 600 mg via ORAL
  Filled 2020-08-11: qty 1

## 2020-08-11 MED ORDER — ONDANSETRON HCL 4 MG PO TABS
4.0000 mg | ORAL_TABLET | Freq: Three times a day (TID) | ORAL | Status: DC | PRN
  Filled 2020-08-11: qty 1

## 2020-08-11 MED ORDER — CHLORDIAZEPOXIDE HCL 25 MG PO CAPS
100.0000 mg | ORAL_CAPSULE | Freq: Once | ORAL | Status: AC
  Administered 2020-08-11: 100 mg via ORAL
  Filled 2020-08-11: qty 4

## 2020-08-11 NOTE — ED Notes (Signed)
[facility] called and pt placed on list for transport to [facility]

## 2020-08-11 NOTE — ED Notes (Signed)
[facility] member here, provided driver with paperwork and bag of pt belongings. Upon leaving , pt very alert, oriented, calm, cooperative and demonstrated appropriate behavior. CIWA score at 0 upon discharge via [facility]. Pt voided and denied any complaints of pain or discomfort

## 2020-08-11 NOTE — ED Notes (Signed)
Patient has a bag of belongings in locker 1

## 2020-08-11 NOTE — ED Notes (Signed)
Awake, oriented x 4, TTS currently in progress, calm, cooperative, ambulated to restroom, gait noted to be very stable, CIWA currently at 0

## 2020-08-11 NOTE — BH Assessment (Addendum)
Comprehensive Clinical Assessment (CCA) Note  08/11/2020 Carlos Flowers [id number]    Patient was just released from the [facility] yesterday.  He had presented with suicidal ideation and the need for alcohol detox.  Patient has been drinking daily, on average 12 beers and he last drank two days ago and states that he was in withdrawal.  Patient was kept in the [facility] and was started on the Librium Detox Protocol, but was not showing any signs and symptoms of withdrawal in over 36 hours of continuous observation.  Therefore, arrangements were made for him to be admitted to the [facility] on 08/10/2020.  However, upon his arrival there, he told them that he was in alcohol withdrawal and that he was still having suicidal thoughts and they would not admit him.  He states that he did not know anything to do other than to call EMS and go to the ED.  Patient states that he has continued to have suicidal thoughts despite his saying that he was no longer suicidal when he was discharged from the [facility] yesterday.  TTS inquired to what happened to make him suicidal again and he stated, "I guess the thoughts just come and go."  Patient has no current plan for suicide and he denies HI/Psychosis.  Patient states that he feels like he needs inpatient treatment and detox.  He was also recently (within the past month) at the [facility] in [place].  Note from 08/10/2020 by Marvia Pickles, NP: Subjective: Patient presents this morning reporting that he is feeling better.  Patient denies any suicidal homicidal ideations and denies any hallucinations.  Patient states that he is ready to go to residential treatment for substance abuse.  Patient is requesting to have nicotine patches provided to him to assist with his smoking cessation since he has been told that he is not allowed to smoke there.  Patient reports that he slept well last night and has no complaints this morning.  Stay Summary: Patient is a 39 year old  male that presented to the [facility] as a walk-in with complaints of unwellness, increased anxiety, and alcohol abuse.  Patient was admitted to the continuous observation unit and restarted on his home medications of Paxil, Neurontin, and clonidine.  Patient was also placed on Ativan as needed detox protocol.  Patient was requesting to go to [facility] residential for substance abuse treatment.  Patient remained on the continuous observation unit for 2 days and [facility].  Patient was provided with 14-day samples of his medications as well as prescriptions for his medications.  Patient has continued to deny any suicidal homicidal ideations and denies any hallucinations.  [facility].  Note from 08/08/2020 by Orvis Brill, LCSW: Patient is a 39 year old male presenting voluntarily to [facility] for assessment. Patient endorses SI, alcohol withdrawal, and increased anxiety. Patient reports he has been homeless and therefore has moved around a lot. Prior to being homeless he was in a sober living facility but left because he met a woman. Patient reports he has been off his medications (Paxil, Gabapentin, and Clonidine) for about 1 month because he cannot afford them. He currently endorses passive SI. He denies any prior attempts or self-harming behavior. Patient denies HI/AVH. He endorses daily alcohol use. He states he drinks 1 case of beer daily and had 6-8 beers earlier this date. Patient denies any other substance use.  Visit  Diagnosis:  F10.94 Alcohol Induced Mood Disorder   CCA Screening, Triage and Referral (STR)  Patient Reported Information How did you hear about [place]? Self  Referral name: No data recorded Referral phone number: No data recorded  Whom do you see for routine medical problems? I don't have a doctor  Practice/Facility Name: No data recorded Practice/Facility Phone Number: No data  recorded Name of Contact: No data recorded Contact Number: No data recorded Contact Fax Number: No data recorded Prescriber Name: No data recorded Prescriber Address (if known): No data recorded  What Is the Reason for Your Visit/Call Today? Patient sttes that he is suicidal and he is in alcohol withdrawal, requesting detox.  How Long Has This Been Causing You Problems? > than 6 months  What Do You Feel Would Help You the Most Today? Other (Comment) (Patient is requesting inpatient treatment)   Have You Recently Been in Any Inpatient Treatment (Hospital/Detox/Crisis Center/28-Day Program)? No  Name/Location of Program/Hospital:No data recorded How Long Were You There? No data recorded When Were You Discharged? No data recorded  Have You Ever Received Services From [facility] Before? Yes  Who Do You See at [facility]? has been in the ED and the [facility] recently   Have You Recently Had Any Thoughts About Hurting Yourself? Yes  Are You Planning to Commit Suicide/Harm Yourself At This time? No   Have you Recently Had Thoughts About [place]? No  Explanation: No data recorded  Have You Used Any Alcohol or Drugs in the Past 24 Hours? No (states that he he last drank 2 days ago)  How Long Ago Did You Use Drugs or Alcohol? No data recorded What Did You Use and How Much? normally drinks 12 beers per day   Do You Currently Have a Therapist/Psychiatrist? No  Name of Therapist/Psychiatrist: No data recorded  Have You Been Recently Discharged From Any Office Practice or Programs? No  Explanation of Discharge From Practice/Program: No data recorded    CCA Screening Triage Referral Assessment Type of Contact: Tele-Assessment  Is this Initial or Reassessment? Initial Assessment  Date Telepsych consult ordered in [facility]:  08/11/20  Time Telepsych consult ordered in [facility]:  0656   Patient Reported Information Reviewed? Yes  Patient Left Without Being Seen? No data  recorded Reason for Not Completing Assessment: No data recorded  Collateral Involvement: no collateral information available.  Patient is homeless and has minimal support   Does Patient Have a Stage manager Guardian? No data recorded Name and Contact of Legal Guardian: No data recorded If Minor and Not Living with Parent(s), Who has Custody? No data recorded Is CPS involved or ever been involved? Never  Is APS involved or ever been involved? Never   Patient Determined To Be At Risk for Harm To Self or Others Based on Review of Patient Reported Information or Presenting Complaint? Yes, for Self-Harm  Method: No data recorded Availability of Means: No data recorded Intent: No data recorded Notification Required: No data recorded Additional Information for Danger to Others Potential: No data recorded Additional Comments for Danger to Others Potential: No data recorded Are There Guns or Other Weapons in Your Home? No data recorded Types of Guns/Weapons: No data recorded Are These Weapons Safely Secured?                            No data recorded Who Could Verify You Are Able To Have These Secured: No  data recorded Do You Have any Outstanding Charges, Pending Court Dates, Parole/Probation? No data recorded Contacted To Inform of Risk of Harm To Self or Others: Other: Comment (no one available to notify)   Location of Assessment: [place]   Does Patient Present under Involuntary Commitment? No  IVC Papers Initial File Date: No data recorded  [place] of Residence: [address]   Patient Currently Receiving the Following Services: Not Receiving Services   Determination of Need: Emergent (2 hours)   Options For Referral: Inpatient Hospitalization;Other: Comment (Refer to [facility])     CCA Biopsychosocial  Intake/Chief Complaint:  CCA Intake With Chief Complaint CCA Part Two Date: 08/11/20 CCA Part Two Time: 1275 Chief Complaint/Presenting Problem:  Patient was just released from the [facility] yesterday.  He had presented with suicidal ideation and the need for alcohol detox.  Patient has been drinking daily, on average 12 beers and he last drank two days ago and states that he was in withdrawal.  Patient was kept in the [facility] and was started on the Librium Detox Protocol, but was not showing any signs and symptoms of withdrawal in over 36 hours of continuous observation.  Therefore, arrangements were made for him to be admitted to the [facility] on 08/10/2020.  However, upon his arrival there, he told them that he was in alcohol withdrawal and that he was still having suicidal thoughts and they would not admit him.  He states that he did not know anything to do other than to call EMS and go to the ED.  Patient states that he has continued to have suicidal thoughts despite his saying that he was no longer suicidal when he was discharged from the [facility] yesterday.  TTS inquired to what happened to make him suicidal again and he stated, "I guess the thoughts just come and go."  Patient has no current plan for suicide and he denies HI/Psychosis.  Patient states that he feels like he needs inpatient treatment and detox.  He was also recently (within the past month) at the [facility] in [place]. Patient's Currently Reported Symptoms/Problems: Patient states that he is depressed and he states that his heart rate and blood pressure are elevated Individual's Strengths: Patient was not able to identify any individal strengths that he possesses Individual's Preferences: Patient has no special preferences or requests that require special accommodation Individual's Abilities: Patient was not able to identify any special abilities or talents Type of Services Patient Feels Are Needed: Patient is requesting inpatient treatment Initial Clinical Notes/Concerns: NA  Mental Health Symptoms Depression:  Depression: Change in energy/activity, Difficulty  Concentrating, Fatigue, Hopelessness, Increase/decrease in appetite, Irritability, Sleep (too much or little), Tearfulness, Weight gain/loss, Worthlessness, Duration of symptoms greater than two weeks  Mania:  Mania: None  Anxiety:   Anxiety: Difficulty concentrating, Irritability, Restlessness, Worrying, Tension  Psychosis:  Psychosis: None  Trauma:  Trauma: None  Obsessions:  Obsessions: None  Compulsions:  Compulsions: "Driven" to perform behaviors/acts (Patient has compulsions to drink alcohol)  Inattention:  Inattention: None  Hyperactivity/Impulsivity:  Hyperactivity/Impulsivity: N/A  Oppositional/Defiant Behaviors:  Oppositional/Defiant Behaviors: None  Emotional Irregularity:  Emotional Irregularity: None  Other Mood/Personality Symptoms:      Mental Status Exam Appearance and self-care  Stature:  Stature: Average  Weight:  Weight: Average weight  Clothing:  Clothing: Disheveled  Grooming:  Grooming: Neglected  Cosmetic use:  Cosmetic Use: None  Posture/gait:  Posture/Gait: Slumped  Motor activity:  Motor Activity: Not Remarkable  Sensorium  Attention:  Attention: Normal  Concentration:  Concentration: Normal  Orientation:  Orientation: Object, Person, Place, Situation, Time  Recall/memory:  Recall/Memory: Normal  Affect and Mood  Affect:  Affect: Anxious  Mood:  Mood: Anxious  Relating  Eye contact:  Eye Contact: Normal  Facial expression:  Facial Expression: Anxious  Attitude toward examiner:  Attitude Toward Examiner: Cooperative  Thought and Language  Speech flow: Speech Flow: Clear and Coherent  Thought content:  Thought Content: Appropriate to Mood and Circumstances  Preoccupation:  Preoccupations: None  Hallucinations:  Hallucinations: None  Organization:     Transport planner of Knowledge:  Fund of Knowledge: Fair  Intelligence:  Intelligence: Average  Abstraction:  Abstraction: Normal  Judgement:  Judgement: Impaired  Reality Testing:  Reality  Testing: Distorted  Insight:  Insight: Gaps  Decision Making:  Decision Making: Impulsive  Social Functioning  Social Maturity:  Social Maturity: Irresponsible  Social Judgement:  Social Judgement: Heedless  Stress  Stressors:  Stressors: Housing, Psychologist, clinical Ability:  Coping Ability: Research officer, political party Deficits:  Skill Deficits: None  Supports:  Supports: Support needed     Religion: Religion/Spirituality Are You A Religious Person?: No  Leisure/Recreation: Leisure / Recreation Do You Have Hobbies?: No  Exercise/Diet: Exercise/Diet Do You Exercise?: No Have You Gained or Lost A Significant Amount of Weight in the Past Six Months?: No Do You Follow a Special Diet?: No Do You Have Any Trouble Sleeping?: Yes Explanation of Sleeping Difficulties: reports little to no sleep   CCA Employment/Education  Employment/Work Situation: Employment / Work Situation Employment situation: Unemployed Patient's job has been impacted by current illness: No 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 Where was the patient employed at that time?: [employer] Has patient ever been in the [employer]?: No  Education: Education Is Patient Currently Attending School?: No Last Grade Completed: 12 Did Teacher, adult education From [employer]?: Yes Did Physicist, medical?: No Did Heritage manager?: No Did You Have An Individualized Education Program (IIEP): No Did You Have Any Difficulty At [employer]?: No Patient's Education Has Been Impacted by Current Illness: No   CCA Family/Childhood History  Family and Relationship History: Family history Marital status: Single Are you sexually active?: Yes What is your sexual orientation?: heterosexual Has your sexual activity been affected by drugs, alcohol, medication, or emotional stress?: no Does patient have children?: No  Childhood History:  Childhood History By whom was/is the patient raised?: Mother Additional childhood history  information: none provided Description of patient's relationship with caregiver when they were a child: "fine"; father alcohol Patient's description of current relationship with people who raised him/her: strained How were you disciplined when you got in trouble as a child/adolescent?: NA Does patient have siblings?: No Did patient suffer any verbal/emotional/physical/sexual abuse as a child?: No Did patient suffer from severe childhood neglect?: No Has patient ever been sexually abused/assaulted/raped as an adolescent or adult?: No Was the patient ever a victim of a crime or a disaster?: No Witnessed domestic violence?: No Has patient been affected by domestic violence as an adult?: No  Child/Adolescent Assessment:     CCA Substance Use  Alcohol/Drug Use: Alcohol / Drug Use Pain Medications: see MAR Prescriptions: see MAR Over the Counter: see MAR History of alcohol / drug use?: Yes Negative Consequences of Use: Personal relationships, Work / Youth worker, Museum/gallery curator Withdrawal Symptoms: Change in blood pressure, DTs Substance #1 Name of Substance 1: Alcohol 1 - Age of First  Use: UTA 1 - Amount (size/oz): 1 case 1 - Frequency: daily 1 - Duration: 1 year 1 - Last Use / Amount: this date 6-8 beers                       ASAM's:  Six Dimensions of Multidimensional Assessment  Dimension 1:  Acute Intoxication and/or Withdrawal Potential:   Dimension 1:  Description of individual's past and current experiences of substance use and withdrawal: Patient states that he has a history of DTs when he is withdrawing from alcohol  Dimension 2:  Biomedical Conditions and Complications:   Dimension 2:  Description of patient's biomedical conditions and  complications: when patient is withdrawing from alcohol, he states that he experiences elevated blood pressure and heart rate  Dimension 3:  Emotional, Behavioral, or Cognitive Conditions and Complications:  Dimension 3:  Description of  emotional, behavioral, or cognitive conditions and complications: Patient states that he is depressed and he drinks to self-medicate his depression  Dimension 4:  Readiness to Change:  Dimension 4:  Description of Readiness to Change criteria: Patient states that he is ready to take the steps necessary to stop drinking and states that he plans to pursue treatment  Dimension 5:  Relapse, Continued use, or Continued Problem Potential:  Dimension 5:  Relapse, continued use, or continued problem potential critiera description: Patient has minimal success in recovery and is a chronic relapser  Dimension 6:  Recovery/Living Environment:  Dimension 6:  Recovery/Iiving environment criteria description: Patient is homeless and has minimal support  ASAM Severity Score: ASAM's Severity Rating Score: 11  ASAM Recommended Level of Treatment: ASAM Recommended Level of Treatment: Level III Residential Treatment   Substance use Disorder (SUD) Substance Use Disorder (SUD)  Checklist Symptoms of Substance Use: Continued use despite having a persistent/recurrent physical/psychological problem caused/exacerbated by use, Continued use despite persistent or recurrent social, interpersonal problems, caused or exacerbated by use, Evidence of tolerance, Evidence of withdrawal (Comment), Persistent desire or unsuccessful efforts to cut down or control use, Presence of craving or strong urge to use, Recurrent use that results in a failure to fulfill major role obligations (work, school, home), Social, occupational, recreational activities given up or reduced due to use, Substance(s) often taken in larger amounts or over longer times than was intended  Recommendations for Services/Supports/Treatments: Recommendations for Services/Supports/Treatments Recommendations For Services/Supports/Treatments: Residential-Level 3  DSM5 Diagnoses: Patient Active Problem List   Diagnosis Date Noted  . Alcohol-induced mood disorder ([place])      Disposition:  Per Marvia Pickles, NP, patient can be moved to the [facility] for continuous observation    Referrals to Alternative Service(s): Referred to Alternative Service(s):   Place:   Date:   Time:    Referred to Alternative Service(s):   Place:   Date:   Time:    Referred to Alternative Service(s):   Place:   Date:   Time:    Referred to Alternative Service(s):   Place:   Date:   Time:     Judeth Porch Zalmen Wrightsman

## 2020-08-11 NOTE — ED Notes (Signed)
Meal and PO fluids provided to client

## 2020-08-11 NOTE — ED Notes (Signed)
Patient is sound asleep.  TSS tried to call and informed her, Sharyl Nimrod (from TSS), that patient just received Ativan.   She will try to call again if patient is awake.

## 2020-08-11 NOTE — ED Notes (Signed)
Pt ate 100% of meal, (2 bags of chips, 2 frozen meals from our dept)

## 2020-08-11 NOTE — ED Notes (Signed)
Cont to be resting quietly in room, safety measures in place, enviornment remains secured

## 2020-08-11 NOTE — ED Triage Notes (Signed)
Pt presents to [facility] with suicidal ideation w/o plan

## 2020-08-11 NOTE — BHH Counselor (Signed)
TTS attempted to complete assessment. Patient unable to wake up to participate despite multiple prompts from this assessor and RN. Per RN, patient received Ativan about 30 minutes ago. This assessor will call back to check on patient a little later. RN states if patient wakes up before then she will notify TTS.

## 2020-08-11 NOTE — ED Triage Notes (Signed)
Pt reports SI , no plan, recent admission , alcohol abuses. Last alcoholic drinjk 2 days . Pt states homelessness.

## 2020-08-11 NOTE — ED Provider Notes (Signed)
[facility] EMERGENCY DEPARTMENT Provider Note   CSN: [id number] Arrival date & time: 08/11/20  0913     History Chief Complaint  Patient presents with   Suicidal   Alcohol Problem    Carlos Flowers is a 39 y.o. male.  39 yo M with a chief complaint of suicidal ideation.  Patient has tried to commit suicide in the past usually by overdose.  He has no plan currently to commit suicide.  States that it has been worsening over the past couple days.  He also recently stopped drinking and typically is an everyday drinker.  Feels like he is going into withdrawal he has felt increased anxiety and depression.  He has had some nausea but denies other medical symptom.  Denies cough congestion or fever denies chest pain or pressure denies headache or neck pain.  Is not on any mental health medications at home.  The history is provided by the patient.  Illness Severity:  Moderate Onset quality:  Gradual Duration:  2 days Timing:  Constant Progression:  Worsening Chronicity:  New Associated symptoms: no abdominal pain, no chest pain, no congestion, no diarrhea, no fever, no headaches, no myalgias, no rash, no shortness of breath and no vomiting        Past Medical History:  Diagnosis Date   Anxiety    Depression    Substance abuse [facility])     Patient Active Problem List   Diagnosis Date Noted   Alcohol-induced mood disorder (HCC)     History reviewed. No pertinent surgical history.     Family History  Problem Relation Age of Onset   Cancer Maternal Grandmother    Heart disease Paternal Grandmother    Heart disease Paternal Grandfather     Social History   Tobacco Use   Smoking status: Current Every Day Smoker    Packs/day: 1.00    Years: 17.00    Pack years: 17.00    Types: Cigarettes   Smokeless tobacco: Never Used  Vaping Use   Vaping Use: Never used  Substance Use Topics   Alcohol use: Yes    Alcohol/week: 10.0 - 12.0 standard drinks      Types: 10 - 12 Cans of beer per week    Comment: 2 days ago   Drug use: Not Currently    Types: Cocaine, Heroin    Comment: last use 12/17/2018    Home Medications Prior to Admission medications   Medication Sig Start Date End Date Taking? Authorizing Provider  cloNIDine (CATAPRES) 0.1 MG tablet Take 1 tablet (0.1 mg total) by mouth at bedtime. 08/09/20   Aldean Baker, NP  gabapentin (NEURONTIN) 400 MG capsule Take 1 capsule (400 mg total) by mouth 3 (three) times daily. 08/09/20   Aldean Baker, NP  hydrOXYzine (ATARAX/VISTARIL) 25 MG tablet Take 1 tablet (25 mg total) by mouth every 6 (six) hours as needed for anxiety. 08/09/20   Aldean Baker, NP  nicotine polacrilex (NICORETTE) 2 MG gum Take 1 each (2 mg total) by mouth as needed for smoking cessation. 08/09/20   Aldean Baker, NP  PARoxetine (PAXIL) 20 MG tablet Take 1 tablet (20 mg total) by mouth daily. 08/10/20   Aldean Baker, NP    Allergies    Peanut-containing drug products  Review of Systems   Review of Systems  Constitutional: Negative for chills and fever.  HENT: Negative for congestion and facial swelling.   Eyes: Negative for discharge and visual disturbance.  Respiratory: Negative for shortness of breath.   Cardiovascular: Negative for chest pain and palpitations.  Gastrointestinal: Negative for abdominal pain, diarrhea and vomiting.  Musculoskeletal: Negative for arthralgias and myalgias.  Skin: Negative for color change and rash.  Neurological: Negative for tremors, syncope and headaches.  Psychiatric/Behavioral: Positive for dysphoric mood and suicidal ideas. Negative for confusion. The patient is nervous/anxious.     Physical Exam Updated Vital Signs BP (!) 136/99    Pulse (!) 104    Temp 98.5 F (36.9 C)    Resp 18    SpO2 99%   Physical Exam Vitals and nursing note reviewed.  Constitutional:      Appearance: He is well-developed.  HENT:     Head: Normocephalic and atraumatic.  Eyes:     Pupils:  Pupils are equal, round, and reactive to light.  Neck:     Vascular: No JVD.  Cardiovascular:     Rate and Rhythm: Normal rate and regular rhythm.     Heart sounds: No murmur heard.  No friction rub. No gallop.   Pulmonary:     Effort: No respiratory distress.     Breath sounds: No wheezing.  Abdominal:     General: There is no distension.     Tenderness: There is no abdominal tenderness. There is no guarding or rebound.  Musculoskeletal:        General: Normal range of motion.     Cervical back: Normal range of motion and neck supple.  Skin:    Coloration: Skin is not pale.     Findings: No rash.  Neurological:     Mental Status: He is alert and oriented to person, place, and time.  Psychiatric:        Behavior: Behavior normal.        Thought Content: Thought content includes suicidal ideation. Thought content does not include homicidal ideation. Thought content does not include homicidal or suicidal plan.     ED Results / Procedures / Treatments   Labs (all labs ordered are listed, but only abnormal results are displayed) Labs Reviewed  COMPREHENSIVE METABOLIC PANEL - Abnormal; Notable for the following components:      Result Value   Glucose, Bld 114 (*)    Calcium 8.8 (*)    All other components within normal limits  SALICYLATE LEVEL - Abnormal; Notable for the following components:   Salicylate Lvl <7.0 (*)    All other components within normal limits  ACETAMINOPHEN LEVEL - Abnormal; Notable for the following components:   Acetaminophen (Tylenol), Serum <10 (*)    All other components within normal limits  RAPID URINE DRUG SCREEN, HOSP PERFORMED - Abnormal; Notable for the following components:   Benzodiazepines POSITIVE (*)    All other components within normal limits  SARS CORONAVIRUS 2 BY RT PCR (HOSPITAL ORDER, PERFORMED IN [facility])  ETHANOL  CBC    EKG None  Radiology No results found.  Procedures Procedures (including critical care  time)  Medications Ordered in ED Medications  chlordiazePOXIDE (LIBRIUM) capsule 100 mg (100 mg Oral Given 08/11/20 1022)  LORazepam (ATIVAN) tablet 2 mg (2 mg Oral Given 08/11/20 1022)    ED Course  I have reviewed the triage vital signs and the nursing notes.  Pertinent labs & imaging results that were available during my care of the patient were reviewed by me and considered in my medical decision making (see chart for details).    MDM Rules/Calculators/A&P  39 yo M with a chief complaints of suicidal ideation going on for the past 48 hours.  No overt plan.  He denies any medical complaints other than nausea.  Tells me that he is withdrawing from alcohol but clinically does not appear to be in alcohol withdrawal.  Was tachycardic in triage note resolved by the time of my exam.  Will give a dose of Librium and Ativan here.  Covid test. I feel he is medically clear.  TTS evaluation.  TTS to have [place] send to [facility].  :  I have discussed the diagnosis/risks/treatment options with the patient and believe the pt to be eligible for discharge home to follow-up with [facility]. We also discussed returning to the ED immediately if new or worsening sx occur. We discussed the sx which are most concerning (e.g., sudden worsening pain, fever, inability to tolerate by mouth) that necessitate immediate return. Medications administered to the patient during their visit and any new prescriptions provided to the patient are listed below.  Medications given during this visit Medications  chlordiazePOXIDE (LIBRIUM) capsule 100 mg (100 mg Oral Given 08/11/20 1022)  LORazepam (ATIVAN) tablet 2 mg (2 mg Oral Given 08/11/20 1022)     The patient appears reasonably screen and/or stabilized for discharge and I doubt any other medical condition or other [facility] requiring further screening, evaluation, or treatment in the ED at this time prior to discharge.   Final Clinical Impression(s) / ED  Diagnoses Final diagnoses:  Suicidal ideation  Alcohol abuse    Rx / DC Orders ED Discharge Orders    None       Melene Plan, DO 08/12/20 0703

## 2020-08-11 NOTE — ED Notes (Signed)
Patient requested a Nicotine patch.  He stated that he smoked a pack a day and 10-12 beer a day.  Last time he drank is 2 days ago.

## 2020-08-11 NOTE — ED Notes (Signed)
Pt presents with SI without a plan. Has average 12 beers daily. Pt was admitted to [facility] with c/o of alcohol withdrawal but  Denies HI/Psychosis. Pt resting at this time. Breathing even and unlabored. Will continue to monitor pt.

## 2020-08-12 NOTE — ED Notes (Addendum)
Pt sleeping@this time. Breathing even and unlabored. Will continue to monitor pt for safety 

## 2020-08-12 NOTE — Progress Notes (Signed)
Willis mad several phone caals over the morning until he found a friend who agreed to come and take him to [facility]. He promised the guy gas money. He was presented with his AVS, his questions were answered and he was escorted to lthe lobby per his request to wait for his friend. He received his personal belongings.

## 2020-08-12 NOTE — ED Notes (Signed)
Breakfast offered but pt said they were ok for the moment

## 2020-08-12 NOTE — ED Provider Notes (Signed)
FBC/OBS ASAP Discharge Summary  Date and Time: 08/12/2020 9:58 AM  Name: Carlos Flowers  MRN:  [id number]   Discharge Diagnoses:  Final diagnoses:  Alcohol-induced mood disorder (HCC)    Subjective: Patient states "I would like to return to my girlfriend's home in [place] but will need transportation."  Patient reports he came to the hospital related to suicidal thoughts x1 month, denies plan or intent. Patient reports primary stressor is alcohol use and alcohol withdrawal.  Patient denies suicidal or homicidal ideations. Patient denies history of suicide attempts. Patient denies self-harm behaviors. There is no evidence of delusional thought content and patient does not appear to be responding to internal stimuli. Patient denies symptoms of paranoia. Patient reports average sleep and appetite.  Patient resides in [place] with his girlfriend, charm. Patient denies access to weapons. Patient reports he is currently unemployed. Patient reports he is treated for depression and anxiety at [facility] in [place]. Patient denies substance use. Patient endorses alcohol use daily, approximately 12 beers per day. Patient reports readiness to stop alcohol use, last use approximately 1 week ago.  Patient offered support and encouragement.  Patient assessed by nurse practitioner. Patient alert and oriented, answers appropriately. Patient pleasant cooperative during assessment.  Patient gives verbal consent to speak with his girlfriend, charm phone number [phone]. Patient's girlfriend denies concerns regarding patient safety. Patient's girlfriend reports patient does not harm himself and has never endorsed thoughts of self-harm. Patient's girlfriend denies weapons in home. Patient's girlfriend reports that she does not get paid until Monday therefore she will not be able to pick up patient but would like for patient to be returned home if transportation can be arranged.    Stay Summary:  Per  TTS assessment:Patient was just released from the [facility] yesterday.  He had presented with suicidal ideation and the need for alcohol detox.  Patient has been drinking daily, on average 12 beers and he last drank two days ago and states that he was in withdrawal.  Patient was kept in the [facility] and was started on the Librium Detox Protocol, but was not showing any signs and symptoms of withdrawal in over 36 hours of continuous observation.  Therefore, arrangements were made for him to be admitted to the [facility] on 08/10/2020.  However, upon his arrival there, he told them that he was in alcohol withdrawal and that he was still having suicidal thoughts and they would not admit him.  He states that he did not know anything to do other than to call EMS and go to the ED.  Patient states that he has continued to have suicidal thoughts despite his saying that he was no longer suicidal when he was discharged from the [facility] yesterday.  TTS inquired to what happened to make him suicidal again and he stated, "I guess the thoughts just come and go."  Patient has no current plan for suicide and he denies HI/Psychosis.  Patient states that he feels like he needs inpatient treatment and detox.  He was also recently (within the past month) at the [facility] in [place].  Total Time spent with patient: 30 minutes  Past Psychiatric History: Alcohol induced mood disorder Past Medical History:  Past Medical History:  Diagnosis Date  . Anxiety   . Depression   . Substance abuse (HCC)    History reviewed. No pertinent surgical history. Family History:  Family History  Problem Relation Age of Onset  . Cancer Maternal Grandmother   . Heart disease  Paternal Grandmother   . Heart disease Paternal Grandfather    Family Psychiatric History: None reported Social History:  Social History   Substance and Sexual Activity  Alcohol Use Yes  . Alcohol/week: 10.0 - 12.0 standard drinks  . Types: 10 - 12 Cans  of beer per week   Comment: 2 days ago     Social History   Substance and Sexual Activity  Drug Use Not Currently  . Types: Cocaine, Heroin   Comment: last use 12/17/2018    Social History   Socioeconomic History  . Marital status: Single    Spouse name: Not on file  . Number of children: Not on file  . Years of education: Not on file  . Highest education level: Not on file  Occupational History  . Not on file  Tobacco Use  . Smoking status: Current Every Day Smoker    Packs/day: 1.00    Years: 17.00    Pack years: 17.00    Types: Cigarettes  . Smokeless tobacco: Never Used  Vaping Use  . Vaping Use: Never used  Substance and Sexual Activity  . Alcohol use: Yes    Alcohol/week: 10.0 - 12.0 standard drinks    Types: 10 - 12 Cans of beer per week    Comment: 2 days ago  . Drug use: Not Currently    Types: Cocaine, Heroin    Comment: last use 12/17/2018  . Sexual activity: Not on file  Other Topics Concern  . Not on file  Social History Narrative  . Not on file   Social Determinants of Health   Financial Resource Strain:   . Difficulty of Paying Living Expenses: Not on file  Food Insecurity:   . Worried About Programme researcher, broadcasting/film/video in the Last Year: Not on file  . Ran Out of Food in the Last Year: Not on file  Transportation Needs:   . Lack of Transportation (Medical): Not on file  . Lack of Transportation (Non-Medical): Not on file  Physical Activity:   . Days of Exercise per Week: Not on file  . Minutes of Exercise per Session: Not on file  Stress:   . Feeling of Stress : Not on file  Social Connections:   . Frequency of Communication with Friends and Family: Not on file  . Frequency of Social Gatherings with Friends and Family: Not on file  . Attends Religious Services: Not on file  . Active Member of Clubs or Organizations: Not on file  . Attends Banker Meetings: Not on file  . Marital Status: Not on file   SDOH:  SDOH Screenings    Alcohol Screen:   . Last Alcohol Screening Score (AUDIT): Not on file  Depression (PHQ2-9): Medium Risk  . PHQ-2 Score: 18  Financial Resource Strain:   . Difficulty of Paying Living Expenses: Not on file  Food Insecurity:   . Worried About Programme researcher, broadcasting/film/video in the Last Year: Not on file  . Ran Out of Food in the Last Year: Not on file  Housing:   . Last Housing Risk Score: Not on file  Physical Activity:   . Days of Exercise per Week: Not on file  . Minutes of Exercise per Session: Not on file  Social Connections:   . Frequency of Communication with Friends and Family: Not on file  . Frequency of Social Gatherings with Friends and Family: Not on file  . Attends Religious Services: Not on file  .  Active Member of Clubs or Organizations: Not on file  . Attends Banker Meetings: Not on file  . Marital Status: Not on file  Stress:   . Feeling of Stress : Not on file  Tobacco Use: High Risk  . Smoking Tobacco Use: Current Every Day Smoker  . Smokeless Tobacco Use: Never Used  Transportation Needs:   . Freight forwarder (Medical): Not on file  . Lack of Transportation (Non-Medical): Not on file    Has this patient used any form of tobacco in the last 30 days? (Cigarettes, Smokeless Tobacco, Cigars, and/or Pipes) A prescription for an FDA-approved tobacco cessation medication was offered at discharge and the patient refused  Current Medications:  Current Facility-Administered Medications  Medication Dose Route Frequency Provider Last Rate Last Admin  . acetaminophen (TYLENOL) tablet 650 mg  650 mg Oral Q6H PRN Gillermo Murdoch, NP      . alum & mag hydroxide-simeth (MAALOX/MYLANTA) 200-200-20 MG/5ML suspension 30 mL  30 mL Oral Q4H PRN Gillermo Murdoch, NP      . cloNIDine (CATAPRES) tablet 0.1 mg  0.1 mg Oral Daily Gillermo Murdoch, NP   0.1 mg at 08/11/20 2229  . gabapentin (NEURONTIN) capsule 400 mg  400 mg Oral TID Gillermo Murdoch, NP    400 mg at 08/12/20 0857  . hydrOXYzine (ATARAX/VISTARIL) tablet 25 mg  25 mg Oral Q6H PRN Gillermo Murdoch, NP   25 mg at 08/11/20 2229  . magnesium hydroxide (MILK OF MAGNESIA) suspension 30 mL  30 mL Oral Daily PRN Gillermo Murdoch, NP      . nicotine polacrilex (NICORETTE) gum 2 mg  2 mg Oral PRN Gillermo Murdoch, NP   2 mg at 08/12/20 0857  . PARoxetine (PAXIL) tablet 20 mg  20 mg Oral Once Gillermo Murdoch, NP       Current Outpatient Medications  Medication Sig Dispense Refill  . cloNIDine (CATAPRES) 0.1 MG tablet Take 1 tablet (0.1 mg total) by mouth at bedtime. 30 tablet 0  . gabapentin (NEURONTIN) 400 MG capsule Take 1 capsule (400 mg total) by mouth 3 (three) times daily. 90 capsule 0  . hydrOXYzine (ATARAX/VISTARIL) 25 MG tablet Take 1 tablet (25 mg total) by mouth every 6 (six) hours as needed for anxiety. 30 tablet 0  . nicotine polacrilex (NICORETTE) 2 MG gum Take 1 each (2 mg total) by mouth as needed for smoking cessation. 100 tablet 0  . PARoxetine (PAXIL) 20 MG tablet Take 1 tablet (20 mg total) by mouth daily. 30 tablet 0    PTA Medications: (Not in a hospital admission)   Musculoskeletal  Strength & Muscle Tone: within normal limits Gait & Station: normal Patient leans: N/A  Psychiatric Specialty Exam  Presentation  General Appearance: Appropriate for Environment;Casual  Eye Contact:Good  Speech:Clear and Coherent;Normal Rate  Speech Volume:Normal  Handedness:Right   Mood and Affect  Mood:Euthymic  Affect:Congruent;Appropriate   Thought Process  Thought Processes:Coherent  Descriptions of Associations:Intact  Orientation:Full (Time, Place and Person)  Thought Content:WDL  Hallucinations:No data recorded Ideas of Reference:None  Suicidal Thoughts:No data recorded Homicidal Thoughts:No data recorded  Sensorium  Memory:Immediate Good;Recent Good;Remote Good  Judgment:Fair  Insight:Fair   Executive Functions   Concentration:Good  Attention Span:Good  Recall:Good  Fund of Knowledge:Good  Language:Good   Psychomotor Activity  Psychomotor Activity:No data recorded  Assets  Assets:Communication Skills;Desire for Improvement;Housing;Physical Health;Transportation   Sleep  Sleep:No data recorded  Physical Exam  Physical Exam Vitals and nursing note reviewed.  Constitutional:  Appearance: He is well-developed.  HENT:     Head: Normocephalic.  Cardiovascular:     Rate and Rhythm: Normal rate.  Pulmonary:     Effort: Pulmonary effort is normal.  Neurological:     Mental Status: He is alert and oriented to person, place, and time.  Psychiatric:        Attention and Perception: Attention and perception normal.        Mood and Affect: Mood and affect normal.        Speech: Speech normal.        Behavior: Behavior normal. Behavior is cooperative.        Thought Content: Thought content normal.        Cognition and Memory: Cognition and memory normal.        Judgment: Judgment normal.    Review of Systems  Constitutional: Negative.   HENT: Negative.   Eyes: Negative.   Respiratory: Negative.   Cardiovascular: Negative.   Gastrointestinal: Negative.   Genitourinary: Negative.   Musculoskeletal: Negative.   Skin: Negative.   Neurological: Negative.   Endo/Heme/Allergies: Negative.   Psychiatric/Behavioral: Positive for substance abuse.   Blood pressure (!) 128/93, pulse 95, temperature 97.6 F (36.4 C), temperature source Temporal, resp. rate 18, SpO2 100 %. There is no height or weight on file to calculate BMI.  Demographic Factors:  Male and Caucasian  Loss Factors: NA  Historical Factors: NA  Risk Reduction Factors:   Living with another person, especially a relative, Positive social support, Positive therapeutic relationship and Positive coping skills or problem solving skills  Continued Clinical Symptoms:  Alcohol/Substance  Abuse/Dependencies  Cognitive Features That Contribute To Risk:  None    Suicide Risk:  Minimal: No identifiable suicidal ideation.  Patients presenting with no risk factors but with morbid ruminations; may be classified as minimal risk based on the severity of the depressive symptoms  Plan Of Care/Follow-up recommendations:  Patient reviewed with Dr. Jannifer FranklinAkintayo. Other:  Follow-up with outpatient psychiatry, [facility]  Follow-up with substance use treatment resources provided.  Disposition: Discharge  Patrcia Dollyina L Tate, FNP 08/12/2020, 9:58 AM

## 2020-08-12 NOTE — Discharge Instructions (Addendum)

## 2020-08-12 NOTE — BH Assessment (Addendum)
Per Berneice Heinrich, NP, patient is psych cleared to discharge. SA referrals updated in patient's AVS. Patient also recommended to follow up with [facility] in [facility] for outpatient mental health services. Patient lives in [place], [place] and was in need of transport back home. Armed forces logistics/support/administrative officer and spoke to [employer]". He states that [employer] does not provide transport to patient's homes on the weekends (only week days). Contacted [facility] to see if they provided transport to [facility]. Confirmed that they do not provide transport to that area. No taxi vouchers available at the [facility]. Clinician could only offer a bus pass to patient at this time.

## 2020-08-12 NOTE — ED Notes (Addendum)
Pt watching television at this time. Pt is cooperative. Has been given a sandwich, chips and soda. Breathing even and unlabored will continue to monitor pt

## 2021-03-16 ENCOUNTER — Other Ambulatory Visit: Payer: Self-pay

## 2021-03-16 ENCOUNTER — Emergency Department (HOSPITAL_BASED_OUTPATIENT_CLINIC_OR_DEPARTMENT_OTHER): Payer: Self-pay

## 2021-03-16 ENCOUNTER — Encounter (HOSPITAL_BASED_OUTPATIENT_CLINIC_OR_DEPARTMENT_OTHER): Payer: Self-pay | Admitting: *Deleted

## 2021-03-16 ENCOUNTER — Emergency Department (HOSPITAL_BASED_OUTPATIENT_CLINIC_OR_DEPARTMENT_OTHER)
Admission: EM | Admit: 2021-03-16 | Discharge: 2021-03-16 | Disposition: A | Discharge status: Home / Self Care | Payer: Self-pay | Attending: Emergency Medicine | Admitting: Emergency Medicine

## 2021-03-16 DIAGNOSIS — Z79899 Other long term (current) drug therapy: Secondary | ICD-10-CM | POA: Insufficient documentation

## 2021-03-16 DIAGNOSIS — F17210 Nicotine dependence, cigarettes, uncomplicated: Secondary | ICD-10-CM | POA: Insufficient documentation

## 2021-03-16 DIAGNOSIS — F419 Anxiety disorder, unspecified: Secondary | ICD-10-CM | POA: Insufficient documentation

## 2021-03-16 DIAGNOSIS — R5383 Other fatigue: Secondary | ICD-10-CM | POA: Insufficient documentation

## 2021-03-16 DIAGNOSIS — R42 Dizziness and giddiness: Secondary | ICD-10-CM | POA: Insufficient documentation

## 2021-03-16 DIAGNOSIS — R0789 Other chest pain: Secondary | ICD-10-CM | POA: Insufficient documentation

## 2021-03-16 DIAGNOSIS — Z91010 Allergy to peanuts: Secondary | ICD-10-CM | POA: Insufficient documentation

## 2021-03-16 HISTORY — DX: Alcohol dependence, uncomplicated: F10.20

## 2021-03-16 LAB — COMPREHENSIVE METABOLIC PANEL
ALT: 41 U/L (ref 0–44)
AST: 37 U/L (ref 15–41)
Albumin: 4.5 g/dL (ref 3.5–5.0)
Alkaline Phosphatase: 77 U/L (ref 38–126)
Anion gap: 11 (ref 5–15)
BUN: 12 mg/dL (ref 6–20)
CO2: 21 mmol/L — ABNORMAL LOW (ref 22–32)
Calcium: 9.1 mg/dL (ref 8.9–10.3)
Chloride: 100 mmol/L (ref 98–111)
Creatinine, Ser: 1.08 mg/dL (ref 0.61–1.24)
GFR, Estimated: >60 mL/min (ref >60)
Glucose, Bld: 96 mg/dL (ref 70–99)
Potassium: 3.5 mmol/L (ref 3.5–5.1)
Sodium: 132 mmol/L — ABNORMAL LOW (ref 135–145)
Total Bilirubin: 0.7 mg/dL (ref 0.3–1.2)
Total Protein: 7.7 g/dL (ref 6.5–8.1)

## 2021-03-16 LAB — ETHANOL: Alcohol, Ethyl (B): <10 mg/dL (ref <10)

## 2021-03-16 LAB — CBC WITH DIFFERENTIAL/PLATELET
Abs Immature Granulocytes: 0.08 10*3/uL — ABNORMAL HIGH (ref 0.00–0.07)
Basophils Absolute: 0.1 10*3/uL (ref 0.0–0.1)
Basophils Relative: 1 %
Eosinophils Absolute: 0.2 10*3/uL (ref 0.0–0.5)
Eosinophils Relative: 1 %
HCT: 47 % (ref 39.0–52.0)
Hemoglobin: 15.8 g/dL (ref 13.0–17.0)
Immature Granulocytes: 1 %
Lymphocytes Relative: 15 %
Lymphs Abs: 2.4 10*3/uL (ref 0.7–4.0)
MCH: 31.3 pg (ref 26.0–34.0)
MCHC: 33.6 g/dL (ref 30.0–36.0)
MCV: 93.3 fL (ref 80.0–100.0)
Monocytes Absolute: 1.4 10*3/uL — ABNORMAL HIGH (ref 0.1–1.0)
Monocytes Relative: 9 %
Neutro Abs: 11.3 10*3/uL — ABNORMAL HIGH (ref 1.7–7.7)
Neutrophils Relative %: 73 %
Platelets: 264 10*3/uL (ref 150–400)
RBC: 5.04 MIL/uL (ref 4.22–5.81)
RDW: 13.1 % (ref 11.5–15.5)
WBC: 15.4 10*3/uL — ABNORMAL HIGH (ref 4.0–10.5)
nRBC: 0 % (ref 0.0–0.2)

## 2021-03-16 LAB — TROPONIN I (HIGH SENSITIVITY)
Troponin I (High Sensitivity): 4 ng/L (ref <18)
Troponin I (High Sensitivity): 5 ng/L (ref <18)

## 2021-03-16 MED ORDER — CHLORDIAZEPOXIDE HCL 25 MG PO CAPS: ORAL_CAPSULE | ORAL | 0 refills | Status: DC

## 2021-03-16 MED ORDER — HYDROXYZINE HCL 25 MG PO TABS
50.0000 mg | ORAL_TABLET | Freq: Once | ORAL | Status: AC
  Administered 2021-03-16: 50 mg via ORAL
  Filled 2021-03-16: qty 2

## 2021-03-16 NOTE — ED Triage Notes (Addendum)
States he stopped using alcohol 7 days ago. He continues to feel anxious. He feels panicky like he might have a "heart attack".  He is currently staying at a alcohol tx home. He is not allowed to have narcotics but thinks Gabapentin would help his anxiety.

## 2021-03-16 NOTE — ED Provider Notes (Signed)
[facility] EMERGENCY DEPARTMENT Provider Note   CSN: [id number] Arrival date & time: 03/16/21  1818     History Chief Complaint  Patient presents with  . Anxiety    Carlos Flowers is a 40 y.o. male.  HPI   Patient with significant medical history of alcoholism, anxiety, depression, substance abuse presents with chief complaint of feeling as if he is having a heart attack.  He endorses that he recently stopped drinking alcohol 7 days ago (used to drink a case of beer daily for last 20 years) and has been experiencing withdrawal symptoms.  He endorsed that he feels lightheadedness, dizziness, fatigue, occasionally get chest pain.  He states today it was the worst, he had severe left-sided chest pain, did not radiate, associated with becoming clammy, lightheadedness and dizziness.  Patient has no cardiac history, no history of PEs or DVTs, currently not on hormone therapy, he denies recent illicit drug use, denies leg swelling, recent surgeries or long travels.  Patient denies hallucinations or delusions, denies suicidal or homicidal ideations.  Patient states he was recently discharged from [facility] 5 days ago and was sent to [facility] for substance abuse.  After reviewing patient's chart can find no records of this.  Patient denies headaches, fevers, chills, shortness of breath, abdominal pain, nausea, vomit, diarrhea, worsening pedal edema.  Past Medical History:  Diagnosis Date  . Alcoholism (HCC)   . Anxiety   . Depression   . Substance abuse [facility])     Patient Active Problem List   Diagnosis Date Noted  . Alcohol-induced mood disorder (HCC)     No past surgical history on file.     Family History  Problem Relation Age of Onset  . Cancer Maternal Grandmother   . Heart disease Paternal Grandmother   . Heart disease Paternal Grandfather     Social History   Tobacco Use  . Smoking status: Current Every Day Smoker     Packs/day: 1.00    Years: 17.00    Pack years: 17.00    Types: Cigarettes  . Smokeless tobacco: Never Used  Vaping Use  . Vaping Use: Never used  Substance Use Topics  . Alcohol use: Yes    Alcohol/week: 10.0 - 12.0 standard drinks    Types: 10 - 12 Cans of beer per week  . Drug use: Not Currently    Types: Cocaine, Heroin    Comment: last use 12/17/2018    Home Medications Prior to Admission medications   Medication Sig Start Date End Date Taking? Authorizing Provider  chlordiazePOXIDE (LIBRIUM) 25 MG capsule 50mg  PO TID x 1D, then 25-50mg  PO BID X 1D, then 25-50mg  PO QD X 1D 03/16/21  Yes 03/18/21, PA-C  cloNIDine (CATAPRES) 0.1 MG tablet Take 1 tablet (0.1 mg total) by mouth at bedtime. 08/09/20  Yes 10/09/20, NP  PARoxetine (PAXIL) 20 MG tablet Take 1 tablet (20 mg total) by mouth daily. 08/10/20  Yes 10/10/20, NP  gabapentin (NEURONTIN) 400 MG capsule Take 1 capsule (400 mg total) by mouth 3 (three) times daily. 08/09/20   10/09/20, NP  hydrOXYzine (ATARAX/VISTARIL) 25 MG tablet Take 1 tablet (25 mg total) by mouth every 6 (six) hours as needed for anxiety. 08/09/20   10/09/20, NP  nicotine polacrilex (NICORETTE) 2 MG gum Take 1 each (2 mg total) by mouth as needed for smoking cessation. 08/09/20   10/09/20, NP  Allergies    Peanut-containing drug products  Review of Systems   Review of Systems  Constitutional: Negative for chills and fever.  HENT: Negative for congestion and sore throat.   Eyes: Positive for visual disturbance.  Respiratory: Negative for shortness of breath.   Cardiovascular: Positive for chest pain.  Gastrointestinal: Negative for abdominal pain, diarrhea, nausea and vomiting.  Genitourinary: Negative for enuresis.  Musculoskeletal: Negative for back pain.  Skin: Negative for rash.  Neurological: Positive for dizziness and weakness.  Hematological: Does not bruise/bleed easily.    Physical Exam Updated Vital  Signs BP (!) 152/106   Pulse 88   Temp 98.2 F (36.8 C) (Oral)   Resp 20   Ht 6\' 1"  (1.854 m)   Wt 99.1 kg   SpO2 96%   BMI 28.81 kg/m   Physical Exam Vitals and nursing note reviewed.  Constitutional:      General: He is not in acute distress.    Appearance: He is not ill-appearing.  HENT:     Head: Normocephalic and atraumatic.     Nose: No congestion.  Eyes:     Extraocular Movements: Extraocular movements intact.     Conjunctiva/sclera: Conjunctivae normal.     Pupils: Pupils are equal, round, and reactive to light.  Cardiovascular:     Rate and Rhythm: Normal rate and regular rhythm.     Pulses: Normal pulses.     Heart sounds: No murmur heard. No friction rub. No gallop.   Pulmonary:     Effort: No respiratory distress.     Breath sounds: No wheezing, rhonchi or rales.  Abdominal:     Palpations: Abdomen is soft.     Tenderness: There is no abdominal tenderness.  Musculoskeletal:     Right lower leg: No edema.     Left lower leg: No edema.  Skin:    General: Skin is warm and dry.  Neurological:     Mental Status: He is alert.     GCS: GCS eye subscore is 4. GCS verbal subscore is 5. GCS motor subscore is 6.     Cranial Nerves: No facial asymmetry.     Motor: No weakness.     Coordination: Romberg sign negative. Finger-Nose-Finger Test normal.     Comments: Cranial nerves II through XII are grossly intact  Patient have no difficulty with word finding.  Psychiatric:        Mood and Affect: Mood normal.     ED Results / Procedures / Treatments   Labs (all labs ordered are listed, but only abnormal results are displayed) Labs Reviewed  COMPREHENSIVE METABOLIC PANEL - Abnormal; Notable for the following components:      Result Value   Sodium 132 (*)    CO2 21 (*)    All other components within normal limits  CBC WITH DIFFERENTIAL/PLATELET - Abnormal; Notable for the following components:   WBC 15.4 (*)    Neutro Abs 11.3 (*)    Monocytes Absolute 1.4  (*)    Abs Immature Granulocytes 0.08 (*)    All other components within normal limits  ETHANOL  TROPONIN I (HIGH SENSITIVITY)    EKG None  Radiology No results found.  Procedures Procedures   Medications Ordered in ED Medications  hydrOXYzine (ATARAX/VISTARIL) tablet 50 mg (50 mg Oral Given 03/16/21 1931)    ED Course  I have reviewed the triage vital signs and the nursing notes.  Pertinent labs & imaging results that were available during my care  of the patient were reviewed by me and considered in my medical decision making (see chart for details).    MDM Rules/Calculators/A&P                         Initial impression-patient presents with chest pain.  He is alert, does not appear in acute distress, vital signs reassuring.  Suspect chest pain is multifactorial withdrawal symptoms with anxiety, will order chest pain work-up, EKG, chest x-ray, will also add CT head as he endorses lightheaded and dizziness for last couple days.  Will give patient  Vistaril and reassess.  Work-up-lab work and imaging pending at this time.  Rule out-low suspicion for intracranial head bleed or mass as patient has no neuro deficits on my exam, head CT pending at this time. low  Suspicion for systemic infection as patient is nontoxic-appearing, vital signs reassuring.  No obvious source of infection on my exam.  Low suspicion for ACS as history is atypical, patient has low risk factors, EKG without signs of ischemia.  Troponin is pending at this time.  Low suspicion for PE as patient is PERC negative.  Low suspicion for AAA patient has low risk factors, history is atypical.  Low suspicion for psychiatric emergency as he did denies hallucinations, delusions, suicidal homicidal ideations.  Plan-due to shift change patient will be handed to [facility], she is to provide HPI, current work-up, likely disposition.  I suspect patient suffering from withdraws which would explain his  lightheadedness, dizziness and clamminess.  Suspect his chest pain is multifactorial but mainly stemming from anxiety exacerbated by withdrawal symptoms.  I recommend obtaining delta troponin as chest pain started less than 4 hours ago, follow-up on head CT, and lab work.  If lab and imaging unremarkable, patient feels better after Vistaril, he can be discharged  on Librium taper, encourage continuing treatment at alcohol  facility.    Final Clinical Impression(s) / ED Diagnoses Final diagnoses:  Atypical chest pain    Rx / DC Orders ED Discharge Orders         Ordered    chlordiazePOXIDE (LIBRIUM) 25 MG capsule        03/16/21 1952           Carroll Sage, PA-C 03/16/21 2001    Melene Plan, DO 03/16/21 2204

## 2021-03-16 NOTE — ED Provider Notes (Signed)
I assumed care of patient at shift change, please see their note for full H&P.  Briefly plan is to follow-up on pending studies, delta troponin anticipating discharge.  Physical Exam  BP 130/82   Pulse 71   Temp 98.2 F (36.8 C) (Oral)   Resp 16   Ht 6\' 1"  (1.854 m)   Wt 99.1 kg   SpO2 96%   BMI 28.81 kg/m   Patient is lying in bed.  He is awake and alert in no obvious distress or tremors. We discussed his results and he is agreeable for discharge.  Labs Reviewed  COMPREHENSIVE METABOLIC PANEL - Abnormal; Notable for the following components:      Result Value   Sodium 132 (*)    CO2 21 (*)    All other components within normal limits  CBC WITH DIFFERENTIAL/PLATELET - Abnormal; Notable for the following components:   WBC 15.4 (*)    Neutro Abs 11.3 (*)    Monocytes Absolute 1.4 (*)    Abs Immature Granulocytes 0.08 (*)    All other components within normal limits  ETHANOL  TROPONIN I (HIGH SENSITIVITY)  TROPONIN I (HIGH SENSITIVITY)    DG Chest 1 View  Result Date: 03/16/2021 CLINICAL DATA:  Chest pain. EXAM: CHEST  1 VIEW COMPARISON:  Chest radiograph 12/30/2018 FINDINGS: The cardiomediastinal contours are within normal limits. The lungs are clear. No pneumothorax or pleural effusion. No acute finding in the visualized skeleton. IMPRESSION: No acute cardiopulmonary process. Electronically Signed   By: 01/01/2019 M.D.   On: 03/16/2021 20:08   CT Head Wo Contrast  Result Date: 03/16/2021 CLINICAL DATA:  Dizziness EXAM: CT HEAD WITHOUT CONTRAST TECHNIQUE: Contiguous axial images were obtained from the base of the skull through the vertex without intravenous contrast. COMPARISON:  None. FINDINGS: Brain: No evidence of acute infarction, hemorrhage, hydrocephalus, extra-axial collection or mass lesion/mass effect. Prominent CSF space is noted on the right along the medial aspect of the right temporal lobe. Vascular: No hyperdense vessel or unexpected calcification. Skull:  Normal. Negative for fracture or focal lesion. Sinuses/Orbits: No acute finding. Other: None. IMPRESSION: No acute intracranial abnormality noted. Electronically Signed   By: 03/18/2021 M.D.   On: 03/16/2021 20:25    Return precautions were discussed with patient who states their understanding.  At the time of discharge patient denied any unaddressed complaints or concerns.  Patient is agreeable for discharge home.  Note: Portions of this report may have been transcribed using voice recognition software. Every effort was made to ensure accuracy; however, inadvertent computerized transcription errors may be present    03/18/2021, PA-C 03/16/21 2314    03/18/21, DO 03/18/21 1031

## 2021-03-18 ENCOUNTER — Emergency Department (HOSPITAL_BASED_OUTPATIENT_CLINIC_OR_DEPARTMENT_OTHER): Payer: Self-pay

## 2021-03-18 ENCOUNTER — Other Ambulatory Visit: Payer: Self-pay

## 2021-03-18 ENCOUNTER — Encounter (HOSPITAL_BASED_OUTPATIENT_CLINIC_OR_DEPARTMENT_OTHER): Payer: Self-pay | Admitting: Emergency Medicine

## 2021-03-18 ENCOUNTER — Emergency Department (HOSPITAL_BASED_OUTPATIENT_CLINIC_OR_DEPARTMENT_OTHER)
Admission: EM | Admit: 2021-03-18 | Discharge: 2021-03-18 | Disposition: A | Discharge status: Home / Self Care | Payer: Self-pay | Attending: Emergency Medicine | Admitting: Emergency Medicine

## 2021-03-18 DIAGNOSIS — F17210 Nicotine dependence, cigarettes, uncomplicated: Secondary | ICD-10-CM | POA: Insufficient documentation

## 2021-03-18 DIAGNOSIS — R0789 Other chest pain: Secondary | ICD-10-CM

## 2021-03-18 DIAGNOSIS — R002 Palpitations: Secondary | ICD-10-CM | POA: Insufficient documentation

## 2021-03-18 DIAGNOSIS — R079 Chest pain, unspecified: Secondary | ICD-10-CM | POA: Insufficient documentation

## 2021-03-18 DIAGNOSIS — R251 Tremor, unspecified: Secondary | ICD-10-CM | POA: Insufficient documentation

## 2021-03-18 DIAGNOSIS — Z91010 Allergy to peanuts: Secondary | ICD-10-CM | POA: Insufficient documentation

## 2021-03-18 DIAGNOSIS — R0602 Shortness of breath: Secondary | ICD-10-CM | POA: Insufficient documentation

## 2021-03-18 DIAGNOSIS — F1010 Alcohol abuse, uncomplicated: Secondary | ICD-10-CM | POA: Insufficient documentation

## 2021-03-18 LAB — COMPREHENSIVE METABOLIC PANEL
ALT: 39 U/L (ref 0–44)
AST: 31 U/L (ref 15–41)
Albumin: 4.7 g/dL (ref 3.5–5.0)
Alkaline Phosphatase: 80 U/L (ref 38–126)
Anion gap: 13 (ref 5–15)
BUN: 13 mg/dL (ref 6–20)
CO2: 21 mmol/L — ABNORMAL LOW (ref 22–32)
Calcium: 9.4 mg/dL (ref 8.9–10.3)
Chloride: 99 mmol/L (ref 98–111)
Creatinine, Ser: 0.94 mg/dL (ref 0.61–1.24)
GFR, Estimated: >60 mL/min (ref >60)
Glucose, Bld: 113 mg/dL — ABNORMAL HIGH (ref 70–99)
Potassium: 3.5 mmol/L (ref 3.5–5.1)
Sodium: 133 mmol/L — ABNORMAL LOW (ref 135–145)
Total Bilirubin: 0.9 mg/dL (ref 0.3–1.2)
Total Protein: 8.5 g/dL — ABNORMAL HIGH (ref 6.5–8.1)

## 2021-03-18 LAB — CBC WITH DIFFERENTIAL/PLATELET
Abs Immature Granulocytes: 0.07 10*3/uL (ref 0.00–0.07)
Basophils Absolute: 0.1 10*3/uL (ref 0.0–0.1)
Basophils Relative: 1 %
Eosinophils Absolute: 0.1 10*3/uL (ref 0.0–0.5)
Eosinophils Relative: 1 %
HCT: 47.7 % (ref 39.0–52.0)
Hemoglobin: 16.2 g/dL (ref 13.0–17.0)
Immature Granulocytes: 1 %
Lymphocytes Relative: 17 %
Lymphs Abs: 2.4 10*3/uL (ref 0.7–4.0)
MCH: 31.5 pg (ref 26.0–34.0)
MCHC: 34 g/dL (ref 30.0–36.0)
MCV: 92.6 fL (ref 80.0–100.0)
Monocytes Absolute: 1.1 10*3/uL — ABNORMAL HIGH (ref 0.1–1.0)
Monocytes Relative: 7 %
Neutro Abs: 10.4 10*3/uL — ABNORMAL HIGH (ref 1.7–7.7)
Neutrophils Relative %: 73 %
Platelets: 284 10*3/uL (ref 150–400)
RBC: 5.15 MIL/uL (ref 4.22–5.81)
RDW: 12.9 % (ref 11.5–15.5)
WBC: 14.1 10*3/uL — ABNORMAL HIGH (ref 4.0–10.5)
nRBC: 0 % (ref 0.0–0.2)

## 2021-03-18 LAB — TROPONIN I (HIGH SENSITIVITY): Troponin I (High Sensitivity): 2 ng/L (ref <18)

## 2021-03-18 MED ORDER — HYDROXYZINE HCL 25 MG PO TABS
25.0000 mg | ORAL_TABLET | Freq: Once | ORAL | Status: AC
  Administered 2021-03-18: 25 mg via ORAL
  Filled 2021-03-18: qty 1

## 2021-03-18 MED ORDER — HYDROXYZINE HCL 25 MG PO TABS: 25.0000 mg | ORAL_TABLET | Freq: Four times a day (QID) | ORAL | 0 refills | Status: DC | PRN

## 2021-03-18 MED ORDER — LORAZEPAM 2 MG/ML IJ SOLN: 1.0000 mg | Freq: Once | INTRAMUSCULAR | Status: DC

## 2021-03-18 NOTE — ED Provider Notes (Signed)
Emergency Department Provider Note   I have reviewed the triage vital signs and the nursing notes.   HISTORY  Chief Complaint Anxiety   HPI Carlos Flowers is a 40 y.o. male presents to the ED with anxiety symptoms. He is currently staying in a sober house for EtOH treatment. He has had some  mild tremors in the detox process but notes intermittent anxiety symptoms with associated CP and SOB along with palpitations. He was concerned that he may pass out and so presents here for treatment. He is unable to take Librium due to sober house rules. No prior history of EtOH withdrawal (complicated). Currently 6-7 days sober.    Past Medical History:  Diagnosis Date  . Alcoholism (HCC)   . Anxiety   . Depression   . Substance abuse [facility])     Patient Active Problem List   Diagnosis Date Noted  . Alcohol-induced mood disorder (HCC)     History reviewed. No pertinent surgical history.  Allergies Peanut-containing drug products  Family History  Problem Relation Age of Onset  . Cancer Maternal Grandmother   . Heart disease Paternal Grandmother   . Heart disease Paternal Grandfather     Social History Social History   Tobacco Use  . Smoking status: Current Every Day Smoker    Packs/day: 1.00    Years: 17.00    Pack years: 17.00    Types: Cigarettes  . Smokeless tobacco: Never Used  Vaping Use  . Vaping Use: Never used  Substance Use Topics  . Alcohol use: Yes    Alcohol/week: 10.0 - 12.0 standard drinks    Types: 10 - 12 Cans of beer per week  . Drug use: Not Currently    Types: Cocaine, Heroin    Comment: last use 12/17/2018    Review of Systems  Constitutional: No fever/chills Eyes: No visual changes. ENT: No sore throat. Cardiovascular: Positive chest pain. Respiratory: Positive shortness of breath. Gastrointestinal: No abdominal pain. Denies nausea, no vomiting.  No diarrhea.  No constipation. Genitourinary: Negative for dysuria. Musculoskeletal:  Negative for back pain. Skin: Negative for rash. Neurological: Negative for headaches, focal weakness or numbness.  10-point ROS otherwise negative.  ____________________________________________   PHYSICAL EXAM:  VITAL SIGNS: ED Triage Vitals  Enc Vitals Group     BP 03/18/21 1454 (!) 153/109     Pulse Rate 03/18/21 1454 (!) 102     Resp 03/18/21 1454 18     Temp 03/18/21 1454 98.1 F (36.7 C)     Temp Source 03/18/21 1454 Oral     SpO2 03/18/21 1454 98 %   Constitutional: Alert and oriented. Well appearing and in no acute distress. Eyes: Conjunctivae are normal.  Head: Atraumatic. Nose: No congestion/rhinnorhea. Mouth/Throat: Mucous membranes are moist.  Neck: No stridor.   Cardiovascular: Normal rate, regular rhythm. Good peripheral circulation. Grossly normal heart sounds.   Respiratory: Normal respiratory effort.  No retractions. Lungs CTAB. Gastrointestinal: Soft and nontender. No distention.  Musculoskeletal: No lower extremity tenderness nor edema. No gross deformities of extremities. Neurologic:  Normal speech and language. No gross focal neurologic deficits are appreciated. No tremor.  Skin:  Skin is warm, dry and intact. No rash noted.  ____________________________________________   LABS (all labs ordered are listed, but only abnormal results are displayed)  Labs Reviewed  COMPREHENSIVE METABOLIC PANEL - Abnormal; Notable for the following components:      Result Value   Sodium 133 (*)    CO2 21 (*)  Glucose, Bld 113 (*)    Total Protein 8.5 (*)    All other components within normal limits  CBC WITH DIFFERENTIAL/PLATELET - Abnormal; Notable for the following components:   WBC 14.1 (*)    Neutro Abs 10.4 (*)    Monocytes Absolute 1.1 (*)    All other components within normal limits  TROPONIN I (HIGH SENSITIVITY)  TROPONIN I (HIGH SENSITIVITY)   ____________________________________________  EKG   EKG Interpretation  Date/Time:  Sunday March 18 2021 14:57:10 EDT Ventricular Rate:  106 PR Interval:  132 QRS Duration: 86 QT Interval:  350 QTC Calculation: 464 R Axis:   101 Text Interpretation: Sinus tachycardia Rightward axis Borderline ECG Confirmed by Alona Bene [phone]) on 03/19/2021 5:37:54 PM       ____________________________________________  RADIOLOGY  CXR negative.  ____________________________________________   PROCEDURES  Procedure(s) performed:   Procedures  None ____________________________________________   INITIAL IMPRESSION / ASSESSMENT AND PLAN / ED COURSE  Pertinent labs & imaging results that were available during my care of the patient were reviewed by me and considered in my medical decision making (see chart for details).   Patient with anxiety and CP symptoms. Currently 5 days sober from EtOH. CIWA clinically low. Normal vitals. Few specific withdrawal symptoms. Offered Librium but patient is unable to take at his current sober house. Is ok with Atarax for anxiety. Was given this in the ED with improvement.   Troponin negative x 1. Constant CP since 9 AM. Do not plan on troponin trending. EKG reviewed. CXR with no acute process. Vitals remain WNL.   Plan for discharge to continue outpatient detox. Few withdrawal symptoms currently. No complicated withdrawal history. Do not see an indication for inpatient detox.     ____________________________________________  FINAL CLINICAL IMPRESSION(S) / ED DIAGNOSES  Final diagnoses:  Chest tightness  Alcohol abuse     MEDICATIONS GIVEN DURING THIS VISIT:  Medications  hydrOXYzine (ATARAX/VISTARIL) tablet 25 mg (25 mg Oral Given 03/18/21 1630)     Note:  This document was prepared using Dragon voice recognition software and may include unintentional dictation errors.  Alona Bene, MD, [facility] Emergency Medicine    Trong Gosling, Arlyss Repress, MD 03/19/21 [id number]

## 2021-03-18 NOTE — ED Triage Notes (Signed)
Emergency Medicine Provider Triage Evaluation Note  Carlos Flowers , a 40 y.o. male  was evaluated in triage.  Pt complains of left-sided chest pain, lightheadedness, shortness of breath, anxiety.  Patient's chest pain began at 9:00 this morning has been constant since then.  No alleviating or aggravating factors.  Endorses is associated shortness of breath.  No associated nausea, vomiting, diaphoresis.   Patient has not had any alcohol in the last 5 days.  Was prescribed Librium taper yesterday however unable to pick up daily his housing situation.   Patient was seen yesterday for the same complaints, unremarkable work-up.  Patient reports that his pain today is worse than it was yesterday.  Denies any homicidal ideations, suicidal ideations, visual hallucinations, auditory hallucinations, tactile hallucinations, syncopal episode.  Review of Systems  Positive: Chest pain, shortness of breath, lightheadedness, anxiety Negative: homicidal ideations, suicidal ideations, visual hallucinations, auditory hallucinations, tactile hallucinations, syncopal episode  Physical Exam  BP (!) 153/109 (BP Location: Left Arm)   Pulse (!) 102   Temp 98.1 F (36.7 C) (Oral)   Resp 18   SpO2 98%  Gen:   Awake, no distress   HEENT:  Atraumatic  Resp:  Normal effort, lungs clear to auscultation bilaterally Cardiac:  Cardiac at rate of 112 Abd:   Nondistended, nontender  MSK:   Moves extremities without difficulty  Neuro:  Speech clear   Medical Decision Making  Medically screening exam initiated at 3:10 PM.  Appropriate orders placed.  Carlos Flowers was informed that the remainder of the evaluation will be completed by another provider, this initial triage assessment does not replace that evaluation, and the importance of remaining in the ED until their evaluation is complete.  Clinical Impression   The patient appears stable so that the remainder of the work up may be completed by another  provider.      Haskel Schroeder, [place] 03/18/21 1512

## 2021-03-18 NOTE — Discharge Instructions (Signed)
Substance Abuse Treatment Programs ° °Intensive Outpatient Programs °[facility]     °[address]      °[place], [place]                   °[phone]      ° °[facility] °[address] °[place], [place] °[phone] ° °[facility]     °(Inpatient and outpatient)     °[address]           °[phone]   ° °[facility] °336-288-1484 (Suboxone and Methadone) ° °[address]      °[place], [place] [address]      °336-882-2125      ° °[address] °[place], [place] °852-3033 ° °[facility] (Outpatient/Inpatient, Chemical)    °(insurance only) [phone]      °       °[facility] (Groups & Residential) °[place], [place] °336-389-1413 ° °   °[facility]     °[address]     °[place], [place]      °336-389-1413      ° °[facility] (for caregivers and family) °[address] °[place], [place] °336-299-4655 ° ° ° ° ° °Residential Treatment Programs °[facility]      °[address], [place], [place] [address]  °([phone]      ° °[facility] °[address] [place], [place] [address] °[phone] ° °[facility]        °336-248-8914      ° °[facility] °1-800-659-3381 ° °[facility]             °[address]                                         °[place], [place]                                                °[phone] or 336-784-9470                              ° °[facility] [place] °[address] °[place], [address] °[phone] ° °[facility]    °[address]      °[place], [place]     °[phone]      ° °[facility] °[address] °, [place] °336-285-9073 ° °[facility]   °[address]     °[place], [place] [address]     °[phone]      °Admissions: 8am-3pm M-F ° °[facility] °[address] °[place],  [place] °336-227-7417 ° °[facility]: Residential Program (90 Days)   °[place], [place]      °336-725-8389 or 800-758-6077    ° °[facility] °[place], [place] °(Walk in Hours over the weekend or by referral) ° °[facility] °[address], [place], [place] [address] °([phone] ° °Crisis Mobile: [facility]:  1-877-626-1772 (for crisis response 24 hours a day) °[facility] Hotline:      1-800-256-2452 °Outpatient Psychiatry and Counseling ° °[facility]: Mobile Crisis   Management 24 hours:  1-877-626-1772 ° °[facility] sliding scale fee and walk in schedule: M-F 8am-12pm/1pm-3pm °[address]  °[place], [place] [address] °[phone] ° °[facility] °[address] °[place], [place] [address] °336-703-9650 ° °[facility] ([facility] new patient walk-in appointments available Monday - Friday 8am -3pm.          °[address] °[place], [place] [address] °336-676-6840 or crisis line- 336-676-6905 ° °[facility] Intensive Outpatient Therapy Program °[address] °[place], [place] [address] °336-832-9804 ° °[facility]                  °Crisis Services      °[phone]      °[address]     °[place], [place] [address]                ° °[facility]   °[facility] °[phone] °[address] °[place], [place] [address] ° ° °[facility]          °[address]  °[place], [place] [address]       °([phone] ° °[facility] °[address] °[place], [place] [address] °336-292-1510 ° °[facility]    °[address]    °[place], [place] [address]     °[phone]      ° °Parish McKinney, MD     °[address]     °[place] [address]     °336-282-1251     °  °[facility] °[address] °[place] [address] ° °[facility]     °[address]     °[place], [place]      °336-542-2076      ° °[facility] °Shamsher Ahluwalia, MD °[address] °[place], [place] [address] °336-420-9558 ° °[facility]     °[address]     °[place], [place] [address]     °336-274-1237      ° °[facility] °[address] °[place], [place] [address] °336-854-4450 °Resources for Temporary Residential Assistance/Crisis Centers ° °DAY CENTERS °[facility] °M-F 8am-3pm   °[address] [place], [place] [address]   336-332-0824 °Services include: laundry, barbering, support groups, case management, phone  & computer access, showers, AA/NA mtgs, mental health/substance abuse nurse, job skills class, disability information, VA assistance, spiritual classes, etc.  ° °HOMELESS SHELTERS ° °[facility]     °[facility]   °[address], [place]     °[phone]       °       °[facility] (women and children)       °[address] °[place], [place] [address] °336-275-0820 °[email or web address] for application and process °Application Required ° °[facility]   °[address]    °[place] [address]     °[phone]       °             °[facility] °[address] °[place], [place] [address] °336.273.5572 °336-235-0363(schedule application appt.) °Application Required ° °[facility] (women only)    °[address]     °[place], [place] [address]     °336-884-1039      °  Intake starts 6pm daily °Need valid ID, SSC, & Police report °[facility] [place] °[address] °[place], [place] °336-881-5420 °Application Required ° °[facility] (men only)     °[address]      °[place], [place]     °[phone]      ° °[facility] °(Pregnant women only) °[address] °[place], [place] °336-275-0206 ° °[facility]      °[address]      °[place], [place] [address]     °336-722-9951      °       °[facility] °[address] °[place], [place] °336-723-1848 °90 day commitment/SA/Application process ° °[facility](men only)     °[address]     °[place], [place]     °[phone]       °Check-in at 7pm     °       °[facility] °[address] °[place], [place] [address] °336-248-6684 °Men/Women/Women and Children must be there by 7 pm ° °[facility] °[place], [place] °336-722-8721                ° °

## 2021-03-18 NOTE — ED Notes (Signed)
XR at bedside

## 2021-03-18 NOTE — ED Triage Notes (Signed)
Lightheaded, anxiety , chest pain . Alcoholic, stopped 5 days ago.

## 2021-03-21 ENCOUNTER — Encounter (HOSPITAL_COMMUNITY): Payer: Self-pay | Admitting: Emergency Medicine

## 2021-03-21 ENCOUNTER — Emergency Department (HOSPITAL_COMMUNITY)
Admission: EM | Admit: 2021-03-21 | Discharge: 2021-03-22 | Disposition: A | Discharge status: Home / Self Care | Payer: Self-pay | Attending: Emergency Medicine | Admitting: Emergency Medicine

## 2021-03-21 ENCOUNTER — Other Ambulatory Visit: Payer: Self-pay

## 2021-03-21 ENCOUNTER — Emergency Department (HOSPITAL_COMMUNITY): Payer: Self-pay

## 2021-03-21 ENCOUNTER — Ambulatory Visit (HOSPITAL_COMMUNITY)
Admission: AD | Admit: 2021-03-21 | Discharge: 2021-03-21 | Disposition: A | Discharge status: Home / Self Care | Payer: No Payment, Other | Attending: Psychiatry | Admitting: Psychiatry

## 2021-03-21 DIAGNOSIS — R062 Wheezing: Secondary | ICD-10-CM | POA: Insufficient documentation

## 2021-03-21 DIAGNOSIS — R079 Chest pain, unspecified: Secondary | ICD-10-CM | POA: Insufficient documentation

## 2021-03-21 DIAGNOSIS — R45851 Suicidal ideations: Secondary | ICD-10-CM

## 2021-03-21 DIAGNOSIS — F17210 Nicotine dependence, cigarettes, uncomplicated: Secondary | ICD-10-CM | POA: Insufficient documentation

## 2021-03-21 DIAGNOSIS — Z20822 Contact with and (suspected) exposure to covid-19: Secondary | ICD-10-CM | POA: Insufficient documentation

## 2021-03-21 DIAGNOSIS — F410 Panic disorder [episodic paroxysmal anxiety] without agoraphobia: Secondary | ICD-10-CM | POA: Insufficient documentation

## 2021-03-21 DIAGNOSIS — Z91010 Allergy to peanuts: Secondary | ICD-10-CM | POA: Insufficient documentation

## 2021-03-21 DIAGNOSIS — Y909 Presence of alcohol in blood, level not specified: Secondary | ICD-10-CM | POA: Insufficient documentation

## 2021-03-21 LAB — CBC
HCT: 49.3 % (ref 39.0–52.0)
Hemoglobin: 16.3 g/dL (ref 13.0–17.0)
MCH: 31.5 pg (ref 26.0–34.0)
MCHC: 33.1 g/dL (ref 30.0–36.0)
MCV: 95.2 fL (ref 80.0–100.0)
Platelets: 261 10*3/uL (ref 150–400)
RBC: 5.18 MIL/uL (ref 4.22–5.81)
RDW: 13.1 % (ref 11.5–15.5)
WBC: 8.2 10*3/uL (ref 4.0–10.5)
nRBC: 0 % (ref 0.0–0.2)

## 2021-03-21 LAB — COMPREHENSIVE METABOLIC PANEL
ALT: 30 U/L (ref 0–44)
AST: 28 U/L (ref 15–41)
Albumin: 5.1 g/dL — ABNORMAL HIGH (ref 3.5–5.0)
Alkaline Phosphatase: 83 U/L (ref 38–126)
Anion gap: 12 (ref 5–15)
BUN: 13 mg/dL (ref 6–20)
CO2: 21 mmol/L — ABNORMAL LOW (ref 22–32)
Calcium: 9.6 mg/dL (ref 8.9–10.3)
Chloride: 102 mmol/L (ref 98–111)
Creatinine, Ser: 1.02 mg/dL (ref 0.61–1.24)
GFR, Estimated: >60 mL/min (ref >60)
Glucose, Bld: 101 mg/dL — ABNORMAL HIGH (ref 70–99)
Potassium: 4 mmol/L (ref 3.5–5.1)
Sodium: 135 mmol/L (ref 135–145)
Total Bilirubin: 0.9 mg/dL (ref 0.3–1.2)
Total Protein: 9.2 g/dL — ABNORMAL HIGH (ref 6.5–8.1)

## 2021-03-21 LAB — RAPID URINE DRUG SCREEN, HOSP PERFORMED
Amphetamines: NOT DETECTED
Barbiturates: NOT DETECTED
Benzodiazepines: NOT DETECTED
Cocaine: NOT DETECTED
Opiates: NOT DETECTED
Tetrahydrocannabinol: NOT DETECTED

## 2021-03-21 LAB — ETHANOL: Alcohol, Ethyl (B): <10 mg/dL (ref <10)

## 2021-03-21 LAB — RESP PANEL BY RT-PCR (FLU A&B, COVID) ARPGX2
Influenza A by PCR: NEGATIVE
Influenza B by PCR: NEGATIVE
SARS Coronavirus 2 by RT PCR: NEGATIVE

## 2021-03-21 LAB — ACETAMINOPHEN LEVEL: Acetaminophen (Tylenol), Serum: <10 ug/mL — ABNORMAL LOW (ref 10–30)

## 2021-03-21 LAB — SALICYLATE LEVEL: Salicylate Lvl: <7 mg/dL — ABNORMAL LOW (ref 7.0–30.0)

## 2021-03-21 MED ORDER — LORAZEPAM 2 MG/ML IJ SOLN: 0.0000 mg | Freq: Four times a day (QID) | INTRAMUSCULAR | Status: DC

## 2021-03-21 MED ORDER — LORAZEPAM 1 MG PO TABS
1.0000 mg | ORAL_TABLET | Freq: Once | ORAL | Status: AC
  Administered 2021-03-21: 1 mg via ORAL
  Filled 2021-03-21: qty 1

## 2021-03-21 MED ORDER — THIAMINE HCL 100 MG PO TABS
100.0000 mg | ORAL_TABLET | Freq: Every day | ORAL | Status: DC
  Administered 2021-03-21: 100 mg via ORAL
  Filled 2021-03-21: qty 1

## 2021-03-21 MED ORDER — LORAZEPAM 1 MG PO TABS: 0.0000 mg | ORAL_TABLET | Freq: Two times a day (BID) | ORAL | Status: DC | PRN

## 2021-03-21 MED ORDER — NICOTINE 21 MG/24HR TD PT24
21.0000 mg | MEDICATED_PATCH | Freq: Once | TRANSDERMAL | Status: DC
  Administered 2021-03-21: 21 mg via TRANSDERMAL
  Filled 2021-03-21: qty 1

## 2021-03-21 MED ORDER — LORAZEPAM 2 MG/ML IJ SOLN: 0.0000 mg | Freq: Two times a day (BID) | INTRAMUSCULAR | Status: DC | PRN

## 2021-03-21 MED ORDER — LORAZEPAM 1 MG PO TABS
0.0000 mg | ORAL_TABLET | Freq: Four times a day (QID) | ORAL | Status: DC | PRN
  Administered 2021-03-21: 2 mg via ORAL
  Filled 2021-03-21: qty 2

## 2021-03-21 MED ORDER — LORAZEPAM 1 MG PO TABS: 0.0000 mg | ORAL_TABLET | Freq: Two times a day (BID) | ORAL | Status: DC

## 2021-03-21 MED ORDER — LORAZEPAM 1 MG PO TABS: 0.0000 mg | ORAL_TABLET | Freq: Four times a day (QID) | ORAL | Status: DC

## 2021-03-21 MED ORDER — LORAZEPAM 2 MG/ML IJ SOLN: 0.0000 mg | Freq: Four times a day (QID) | INTRAMUSCULAR | Status: DC | PRN

## 2021-03-21 MED ORDER — LORAZEPAM 2 MG/ML IJ SOLN
0.0000 mg | Freq: Two times a day (BID) | INTRAMUSCULAR | Status: DC
Start: 2021-03-23 — End: 2021-03-21

## 2021-03-21 MED ORDER — THIAMINE HCL 100 MG/ML IJ SOLN: 100.0000 mg | Freq: Every day | INTRAMUSCULAR | Status: DC

## 2021-03-21 NOTE — BH Assessment (Addendum)
Comprehensive Clinical Assessment (CCA) Note  03/21/2021 Carlos Flowers [id number]   DISPOSITION: MSE completed by provider Vernard Gambles, RN). TTS assessment completed by this Clinician.  Carlos Flowers to be transported to [facility] via [employer]. He initially presented as a walk in to [facility] for depression, suicidal ideations w/ plan to cut wrist, anxiety, depression, and alcohol use. He reports some withdrawal symptoms currently: sweats and anxiety. He is not appropriate for [facility] and will be sent to [facility] due to hx of Alcohol induced seizures. Patient to be medically cleared at [facility]. In the event that patient medically clears, Disposition Counselor Carlos Flowers) requested to seek appropriate placement for this patient. Per [facility] ([facility]), [facility] has no beds available at this time. Charge nurse Kennyth Arnold, RN) and EDP (Dr. Audley Hose) made aware of the recommended plan of care. [facility] called and will transport patient to [facility] sitter will accompany patient). EDP notified of sitter recommendations via secure chat.   The patient demonstrates the following risk factors for suicide: Chronic risk factors for suicide include: psychiatric disorder of Major Depressive Disorder, Recurrent, Severe, without psychotic features and Anxiety Disorder, substance use disorder and previous suicide attempts (3 prior suicide attempts). Acute risk factors for suicide include: family or marital conflict, unemployment, loss (financial, interpersonal, professional) and recent discharge from inpatient psychiatry. Protective factors for this patient include: religious beliefs against suicide. Considering these factors, the overall suicide risk at this point appears to be high. Patient is not appropriate for outpatient follow up.   Therefore, a 1:1 sitter for suicide precautions is recommended. The ER MD has been informed through secure chat.  Flowsheet Row OP [facility] from 03/18/2021 in [facility] from [facility]/2021 [facility]  C-SSRS RISK CATEGORY High Risk No Risk No Risk     Carlos Flowers is a 40 year male that presents to [facility] as a walk-in. He is a self referral and voluntary. His complaint today is suicidal ideations, depression, anxiety, and anxiety. Patient with worsening depressive symptoms starting several months ago. Current depressive symptoms include irritability/anger, guilt, worthlessness, tearful, wanting to be alone, and unable to get out of the bed. He is suicidal with a plan to cut his wrist. He not only has a plan but has intent. Unable to contract for safety.  Patient has access to means: sharp objects and medications. Patient with a history of 3 suicide attempts by overdose. His last suicide attempt was over a year ago. Previous suicide attempts and current suicidal ideations are triggered by "stress in life", addiction, and anxiety. He has a family history of mental health illness stating that his mother was dx's with anxiety and depression. He has no history of abuse-sexual, emotional, verbal.  Patient's support system is his girlfriend. His relationship with his parents are estranged and he has #1 sister. Patient currently is living "place to place".  Highest level of education is some college. 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   Patient denies HI and AVH's. Patient with history of alcohol use. His age of first use is 40 yrs old . He reports daily use for the past 3 years drinking 12 beers or 1 case of beer daily. Lat use was 2 days ago and he consumed 1 case of beer.  He has participated in outpatient residential treatment at [facility] and [facility] (5-6 years  ago). Patient does not have an outpatient therapist and/or psychiatrist. However, has previously [facility]. He was   prescribed Gabapentin in the past. He has not taken this prescribed medication in 5-6 years.   Patient is alert and oriented x5. Speech is normal. Affect is depressed and sad. Insight and judgement are fair. Impulse control is fair. Memory is recent and remote intact. Patient does not appear paranoid or to be responding to internal stimuli.    Chief Complaint:  Chief Complaint  Patient presents with  . Psychiatric Evaluation   Visit Diagnosis: Major Depressive Disorder, Recurrent, Severe, without psychotic features, Anxiety Disorder, and Alcohol Use Disorder   CCA Screening, Triage and Referral (STR)  Patient Reported Information How did you hear about us? Self  Referral name: Self Referral  Referral phone number: [phone])   Whom do you see for routine medical problems? I don't have a doctor  Practice/Facility Name: No data recorded Practice/Facility Phone Number: No data recorded Name of Contact: No data recorded Contact Number: No data recorded Contact Fax Number: No data recorded Prescriber Name: No data recorded Prescriber Address (if known): No data recorded  What Is the Reason for Your Visit/Call Today? Patient states that he is suicidal and he is in alcohol withdrawal, requesting detox.  How Long Has This Been Causing You Problems? > than 6 months  What Do You Feel Would Help You the Most Today? Alcohol or Drug Use Treatment; Treatment for Depression or other mood problem; Stress Management; Medication(s); Housing Assistance   Have You Recently Been in Any Inpatient Treatment (Hospital/Detox/Crisis Center/28-Day Program)? No  Name/Location of Program/Hospital:No data recorded How Long Were You There? No data recorded When Were You Discharged? No data recorded  Have You Ever Received Services From [facility] Before? Yes  Who Do You See at [facility]? has [facility] ED and [facility]   Have You Recently Had Any Thoughts About Hurting Yourself?  Yes  Are You Planning to Commit Suicide/Harm Yourself At This time? Yes   Have you Recently Had Thoughts About Hurting Someone Carlos Ohslse? No  Explanation: No data recorded  Have You Used Any Alcohol or Drugs in the Past 24 Hours? No  How Long Ago Did You Use Drugs or Alcohol? 0000 (Last night, exact time unknown)  What Did You Use and How Much? used alcohol 2 days ago   Do You Currently Have a Therapist/Psychiatrist? No  Name of Therapist/Psychiatrist: No data recorded  Have You Been Recently Discharged From Any Office Practice or Programs? No  Explanation of Discharge From Practice/Program: No data recorded    CCA Screening Triage Referral Assessment Type of Contact: Tele-Assessment  Is this Initial or Reassessment? Initial Assessment  Date Telepsych consult ordered in [facility]:  03/21/2021  [facility]  0656   Patient Reported Information Reviewed? Yes  Patient Left Without Being Seen? No data recorded Reason for Not Completing Assessment: No data recorded  Collateral Involvement: no collateral information available.  Patient is homeless and has minimal support   Does Patient Have a Automotive engineerCourt Appointed Legal Guardian? No data recorded Name and Contact of Legal Guardian: No data recorded If Minor and Not Living with Parent(s), Who has Custody? No data recorded Is CPS involved or ever been involved? Never  Is APS involved or ever been involved? Never   Patient Determined To Be At Risk for Harm To Self or Others Based on Review  of Patient Reported Information or Presenting Complaint? No  Method: No data recorded Availability of Means: No data recorded Intent: No data recorded Notification Required: No data recorded Additional Information for Danger to Others Potential: No data recorded Additional Comments for Danger to Others Potential: No data recorded Are There Guns or Other Weapons in Your Home? No data recorded Types of Guns/Weapons: No data  recorded Are These Weapons Safely Secured?                            No data recorded Who Could Verify You Are Able To Have These Secured: No data recorded Do You Have any Outstanding Charges, Pending Court Dates, Parole/Probation? No data recorded Contacted To Inform of Risk of Harm To Self or Others: Other: Comment (no one available to notify)   Location of Assessment: [facility]   Does Patient Present under Involuntary Commitment? No  IVC Papers Initial File Date: No data recorded  [place] of Residence: [place]   Patient Currently Receiving the Following Services: -- (no outptient supports at this time.)   Determination of Need: Emergent (2 hours)   Options For Referral: Inpatient Hospitalization; Medication Management     CCA Biopsychosocial Intake/Chief Complaint:  Patient presented to [facility] as a walk in (voluntarily).  He had presented with suicidal ideations and the need for alcohol detox.  Patient has been drinking daily, on average 12 beers and he last drank two days ago and states that he was in withdrawal.   Patient with suicidal thoughts an plan to cut his wrist.   Patient states that he feels like he needs help with his mental health symptos and then hopes to go to inpatient treatment for his substance use symptoms.  Current Symptoms/Problems: Patient states that he is depressed and increasingly anxious   Patient Reported Schizophrenia/Schizoaffective Diagnosis in Past: No   Strengths: Patient was not able to identify any individal strengths that he possesses  Preferences: Patient has no special preferences or requests that require special accommodation  Abilities: Patient was not able to identify any special abilities or talents   Type of Services Patient Feels are Needed: Patient is requesting inpatient treatment   Initial Clinical Notes/Concerns: NA   Mental Health Symptoms Depression:  Change in energy/activity; Difficulty Concentrating;  Fatigue; Hopelessness; Increase/decrease in appetite; Irritability; Sleep (too much or little); Tearfulness; Weight gain/loss; Worthlessness; Duration of symptoms greater than two weeks   Duration of Depressive symptoms: No data recorded  Mania:  None   Anxiety:   Difficulty concentrating; Irritability; Restlessness; Worrying; Tension   Psychosis:  None   Duration of Psychotic symptoms: No data recorded  Trauma:  None   Obsessions:  None   Compulsions:  "Driven" to perform behaviors/acts   Inattention:  None   Hyperactivity/Impulsivity:  N/A   Oppositional/Defiant Behaviors:  None   Emotional Irregularity:  None   Other Mood/Personality Symptoms:  No data recorded   Mental Status Exam Appearance and self-care  Stature:  Average   Weight:  Average weight   Clothing:  Disheveled   Grooming:  Neglected   Cosmetic use:  None   Posture/gait:  Slumped   Motor activity:  Not Remarkable   Sensorium  Attention:  Normal   Concentration:  Normal   Orientation:  Object; Person; Place; Situation; Time   Recall/memory:  Normal   Affect and Mood  Affect:  Anxious   Mood:  Anxious   Relating  Eye  contact:  Normal   Facial expression:  Anxious   Attitude toward examiner:  Cooperative   Thought and Language  Speech flow: Clear and Coherent   Thought content:  Appropriate to Mood and Circumstances   Preoccupation:  None   Hallucinations:  None   Organization:  No data recorded  [employer] of Knowledge:  Fair   Intelligence:  Average   Abstraction:  Normal   Judgement:  Impaired   Reality Testing:  Distorted   Insight:  Gaps   Decision Making:  Impulsive   Social Functioning  Social Maturity:  Irresponsible   Social Judgement:  Heedless   Stress  Stressors:  Housing; Office manager Ability:  Contractor Deficits:  None   Supports:  Support needed     Religion: Religion/Spirituality Are You A Religious  Person?: No  Leisure/Recreation: Leisure / Recreation Do You Have Hobbies?: No  Exercise/Diet: Exercise/Diet Do You Exercise?: No Have You Gained or Lost A Significant Amount of Weight in the Past Six Months?: No Do You Follow a Special Diet?: No Do You Have Any Trouble Sleeping?: Yes Explanation of Sleeping Difficulties: reports little to no sleep   CCA Employment/Education Employment/Work Situation: Employment / Work Situation Employment situation: Unemployed Patient's job has been impacted by current illness: No 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 Where was the patient employed at that time?: [employer] Has patient ever been in the [employer]?: No  Education: Education Is Patient Currently Attending School?: No Last Grade Completed: 12 Did Garment/textile technologist From [employer]?: Yes Did Theme park manager?: No Did Designer, television/film set?: No Did You Have An Individualized Education Program (IIEP): No Did You Have Any Difficulty At [employer]?: No Patient's Education Has Been Impacted by Current Illness: No   CCA Family/Childhood History Family and Relationship History: Family history Marital status: Single Are you sexually active?: Yes What is your sexual orientation?: heterosexual Has your sexual activity been affected by drugs, alcohol, medication, or emotional stress?: no Does patient have children?: No  Childhood History:  Childhood History By whom was/is the patient raised?: Mother Additional childhood history information: none provided Description of patient's relationship with caregiver when they were a child: "fine"; father alcohol How were you disciplined when you got in trouble as a child/adolescent?: NA Does patient have siblings?: Yes Number of Siblings:  (1 sister) Description of patient's current relationship with siblings: unknown Did patient suffer any verbal/emotional/physical/sexual abuse as a child?: No Did patient suffer from severe  childhood neglect?: No Has patient ever been sexually abused/assaulted/raped as an adolescent or adult?: No Was the patient ever a victim of a crime or a disaster?: No Witnessed domestic violence?: No Has patient been affected by domestic violence as an adult?: No  Child/Adolescent Assessment:     CCA Substance Use Alcohol/Drug Use: Alcohol / Drug Use Pain Medications: see MAR Prescriptions: see MAR Over the Counter: see MAR History of alcohol / drug use?: Yes Negative Consequences of Use: Personal relationships,Work / School,Financial Withdrawal Symptoms: Change in blood pressure,DTs                         ASAM's:  Six Dimensions of Multidimensional Assessment  Dimension 1:  Acute Intoxication and/or Withdrawal Potential:   Dimension 1:  Description of individual's past and current experiences of substance use and withdrawal: Patient states that he has a history of DTs when he is withdrawing  from alcohol  Dimension 2:  Biomedical Conditions and Complications:   Dimension 2:  Description of patient's biomedical conditions and  complications: when patient is withdrawing from alcohol, he states that he experiences elevated blood pressure and heart rate  Dimension 3:  Emotional, Behavioral, or Cognitive Conditions and Complications:  Dimension 3:  Description of emotional, behavioral, or cognitive conditions and complications: Patient states that he is depressed and he drinks to self-medicate his depression  Dimension 4:  Readiness to Change:  Dimension 4:  Description of Readiness to Change criteria: Patient states that he is ready to take the steps necessary to stop drinking and states that he plans to pursue treatment  Dimension 5:  Relapse, Continued use, or Continued Problem Potential:  Dimension 5:  Relapse, continued use, or continued problem potential critiera description: Patient has minimal success in recovery and is a chronic relapser  Dimension 6:  Recovery/Living  Environment:  Dimension 6:  Recovery/Iiving environment criteria description: Patient is homeless and has minimal support  ASAM Severity Score: ASAM's Severity Rating Score: 11  ASAM Recommended Level of Treatment:     Substance use Disorder (SUD) Substance Use Disorder (SUD)  Checklist Symptoms of Substance Use: Continued use despite having a persistent/recurrent physical/psychological problem caused/exacerbated by use,Continued use despite persistent or recurrent social, interpersonal problems, caused or exacerbated by use,Evidence of tolerance,Evidence of withdrawal (Comment),Persistent desire or unsuccessful efforts to cut down or control use,Presence of craving or strong urge to use,Recurrent use that results in a failure to fulfill major role obligations (work, school, home),Social, occupational, recreational activities given up or reduced due to use,Substance(s) often taken in larger amounts or over longer times than was intended  Recommendations for Services/Supports/Treatments: Recommendations for Services/Supports/Treatments Recommendations For Services/Supports/Treatments: Residential-Level 3  DSM5 Diagnoses: Patient Active Problem List   Diagnosis Date Noted  . Suicidal ideation 03/21/2021  . Alcohol-induced mood disorder [facility])     Patient Centered Plan: Patient is on the following Treatment Plan(s):  Anxiety, Depression and Substance Abuse   Referrals to Alternative Service(s): Referred to Alternative Service(s):   Place:   Date:   Time:    Referred to Alternative Service(s):   Place:   Date:   Time:    Referred to Alternative Service(s):   Place:   Date:   Time:    Referred to Alternative Service(s):   Place:   Date:   Time:     Melynda Ripple, Counselor

## 2021-03-21 NOTE — ED Triage Notes (Signed)
The patient presents with suicidal ideations, anxiety and what he believes to be alcohol withdrawal. He reports having suicidal thoughts and anxiety for a couple months now. His last drink was 2 days ago and he has noticed panic attacks, chest pain and sweating.

## 2021-03-21 NOTE — ED Notes (Signed)
Per caroline, NP-[facility] case of [facility] day-history of seizures when detoxing-states lungs sounds are "junky"-sending to ED for clearance

## 2021-03-21 NOTE — BH Assessment (Signed)
[facility] Assessment Progress Note  Per Vernard Gambles, NP, this voluntary pt requires psychiatric hospitalization.  Per [employer], RN, [facility], no beds are available at [facility] at this time.  Per Duard Brady, LMFT, no beds are available at [facility] at this time.  Gray Bernhardt has been notified.  For the time being, pt will remain at [facility] pending bed availability.  EDP Arby Barrette, MD and pt's nurse, Lamar Laundry, have been notified.  Doylene Canning, [place] [facility] Coordinator [phone]

## 2021-03-21 NOTE — BH Assessment (Addendum)
MSE completed by provider Vernard Gambles, RN). TTS assessment completed by this Clinician.    Disposition: Sanjuan Dame to be transported to [facility] via [employer]. He initially presented as a walk in to [facility] for depression, suicidal ideations w/ plan to cut wrist, anxiety, depression, and alcohol use. He reports some withdrawal symptoms currently: sweats and anxiety. He is not appropriate for [facility] and will be sent to [facility] due to hx of Alcohol induced seizures. Patient to be medically cleared at [facility]. In the event that patient medically clears, Disposition Counselor Doylene Canning) requested to seek appropriate placement for this patient. Per [facility] AC ([facility]), [facility] has no beds available at this time. Charge nurse Kennyth Arnold, RN) and EDP (Dr. Audley Hose) made aware of the recommended plan of care. [facility] called and will transport patient to [facility] sitter will accompany patient).

## 2021-03-21 NOTE — H&P (Signed)
Behavioral Health Medical Screening Exam  Carlos Flowers is a 40 y.o. male patient presented to [facility] as a walk in  accompanied by himself with complaints of "I want to kill my self".   Carlos Flowers, 40 y.o., male patient seen face to face by this provider, consulted with Dr. Baldwin Jamaica; and chart reviewed on 03/21/21.  On evaluation Carlos Flowers reports over the past few weeks his depression has worsened. States his appetite and sleep are  decreased. States he feels worthless and hopeless. Reports that the past few days he has thoughts of wanting to die. Reports he has a plan with means. States, "I just want to cut my wrist and die".   During evaluation Carlos Flowers is in sitting in no acute distress.  He is alert, oriented x 4, anxious and  cooperative. His mood is depressed with congruent affect. He has normal speech, and behavior.  Objectively there is no evidence of psychosis/mania or delusional thinking.  Patient is able to converse coherently, goal directed thoughts, no distractibility, or pre-occupation. He denies homicidal ideations,  paranoia and delusions. Endorses Suicidal ideations with a plan to cut his wrist and "end it" if he is discharged home.  Patient reports he drinks 1 case of beer daily, but states he has not had the financial means the last two days to buy alcohol. 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 Has history of inpatient psychiatric treatment. Three prior suicide attempts by overdosing on pills. No out patient behavorial health services in place.      Reports he lives with a friend currently.   Total Time spent with patient: 30 minutes  Psychiatric Specialty Exam:  Presentation  General Appearance: Disheveled  Eye Contact:Minimal  Speech:Clear and Coherent  Speech Volume:Normal  Handedness:Right   Mood and Affect  Mood:Depressed; Anxious; Hopeless;  Worthless  Affect:Congruent; Depressed   Art gallery manager Processes:Coherent  Descriptions of Associations:Intact  Orientation:Full (Time, Place and Person)  Thought Content:Logical  History of Schizophrenia/Schizoaffective disorder:No data recorded Duration of Psychotic Symptoms:No data recorded Hallucinations:Hallucinations: None  Ideas of Reference:None  Suicidal Thoughts:Suicidal Thoughts: Yes, Active SI Active Intent and/or Plan: With Access to Means; With Means to Carry Out; With Intent  Homicidal Thoughts:Homicidal Thoughts: No   Sensorium  Memory:Immediate Good; Recent Good; Remote Good  Judgment:Poor  Insight:Fair   Executive Functions  Concentration:Good  Attention Span:Good  Recall:Good  Fund of Knowledge:Good  Language:Good   Psychomotor Activity  Psychomotor Activity:Psychomotor Activity: Restlessness   Assets  Assets:Communication Skills; Desire for Improvement; Physical Health; Resilience; Social Support   Sleep  Sleep:Sleep: Poor    Physical Exam: Physical Exam Constitutional:      Appearance: He is normal weight.  HENT:     Head: Normocephalic.     Right Ear: Tympanic membrane normal.     Left Ear: Tympanic membrane normal.     Nose: Nose normal.     Mouth/Throat:     Pharynx: Oropharynx is clear.  Eyes:     Conjunctiva/sclera: Conjunctivae normal.  Cardiovascular:     Rate and Rhythm: Normal rate.     Pulses: Normal pulses.  Pulmonary:     Breath sounds: Wheezing and rhonchi present.  Abdominal:     Tenderness: There is no guarding.  Musculoskeletal:        General: Normal range of motion.     Cervical back: Normal range of motion.  Skin:  General: Skin is warm.  Neurological:     Mental Status: He is alert and oriented to person, place, and time.  Psychiatric:        Attention and Perception: Attention normal.        Mood and Affect: Mood is anxious and depressed.        Speech: Speech normal.         Behavior: Behavior is cooperative.        Thought Content: Thought content is not paranoid or delusional. Thought content includes suicidal ideation. Thought content does not include homicidal ideation. Thought content includes suicidal plan. Thought content does not include homicidal plan.        Cognition and Memory: Cognition normal.        Judgment: Judgment is impulsive.    Review of Systems  Constitutional: Negative.   HENT: Negative.   Eyes: Negative.   Respiratory: Positive for wheezing.   Cardiovascular: Negative.   Gastrointestinal: Negative.   Genitourinary: Negative.   Musculoskeletal: Negative.   Skin: Negative.   Neurological: Negative.   Endo/Heme/Allergies: Negative.   Psychiatric/Behavioral: Negative.    Blood pressure (!) 138/99, pulse (!) 104, temperature 98 F (36.7 C), temperature source Oral, resp. rate 18. There is no height or weight on file to calculate BMI.  Musculoskeletal: Strength & Muscle Tone: within normal limits Gait & Station: normal Patient leans: N/A   Recommendations:  Based on my evaluation the patient does not appear to have an emergency medical condition at this time but needs medical clearance due to his history of alcohol withdrawal induced seizures.    Patient is being sent to [facility]. Has history of seizure from alcohol withdrawal. Has history of seizure in the last 5 months. States he has cold, lung sounds are wheezy with rhonchi. Patient will be transported by safe transport.   Once medically cleared patient meets criteria for inpatient psychiatric treatment.   Dr. Audley Hose EDP and Charge Nurse Doroteo Glassman notifed.  Patient sent by safe transport.   Ardis Hughs, NP 03/21/2021, 10:34 AM

## 2021-03-21 NOTE — BH Assessment (Addendum)
Disposition:   Per Vernard Gambles, NP, this voluntary pt requires psychiatric hospitalization.  Per [employer], RN, [facility], @ [phone] it was noted no beds were available.   Per Duard Brady, LMFT, @ [phone] it was noted no beds were available at [facility].  Followed up with  [facility] ([place], AC) at 1956 via secure chat to request updates regarding bed capacity changes at [facility].   For the time being, pt will remain at [facility] pending bed availability.

## 2021-03-21 NOTE — ED Provider Notes (Signed)
[facility]-EMERGENCY DEPT Provider Note   CSN: [id number] Arrival date & time: 03/21/21  1109     History Chief Complaint  Patient presents with  . Withdrawal  . Anxiety  . Suicidal    Carlos Flowers is a 41 y.o. male who presents with concern for suicidal ideations, severe anxiety, and concern for possible alcohol withdrawal.  Patient was recently seen on 4/17 for very similar symptoms.  At that time he is living in a sober halfway house, which she states he has moved out of at this time due to worsening anxiety by residing alone environment.  He states that he has had alcohol since he left that place, most recently 48 hours ago.  Feels he is now tremulous, with increasing anxiety, episodes of racing heart rate, and increased in suicidal ideation with plan of either slitting his wrists or "drinking myself to death".  Additionally he endorses intermittent panic attacks at which time he experiences central chest pain and diaphoresis.  He states he was seen at behavioral health urgent care this afternoon but was sent to the emergency department due to "his lungs are sounding junky"".  I personally reviewed the patient's ,medical records.  He does have history of seizures when he has withdrawn in the past.   States that he usually drinks 1 whole case of beer per day and smokes a whole pack of cigarettes per day.  He is on Paxil but states that this makes his depression worse.  Denies any AVH, denies any other illicit drug use.  HPI     Past Medical History:  Diagnosis Date  . Alcoholism (HCC)   . Anxiety   . Depression   . Substance abuse [facility])     Patient Active Problem List   Diagnosis Date Noted  . Suicidal ideation 03/21/2021  . Alcohol-induced mood disorder (HCC)     History reviewed. No pertinent surgical history.     Family History  Problem Relation Age of Onset  . Cancer Maternal Grandmother   . Heart disease Paternal Grandmother   . Heart  disease Paternal Grandfather     Social History   Tobacco Use  . Smoking status: Current Every Day Smoker    Packs/day: 1.00    Years: 17.00    Pack years: 17.00    Types: Cigarettes  . Smokeless tobacco: Never Used  Vaping Use  . Vaping Use: Never used  Substance Use Topics  . Alcohol use: Yes    Alcohol/week: 10.0 - 12.0 standard drinks    Types: 10 - 12 Cans of beer per week  . Drug use: Not Currently    Types: Cocaine, Heroin    Comment: last use 12/17/2018    Home Medications Prior to Admission medications   Medication Sig Start Date End Date Taking? Authorizing Provider  chlordiazePOXIDE (LIBRIUM) 25 MG capsule 50mg  PO TID x 1D, then 25-50mg  PO BID X 1D, then 25-50mg  PO QD X 1D 03/16/21   03/18/21, PA-C  cloNIDine (CATAPRES) 0.1 MG tablet Take 1 tablet (0.1 mg total) by mouth at bedtime. 08/09/20   10/09/20, NP  gabapentin (NEURONTIN) 400 MG capsule Take 1 capsule (400 mg total) by mouth 3 (three) times daily. 08/09/20   10/09/20, NP  hydrOXYzine (ATARAX/VISTARIL) 25 MG tablet Take 1 tablet (25 mg total) by mouth every 6 (six) hours as needed for anxiety. 03/18/21   Long, 03/20/21, MD  nicotine polacrilex (NICORETTE) 2 MG gum  Take 1 each (2 mg total) by mouth as needed for smoking cessation. 08/09/20   Aldean Baker, NP  PARoxetine (PAXIL) 20 MG tablet Take 1 tablet (20 mg total) by mouth daily. 08/10/20   Aldean Baker, NP    Allergies    Peanut-containing drug products  Review of Systems   Review of Systems  Constitutional: Negative.   HENT: Negative.   Respiratory: Negative.   Cardiovascular: Positive for palpitations. Negative for chest pain and leg swelling.  Gastrointestinal: Negative.   Genitourinary: Negative.   Musculoskeletal: Negative.   Skin: Negative.   Neurological: Positive for tremors. Negative for dizziness, syncope, facial asymmetry, weakness, light-headedness and headaches.  Psychiatric/Behavioral: Positive for sleep disturbance  and suicidal ideas. The patient is nervous/anxious.     Physical Exam Updated Vital Signs BP 129/82   Pulse 98   Temp 98.2 F (36.8 C) (Oral)   Resp 13   Ht 6' (1.829 m)   Wt 97.5 kg   SpO2 95%   BMI 29.16 kg/m   Physical Exam Vitals and nursing note reviewed.  Constitutional:      Appearance: He is overweight. He is not toxic-appearing.  HENT:     Head: Normocephalic and atraumatic.     Nose: Nose normal.     Mouth/Throat:     Mouth: Mucous membranes are moist.     Pharynx: Oropharynx is clear. Uvula midline. No oropharyngeal exudate, posterior oropharyngeal erythema or uvula swelling.     Tonsils: No tonsillar exudate.  Eyes:     General: Lids are normal. Vision grossly intact.        Right eye: No discharge.        Left eye: No discharge.     Extraocular Movements: Extraocular movements intact.     Conjunctiva/sclera: Conjunctivae normal.     Pupils: Pupils are equal, round, and reactive to light.  Neck:     Trachea: Trachea and phonation normal.  Cardiovascular:     Rate and Rhythm: Normal rate and regular rhythm.     Pulses: Normal pulses.     Heart sounds: Normal heart sounds. No murmur heard.   Pulmonary:     Effort: Pulmonary effort is normal. No respiratory distress.     Breath sounds: Examination of the right-lower field reveals wheezing. Examination of the left-lower field reveals wheezing. Wheezing present. No rales.  Chest:     Chest wall: No swelling, tenderness, crepitus or edema.  Abdominal:     General: Bowel sounds are normal. There is no distension.     Palpations: Abdomen is soft.     Tenderness: There is no abdominal tenderness.  Musculoskeletal:        General: No deformity.     Cervical back: Neck supple. No crepitus. No pain with movement, spinous process tenderness or muscular tenderness.     Right lower leg: No edema.     Left lower leg: No edema.  Lymphadenopathy:     Cervical: No cervical adenopathy.  Skin:    General: Skin is  warm and dry.  Neurological:     General: No focal deficit present.     Mental Status: He is alert and oriented to person, place, and time. Mental status is at baseline.     Cranial Nerves: Cranial nerves are intact.     Sensory: Sensation is intact.     Motor: Motor function is intact.     Gait: Gait is intact.  Psychiatric:  Mood and Affect: Mood is anxious.     Comments: Patient is not responding to internal stimuli, denies hallucinations. He is fidgety, anxious, tremulous in the hands.      ED Results / Procedures / Treatments   Labs (all labs ordered are listed, but only abnormal results are displayed) Labs Reviewed  COMPREHENSIVE METABOLIC PANEL - Abnormal; Notable for the following components:      Result Value   CO2 21 (*)    Glucose, Bld 101 (*)    Total Protein 9.2 (*)    Albumin 5.1 (*)    All other components within normal limits  SALICYLATE LEVEL - Abnormal; Notable for the following components:   Salicylate Lvl <7.0 (*)    All other components within normal limits  ACETAMINOPHEN LEVEL - Abnormal; Notable for the following components:   Acetaminophen (Tylenol), Serum <10 (*)    All other components within normal limits  RESP PANEL BY RT-PCR (FLU A&B, COVID) ARPGX2  ETHANOL  CBC  RAPID URINE DRUG SCREEN, HOSP PERFORMED    EKG EKG Interpretation  Date/Time:  Wednesday March 21 2021 12:17:24 EDT Ventricular Rate:  93 PR Interval:  133 QRS Duration: 97 QT Interval:  359 QTC Calculation: 447 R Axis:   83 Text Interpretation: Sinus rhythm Probable left atrial enlargement 12 Lead; Mason-Likar Confirmed by Norman Clay ([id number]) on 03/21/2021 3:07:19 PM   Radiology DG Chest Port 1 View  Result Date: 03/21/2021 CLINICAL DATA:  Chest pain. EXAM: PORTABLE CHEST 1 VIEW COMPARISON:  March 20, 2021. FINDINGS: The heart size and mediastinal contours are within normal limits. Both lungs are clear. No pneumothorax or pleural effusion is noted. The visualized  skeletal structures are unremarkable. IMPRESSION: No active disease. Electronically Signed   By: Lupita Raider M.D.   On: 03/21/2021 12:59    Procedures Procedures   Medications Ordered in ED Medications  thiamine tablet 100 mg (100 mg Oral Given 03/21/21 1240)    Or  thiamine (B-1) injection 100 mg ( Intravenous See Alternative 03/21/21 1240)  nicotine (NICODERM CQ - dosed in mg/24 hours) patch 21 mg (21 mg Transdermal Patch Applied 03/21/21 1240)  LORazepam (ATIVAN) injection 0-4 mg (has no administration in time range)    Or  LORazepam (ATIVAN) tablet 0-4 mg (has no administration in time range)  LORazepam (ATIVAN) injection 0-4 mg (has no administration in time range)    Or  LORazepam (ATIVAN) tablet 0-4 mg (has no administration in time range)  LORazepam (ATIVAN) tablet 1 mg (1 mg Oral Given 03/21/21 1240)    ED Course  I have reviewed the triage vital signs and the nursing notes.  Pertinent labs & imaging results that were available during my care of the patient were reviewed by me and considered in my medical decision making (see chart for details).    MDM Rules/Calculators/A&P                         40 year old male presents with concern for suicidal ideation and alcohol withdrawal.  Tachycardic and hypertensive on intake, vital signs otherwise normal.  Cardiopulmonary exam is significant only for wheezing in the lung bases, abdominal exam is benign.  No focal deficit on neurologic exam.  We will proceed with medical clearance work-up and place patient on CIWA protocol.  Patient is voluntary at this time.  CBC unremarkable, CMP-up is overall unremarkable, there is no transaminitis or elevation in bilirubin.  EKG with normal sinus rhythm, no  STEMI.  Chest x-ray reassuring.  Patient is medically cleared at this time.  Ready for TTS consult.  Per charge nurse and psych counselor will attempt to transport patient to psychiatric facility for further evaluation and treatment.  He  remains under CIWA protocol at this time.  Carlos Flowers voiced understanding of his medical evaluation and treatment plan.  He is agreeable to plan for transport to psychiatric facility for further treatment.  Each of his questions was answered to his expressed satisfaction.  He has not required any further Ativan per CIWA protocol aside from the initial dose ordered by this provider. His vital signs have remained normal throughout his stay in the ED.   This chart was dictated using voice recognition software, Dragon. Despite the best efforts of this provider to proofread and correct errors, errors may still occur which can change documentation meaning.  Final Clinical Impression(s) / ED Diagnoses Final diagnoses:  Wheezing    Rx / DC Orders ED Discharge Orders    None       Sherrilee GillesSponseller, Lamira Borin R, PA-C 03/21/21 1522    Cheryll CockayneHong, Joshua S, MD 03/23/21 [phone]

## 2021-03-21 NOTE — ED Notes (Signed)
4 bags of patient belongings placed behind the triage nursing desk.

## 2021-03-22 ENCOUNTER — Inpatient Hospital Stay (HOSPITAL_COMMUNITY)
Admission: RE | Admit: 2021-03-22 | Discharge: 2021-03-27 | DRG: 885 | Disposition: A | Discharge status: Home / Self Care | Payer: Federal, State, Local not specified - Other | Source: Intra-hospital | Attending: Behavioral Health | Admitting: Behavioral Health

## 2021-03-22 ENCOUNTER — Encounter (HOSPITAL_COMMUNITY): Payer: Self-pay | Admitting: Psychiatry

## 2021-03-22 ENCOUNTER — Other Ambulatory Visit: Payer: Self-pay

## 2021-03-22 DIAGNOSIS — Z818 Family history of other mental and behavioral disorders: Secondary | ICD-10-CM

## 2021-03-22 DIAGNOSIS — F419 Anxiety disorder, unspecified: Secondary | ICD-10-CM | POA: Diagnosis present

## 2021-03-22 DIAGNOSIS — Z5900 Homelessness unspecified: Secondary | ICD-10-CM | POA: Diagnosis not present

## 2021-03-22 DIAGNOSIS — F17210 Nicotine dependence, cigarettes, uncomplicated: Secondary | ICD-10-CM | POA: Diagnosis present

## 2021-03-22 DIAGNOSIS — F332 Major depressive disorder, recurrent severe without psychotic features: Principal | ICD-10-CM | POA: Diagnosis present

## 2021-03-22 DIAGNOSIS — F10239 Alcohol dependence with withdrawal, unspecified: Secondary | ICD-10-CM | POA: Diagnosis present

## 2021-03-22 DIAGNOSIS — Z79899 Other long term (current) drug therapy: Secondary | ICD-10-CM | POA: Diagnosis not present

## 2021-03-22 DIAGNOSIS — Z20822 Contact with and (suspected) exposure to covid-19: Secondary | ICD-10-CM | POA: Diagnosis present

## 2021-03-22 DIAGNOSIS — R45851 Suicidal ideations: Secondary | ICD-10-CM | POA: Diagnosis present

## 2021-03-22 MED ORDER — HYDROXYZINE HCL 25 MG PO TABS
25.0000 mg | ORAL_TABLET | Freq: Three times a day (TID) | ORAL | Status: DC | PRN
  Administered 2021-03-22 – 2021-03-27 (×9): 25 mg via ORAL
  Filled 2021-03-22 (×2): qty 1
  Filled 2021-03-22: qty 21
  Filled 2021-03-22 (×5): qty 1
  Filled 2021-03-22: qty 10
  Filled 2021-03-22 (×2): qty 1

## 2021-03-22 MED ORDER — CHLORDIAZEPOXIDE HCL 25 MG PO CAPS
50.0000 mg | ORAL_CAPSULE | Freq: Once | ORAL | Status: AC
  Administered 2021-03-22: 50 mg via ORAL
  Filled 2021-03-22: qty 2

## 2021-03-22 MED ORDER — MAGNESIUM HYDROXIDE 400 MG/5ML PO SUSP: 30.0000 mL | Freq: Every day | ORAL | Status: DC | PRN

## 2021-03-22 MED ORDER — ALUM & MAG HYDROXIDE-SIMETH 200-200-20 MG/5ML PO SUSP: 30.0000 mL | ORAL | Status: DC | PRN

## 2021-03-22 MED ORDER — CHLORDIAZEPOXIDE HCL 25 MG PO CAPS
25.0000 mg | ORAL_CAPSULE | Freq: Four times a day (QID) | ORAL | Status: DC | PRN
  Administered 2021-03-22 – 2021-03-23 (×2): 25 mg via ORAL
  Filled 2021-03-22 (×2): qty 1

## 2021-03-22 MED ORDER — TRAZODONE HCL 50 MG PO TABS
50.0000 mg | ORAL_TABLET | Freq: Every evening | ORAL | Status: DC | PRN
  Administered 2021-03-22 – 2021-03-26 (×5): 50 mg via ORAL
  Filled 2021-03-22 (×4): qty 1
  Filled 2021-03-22: qty 7
  Filled 2021-03-22: qty 1

## 2021-03-22 MED ORDER — NICOTINE POLACRILEX 2 MG MT GUM
2.0000 mg | CHEWING_GUM | OROMUCOSAL | Status: DC | PRN
  Administered 2021-03-22 – 2021-03-27 (×20): 2 mg via ORAL
  Filled 2021-03-22: qty 1
  Filled 2021-03-22: qty 7
  Filled 2021-03-22 (×4): qty 1
  Filled 2021-03-22: qty 10
  Filled 2021-03-22 (×4): qty 1

## 2021-03-22 MED ORDER — CHLORDIAZEPOXIDE HCL 25 MG PO CAPS: 25.0000 mg | ORAL_CAPSULE | Freq: Four times a day (QID) | ORAL | Status: DC | PRN

## 2021-03-22 MED ORDER — ACETAMINOPHEN 325 MG PO TABS
650.0000 mg | ORAL_TABLET | Freq: Four times a day (QID) | ORAL | Status: DC | PRN
  Administered 2021-03-24 (×2): 650 mg via ORAL
  Filled 2021-03-22 (×2): qty 2

## 2021-03-22 NOTE — BH Assessment (Signed)
[facility] Assessment Progress Note  Per Vernard Gambles, NP, this pt requires psychiatric hospitalization at this time.  Malva Limes, RN, [facility] has assigned pt to [facility] Rm 305-1 to the service of Dr Fayrene Fearing; [facility] is now ready to receive pt.  Pt has signed Voluntary Admission and Consent for Treatment, as well as Consent to Release Information to his mother and his girlfriend, and signed forms have been faxed to [facility].  EDP Lorre Nick and pt's nurse, Silvio Pate, have been notified, and Silvio Pate agrees to send original paperwork along with pt via [facility], and to call report to [phone].  Doylene Canning, [place] Behavioral [facility] Coordinator [phone]

## 2021-03-22 NOTE — Progress Notes (Signed)
Patient ID: Niki Cosman, male   DOB: 1981-09-07, 40 y.o.   MRN: [id number] Admission Note  Pt is a 40 yo male that presents voluntarily on 03/22/21 with worsening anxiety, depression, financial strain, homelessness, and alcohol use/abuse that led the pt to contemplate suicidal ideations. Pt states they have multiple plans, the main being drinking themselves to death. Pt states they have been drinking 15-18 beers/day. Pt presents with withdrawal symptoms including sweating, anxiety, agitation, tremors, and nausea. Pt is still having passive si with no plan. Pt contracts for safety. Pt is a 2 ppd smoker. Pt denies other drug/substance use. Pt denies current/past verbal/physical/sexual abuse. Pt is requesting in patient rehab upon discharge.  Pt is pleasant. Q58m safety checks implemented and continued. Consents signed, handbook detailing the patient's rights, responsibilities, and visitor guidelines provided. Skin/belongings search completed and patient oriented to unit. Patient stable at this time. Patient given the opportunity to express concerns and ask questions. Patient given toiletries. Will continue to monitor.   [facility] Assessment 03/21/2021:  Hendry Speas is a 40 year male that presents to [facility] as a walk-in. He is a self referral and voluntary. His complaint today is suicidal ideations, depression, anxiety, and anxiety. Patient with worsening depressive symptoms starting several months ago. Current depressive symptoms include irritability/anger, guilt, worthlessness, tearful, wanting to be alone, and unable to get out of the bed. He is suicidal with a plan to cut his wrist. He not only has a plan but has intent. Unable to contract for safety.  Patient has access to means: sharp objects and medications. Patient with a history of 3 suicide attempts by overdose. His last suicide attempt was over a year ago. Previous suicide attempts and current suicidal ideations are triggered by "stress in life",  addiction, and anxiety. He has a family history of mental health illness stating that his mother was dx's with anxiety and depression. He has no history of abuse-sexual, emotional, verbal.  Patient's support system is his girlfriend. His relationship with his parents are estranged and he has #1 sister. Patient currently is living "place to place".  Highest level of education is some college. 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   Patient denies HI and AVH's. Patient with history of alcohol use. His age of first use is 40 yrs old . He reports daily use for the past 3 years drinking 12 beers or 1 case of beer daily. Lat use was 2 days ago and he consumed 1 case of beer.  He has participated in outpatient residential treatment at [facility] and [facility] (5-6 years ago). Patient does not have an outpatient therapist and/or psychiatrist. However, has previously received outpatient psychiatric services at [facility] in [facility]. He was  prescribed Gabapentin in the past. He has not taken this prescribed medication in 5-6 years.

## 2021-03-22 NOTE — ED Notes (Signed)
[facility] notified of discharge to [facility].

## 2021-03-22 NOTE — ED Provider Notes (Signed)
Emergency Medicine Observation Re-evaluation Note  Joshau Code is a 40 y.o. male, seen on rounds today.  Pt initially presented to the ED for complaints of Withdrawal, Anxiety, and Suicidal Currently, the patient is resting comfortably.  Physical Exam  BP (!) 116/98 (BP Location: Right Arm)   Pulse 100   Temp 97.9 F (36.6 C) (Oral)   Resp 18   Ht 1.829 m (6')   Wt 97.5 kg   SpO2 96%   BMI 29.16 kg/m  Physical Exam General: Calm and cooperative   ED Course / MDM  EKG:EKG Interpretation  Date/Time:  Wednesday March 21 2021 12:17:24 EDT Ventricular Rate:  93 PR Interval:  133 QRS Duration: 97 QT Interval:  359 QTC Calculation: 447 R Axis:   83 Text Interpretation: Sinus rhythm Probable left atrial enlargement 12 Lead; Mason-Likar Confirmed by Norman Clay ([id number]) on 03/21/2021 3:07:19 PM   I have reviewed the labs performed to date as well as medications administered while in observation.  Recent changes in the last 24 hours include none.  Plan  Current plan is for placement by [facility]. Patient is not under full IVC at this time.   Lorre Nick, MD 03/22/21 [phone]

## 2021-03-22 NOTE — Tx Team (Signed)
Initial Treatment Plan 03/22/2021 12:23 PM Kathleen Lime Henney OBS:[id number]    PATIENT STRESSORS: Financial difficulties Health problems Occupational concerns Substance abuse   PATIENT STRENGTHS: Ability for insight Capable of independent living Communication skills Motivation for treatment/growth Physical Health Work skills   PATIENT IDENTIFIED PROBLEMS: anxiety  depression  Suicidal ideations  homelessness  Financial strain  Substance use/abuse           DISCHARGE CRITERIA:  Ability to meet basic life and health needs Improved stabilization in mood, thinking, and/or behavior Motivation to continue treatment in a less acute level of care Need for constant or close observation no longer present  PRELIMINARY DISCHARGE PLAN: Attend aftercare/continuing care group Attend 12-step recovery group Outpatient therapy Placement in alternative living arrangements  PATIENT/FAMILY INVOLVEMENT: This treatment plan has been presented to and reviewed with the patient, Damian Buckles.  The patient and family have been given the opportunity to ask questions and make suggestions.  Raylene Miyamoto, RN 03/22/2021, 12:23 PM

## 2021-03-23 MED ORDER — ADULT MULTIVITAMIN W/MINERALS CH
1.0000 | ORAL_TABLET | Freq: Every day | ORAL | Status: DC
  Administered 2021-03-23 – 2021-03-27 (×5): 1 via ORAL
  Filled 2021-03-23 (×6): qty 1

## 2021-03-23 MED ORDER — LOPERAMIDE HCL 2 MG PO CAPS: 2.0000 mg | ORAL_CAPSULE | ORAL | Status: AC | PRN

## 2021-03-23 MED ORDER — ONDANSETRON 4 MG PO TBDP: 4.0000 mg | ORAL_TABLET | Freq: Four times a day (QID) | ORAL | Status: AC | PRN

## 2021-03-23 MED ORDER — GABAPENTIN 100 MG PO CAPS
100.0000 mg | ORAL_CAPSULE | Freq: Two times a day (BID) | ORAL | Status: DC
  Administered 2021-03-23 – 2021-03-27 (×9): 100 mg via ORAL
  Filled 2021-03-23 (×15): qty 1

## 2021-03-23 MED ORDER — THIAMINE HCL 100 MG/ML IJ SOLN: 100.0000 mg | Freq: Once | INTRAMUSCULAR | Status: DC

## 2021-03-23 MED ORDER — LORAZEPAM 1 MG PO TABS
1.0000 mg | ORAL_TABLET | Freq: Every day | ORAL | Status: AC
  Administered 2021-03-26: 1 mg via ORAL
  Filled 2021-03-23: qty 1

## 2021-03-23 MED ORDER — LORAZEPAM 1 MG PO TABS
1.0000 mg | ORAL_TABLET | Freq: Two times a day (BID) | ORAL | Status: AC
  Administered 2021-03-23 – 2021-03-25 (×4): 1 mg via ORAL
  Filled 2021-03-23 (×4): qty 1

## 2021-03-23 MED ORDER — LORAZEPAM 1 MG PO TABS
1.0000 mg | ORAL_TABLET | Freq: Four times a day (QID) | ORAL | Status: AC | PRN
  Administered 2021-03-23 – 2021-03-25 (×5): 1 mg via ORAL
  Filled 2021-03-23 (×5): qty 1

## 2021-03-23 MED ORDER — THIAMINE HCL 100 MG PO TABS
100.0000 mg | ORAL_TABLET | Freq: Every day | ORAL | Status: DC
  Administered 2021-03-24 – 2021-03-27 (×4): 100 mg via ORAL
  Filled 2021-03-23 (×5): qty 1

## 2021-03-23 NOTE — Plan of Care (Signed)
  Problem: Education: Goal: Ability to state activities that reduce stress will improve Outcome: Progressing   Problem: Coping: Goal: Ability to identify and develop effective coping behavior will improve Outcome: Progressing   Problem: Self-Concept: Goal: Ability to identify factors that promote anxiety will improve Outcome: Progressing Goal: Level of anxiety will decrease Outcome: Progressing   

## 2021-03-23 NOTE — Tx Team (Signed)
Interdisciplinary Treatment and Diagnostic Plan Update  03/23/2021 Time of Session: Carlos Flowers MRN: [id number]  Principal Diagnosis: MDD (major depressive disorder), recurrent severe, without psychosis ([place])  Secondary Diagnoses: Principal Problem:   MDD (major depressive disorder), recurrent severe, without psychosis ([place])   Current Medications:  Current Facility-Administered Medications  Medication Dose Route Frequency Provider Last Rate Last Admin  . acetaminophen (TYLENOL) tablet 650 mg  650 mg Oral Q6H PRN Revonda Humphrey, NP      . alum & mag hydroxide-simeth (MAALOX/MYLANTA) 200-200-20 MG/5ML suspension 30 mL  30 mL Oral Q4H PRN Revonda Humphrey, NP      . gabapentin (NEURONTIN) capsule 100 mg  100 mg Oral BID Derrill Center, NP   100 mg at 03/23/21 1136  . hydrOXYzine (ATARAX/VISTARIL) tablet 25 mg  25 mg Oral TID PRN Lindell Spar I, NP   25 mg at 03/23/21 1136  . loperamide (IMODIUM) capsule 2-4 mg  2-4 mg Oral PRN Arthor Captain, MD      . LORazepam (ATIVAN) tablet 1 mg  1 mg Oral Q6H PRN Arthor Captain, MD   1 mg at 03/23/21 1347  . magnesium hydroxide (MILK OF MAGNESIA) suspension 30 mL  30 mL Oral Daily PRN Revonda Humphrey, NP      . multivitamin with minerals tablet 1 tablet  1 tablet Oral Daily Arthor Captain, MD   1 tablet at 03/23/21 1136  . nicotine polacrilex (NICORETTE) gum 2 mg  2 mg Oral PRN Arthor Captain, MD   2 mg at 03/23/21 1346  . ondansetron (ZOFRAN-ODT) disintegrating tablet 4 mg  4 mg Oral Q6H PRN Arthor Captain, MD      . thiamine (B-1) injection 100 mg  100 mg Intramuscular Once Arthor Captain, MD      . Derrill Memo ON 03/24/2021] thiamine tablet 100 mg  100 mg Oral Daily Arthor Captain, MD      . traZODone (DESYREL) tablet 50 mg  50 mg Oral QHS PRN Revonda Humphrey, NP   50 mg at 03/22/21 1952   PTA Medications: Medications Prior to Admission  Medication Sig Dispense Refill Last Dose  . chlordiazePOXIDE (LIBRIUM) 25 MG capsule 24m  PO TID x 1D, then 25-562mPO BID X 1D, then 25-5065mO QD X 1D (Patient not taking: Reported on 03/22/2021) 10 capsule 0   . cloNIDine (CATAPRES) 0.1 MG tablet Take 1 tablet (0.1 mg total) by mouth at bedtime. (Patient not taking: Reported on 03/22/2021) 30 tablet 0   . gabapentin (NEURONTIN) 600 MG tablet Take 600 mg by mouth 4 (four) times daily.     . hydrOXYzine (ATARAX/VISTARIL) 25 MG tablet Take 1 tablet (25 mg total) by mouth every 6 (six) hours as needed for anxiety. (Patient not taking: Reported on 03/22/2021) 12 tablet 0   . PARoxetine (PAXIL) 20 MG tablet Take 1 tablet (20 mg total) by mouth daily. (Patient not taking: Reported on 03/22/2021) 30 tablet 0     Patient Stressors: Financial difficulties Health problems Occupational concerns Substance abuse  Patient Strengths: Ability for insight Capable of independent living Communication skills Motivation for treatment/growth Physical Health Work skills  Treatment Modalities: Medication Management, Group therapy, Case management,  1 to 1 session with clinician, Psychoeducation, Recreational therapy.   Physician Treatment Plan for Primary Diagnosis: MDD (major depressive disorder), recurrent severe, without psychosis ([place] Term Goal(s): Improvement in symptoms so as ready for discharge Improvement in symptoms so as ready  for discharge   Short Term Goals: Ability to identify changes in lifestyle to reduce recurrence of condition will improve Ability to disclose and discuss suicidal ideas Ability to demonstrate self-control will improve Compliance with prescribed medications will improve Ability to identify changes in lifestyle to reduce recurrence of condition will improve Ability to verbalize feelings will improve Ability to disclose and discuss suicidal ideas Ability to identify triggers associated with substance abuse/mental health issues will improve  Medication Management: Evaluate patient's response, side effects, and  tolerance of medication regimen.  Therapeutic Interventions: 1 to 1 sessions, Unit Group sessions and Medication administration.  Evaluation of Outcomes: Not Met  Physician Treatment Plan for Secondary Diagnosis: Principal Problem:   MDD (major depressive disorder), recurrent severe, without psychosis ([place])  Long Term Goal(s): Improvement in symptoms so as ready for discharge Improvement in symptoms so as ready for discharge   Short Term Goals: Ability to identify changes in lifestyle to reduce recurrence of condition will improve Ability to disclose and discuss suicidal ideas Ability to demonstrate self-control will improve Compliance with prescribed medications will improve Ability to identify changes in lifestyle to reduce recurrence of condition will improve Ability to verbalize feelings will improve Ability to disclose and discuss suicidal ideas Ability to identify triggers associated with substance abuse/mental health issues will improve     Medication Management: Evaluate patient's response, side effects, and tolerance of medication regimen.  Therapeutic Interventions: 1 to 1 sessions, Unit Group sessions and Medication administration.  Evaluation of Outcomes: Not Met   RN Treatment Plan for Primary Diagnosis: MDD (major depressive disorder), recurrent severe, without psychosis ([place]) Long Term Goal(s): Knowledge of disease and therapeutic regimen to maintain health will improve  Short Term Goals: Ability to demonstrate self-control, Ability to participate in decision making will improve and Ability to verbalize feelings will improve  Medication Management: RN will administer medications as ordered by provider, will assess and evaluate patient's response and provide education to patient for prescribed medication. RN will report any adverse and/or side effects to prescribing provider.  Therapeutic Interventions: 1 on 1 counseling sessions, Psychoeducation, Medication  administration, Evaluate responses to treatment, Monitor vital signs and CBGs as ordered, Perform/monitor CIWA, COWS, AIMS and Fall Risk screenings as ordered, Perform wound care treatments as ordered.  Evaluation of Outcomes: Not Met   LCSW Treatment Plan for Primary Diagnosis: MDD (major depressive disorder), recurrent severe, without psychosis ([place]) Long Term Goal(s): Safe transition to appropriate next level of care at discharge, Engage patient in therapeutic group addressing interpersonal concerns.  Short Term Goals: Engage patient in aftercare planning with referrals and resources, Increase social support and Increase ability to appropriately verbalize feelings  Therapeutic Interventions: Assess for all discharge needs, 1 to 1 time with Social worker, Explore available resources and support systems, Assess for adequacy in community support network, Educate family and significant other(s) on suicide prevention, Complete Psychosocial Assessment, Interpersonal group therapy.  Evaluation of Outcomes: Not Met   Progress in Treatment: Attending groups: No. Participating in groups: No. Taking medication as prescribed: Yes. Toleration medication: Yes. Family/Significant other contact made: No, will contact:  once CSW provides consents Patient understands diagnosis: No. Discussing patient identified problems/goals with staff: No. Medical problems stabilized or resolved: Yes. Denies suicidal/homicidal ideation: Yes. Issues/concerns per patient self-inventory: No. Other: None  New problem(s) identified: No, Describe:  None  New Short Term/Long Term Goal(s):medication stabilization, elimination of SI thoughts, development of comprehensive mental wellness plan.  Patient Goals:  Unable to attend  Discharge Plan or  Barriers: Patient recently admitted. CSW will continue to follow and assess for appropriate referrals and possible discharge planning.  Reason for Continuation of  Hospitalization: Anxiety Depression Medication stabilization Suicidal ideation  Estimated Length of Stay: 3-5 days  Attendees: Patient: 03/23/2021   Physician:  03/23/2021   Nursing:  03/23/2021   RN Care Manager: 03/23/2021   Social Worker: Toney Reil, Latanya Presser 03/23/2021   Recreational Therapist:  03/23/2021   Other:  03/23/2021   Other:  03/23/2021   Other: 03/23/2021      Scribe for Treatment Team: Mliss Fritz, Latanya Presser 03/23/2021 2:28 PM

## 2021-03-23 NOTE — [facility] Group Notes (Signed)
Type of Therapy and Topic: Group Therapy: Anger Management   Participation: Did Not Attend  Description of Group: In this group, patients will learn helpful strategies and techniques to manage anger, express anger in alternative ways, change hostile attitudes, and prevent aggressive acts, such as verbal abuse and violence.This group will be process-oriented and eductional, with patients participating in exploration of their own experiences as well as giving and receiving support and challenge from other group members.  Therapeutic Goals: 1. Patient will learn to manage anger. 2. Patient will learn to stop violence or the threat of violence. 3. Patient will learn to develop self control over thoughts and actions. 4. Patient will receive support and feedback from others  Therapeutic Modalities: Cognitive Behavioral Therapy Solution Focused Therapy Motivational Interviewing  Fredirick Lathe, LCSWA Clinicial Social Worker [employer]

## 2021-03-23 NOTE — Progress Notes (Signed)
Recreation Therapy Notes  Date:  4.22.22 Time: 0930 Location: [address] Dayroom  Group Topic: Stress Management  Goal Area(s) Addresses:  Patient will identify positive stress management techniques. Patient will identify benefits of using stress management post d/c.  Intervention: Stress Management  Activity :  Progressive Muscle Relaxation.  LRT read a script that guided patients in tensing each muscle group individually and then relaxing it to release any tension they may have in the body.  Education:  Stress Management, Discharge Planning.   Education Outcome: Acknowledges Education  Clinical Observations/Feedback: Pt did not attend group session.    Caroll Rancher, LRT/CTRS         Lillia Abed, Leightyn Cina A 03/23/2021 11:02 AM

## 2021-03-23 NOTE — Progress Notes (Signed)
Progress note    03/23/21 1100  Psych Admission Type (Psych Patients Only)  Admission Status Voluntary  Psychosocial Assessment  Patient Complaints Anxiety;Depression  Eye Contact Fair  Facial Expression Anxious;Pensive  Affect Anxious  Speech Logical/coherent  Interaction Assertive  Motor Activity Fidgety  Appearance/Hygiene In scrubs  Behavior Characteristics Cooperative;Appropriate to situation;Anxious  Mood Anxious;Pleasant  Thought Process  Coherency Concrete thinking  Content Blaming self  Delusions None reported or observed  Perception WDL  Hallucination None reported or observed  Judgment Poor  Confusion None  Danger to Self  Current suicidal ideation? Denies  Self-Injurious Behavior No self-injurious ideation or behavior indicators observed or expressed   Danger to Others  Danger to Others None reported or observed

## 2021-03-23 NOTE — H&P (Signed)
Psychiatric Admission Assessment Adult  Patient Identification: Carlos Flowers MRN:  [id number] Date of Evaluation:  03/23/2021 Chief Complaint:  MDD (major depressive disorder), recurrent severe, without psychosis (HCC) [F33.2] Principal Diagnosis: MDD (major depressive disorder), recurrent severe, without psychosis (HCC) Diagnosis:  Principal Problem:   MDD (major depressive disorder), recurrent severe, without psychosis (HCC)  History of Present Illness: Carlos Flowers is a 40 year-old Caucasian male that presents with depression and suicidal ideations, denied plan or intent..  Charted history with major depressive disorder, anxiety, substance abuse alcohol and opiate use/ abuse.   Carlos Flowers reports he has been experiencing chronic suicidal ideations for the past 6 months.  States " my family will not have anything to do with me."  Denies that he is currently employed. Stated that he is currently homeless and is unable to stop drink alcohol.  Patient reports intended multiple rehabilitation facilities in the past.  He states he was recently discharged from [facility] few weeks prior.  States he [facility],but stated that he discharge hisself.   Reported family history of mental illness: Mother: Anxiety depression.  Carlos Flowers is the maximum that it can states " only thoughts".  Reports worthlessness, depression, decreased concentration and anxiety.  Denies taking medications as directed.  States he is only prescribed gabapentin 300 mg 4 times a day.  States last having medication was 1 week prior.  Reports a decreased appetite.  Denies insomnia or insomnia symptoms.  Patient to be admitted to inpatient for mood stabilization.   Associated Signs/Symptoms: Depression Symptoms:  depressed mood, feelings of worthlessness/guilt, difficulty concentrating, suicidal thoughts without plan, Duration of Depression Symptoms: No data recorded (Hypo) Manic Symptoms:   Distractibility, Anxiety Symptoms:  Excessive Worry, Social Anxiety, Psychotic Symptoms:  Hallucinations: None PTSD Symptoms: NA Total Time spent with patient: 15 minutes  Past Psychiatric History:   Is the patient at risk to self? Yes.    Has the patient been a risk to self in the past 6 months? Yes.    Has the patient been a risk to self within the distant past? No.  Is the patient a risk to others? No.  Has the patient been a risk to others in the past 6 months? No.  Has the patient been a risk to others within the distant past? No.   Prior Inpatient Therapy:   Prior Outpatient Therapy:    Alcohol Screening: 1. How often do you have a drink containing alcohol?: 4 or more times a week 2. How many drinks containing alcohol do you have on a typical day when you are drinking?: 10 or more 3. How often do you have six or more drinks on one occasion?: Daily or almost daily AUDIT-C Score: 12 4. How often during the last year have you found that you were not able to stop drinking once you had started?: Daily or almost daily 5. How often during the last year have you failed to do what was normally expected from you because of drinking?: Daily or almost daily 6. How often during the last year have you needed a first drink in the morning to get yourself going after a heavy drinking session?: Weekly 7. How often during the last year have you had a feeling of guilt of remorse after drinking?: Daily or almost daily 8. How often during the last year have you been unable to remember what happened the night before because you had been drinking?: Weekly 9. Have you or someone else been injured  as a result of your drinking?: No 10. Has a relative or friend or a doctor or another health worker been concerned about your drinking or suggested you cut down?: No Alcohol Use Disorder Identification Test Final Score (AUDIT): 30 Alcohol Brief Interventions/Follow-up: Alcohol education/Brief advice Substance  Abuse History in the last 12 months:  Yes.   Consequences of Substance Abuse: NA Previous Psychotropic Medications: No  Psychological Evaluations: No  Past Medical History:  Past Medical History:  Diagnosis Date  . Alcoholism (HCC)   . Anxiety   . Depression   . Substance abuse (HCC)    History reviewed. No pertinent surgical history. Family History:  Family History  Problem Relation Age of Onset  . Cancer Maternal Grandmother   . Heart disease Paternal Grandmother   . Heart disease Paternal Grandfather    Family Psychiatric  History:  Tobacco Screening: Have you used any form of tobacco in the last 30 days? (Cigarettes, Smokeless Tobacco, Cigars, and/or Pipes): Yes Tobacco use, Select all that apply: 5 or more cigarettes per day Are you interested in Tobacco Cessation Medications?: No, patient refused Counseled patient on smoking cessation including recognizing danger situations, developing coping skills and basic information about quitting provided: Yes Social History:  Social History   Substance and Sexual Activity  Alcohol Use Yes  . Alcohol/week: 10.0 - 12.0 standard drinks  . Types: 10 - 12 Cans of beer per week     Social History   Substance and Sexual Activity  Drug Use Not Currently  . Types: Cocaine, Heroin   Comment: last use 12/17/2018    Additional Social History:                           Allergies:   Allergies  Allergen Reactions  . Peanut-Containing Drug Products Anaphylaxis   Lab Results:  Results for orders placed or performed during the hospital encounter of 03/21/21 (from the past 48 hour(s))  Rapid urine drug screen (hospital performed)     Status: None   Collection Time: 03/21/21 11:32 AM  Result Value Ref Range   Opiates NONE DETECTED NONE DETECTED   Cocaine NONE DETECTED NONE DETECTED   Benzodiazepines NONE DETECTED NONE DETECTED   Amphetamines NONE DETECTED NONE DETECTED   Tetrahydrocannabinol NONE DETECTED NONE DETECTED    Barbiturates NONE DETECTED NONE DETECTED    Comment: (NOTE) DRUG SCREEN FOR MEDICAL PURPOSES ONLY.  IF CONFIRMATION IS NEEDED FOR ANY PURPOSE, NOTIFY LAB WITHIN 5 DAYS.  LOWEST DETECTABLE LIMITS FOR URINE DRUG SCREEN Drug Class                     Cutoff (ng/mL) Amphetamine and metabolites    1000 Barbiturate and metabolites    200 Benzodiazepine                 200 Tricyclics and metabolites     300 Opiates and metabolites        300 Cocaine and metabolites        300 THC                            50 Performed at [facility], [address] [place], [place] [address]   Comprehensive metabolic panel     Status: Abnormal   Collection Time: 03/21/21 11:45 AM  Result Value Ref Range   Sodium 135 135 - 145 mmol/L  Potassium 4.0 3.5 - 5.1 mmol/L   Chloride 102 98 - 111 mmol/L   CO2 21 (L) 22 - 32 mmol/L   Glucose, Bld 101 (H) 70 - 99 mg/dL    Comment: Glucose reference range applies only to samples taken after fasting for at least 8 hours.   BUN 13 6 - 20 mg/dL   Creatinine, Ser 1.61 0.61 - 1.24 mg/dL   Calcium 9.6 8.9 - 09.6 mg/dL   Total Protein 9.2 (H) 6.5 - 8.1 g/dL   Albumin 5.1 (H) 3.5 - 5.0 g/dL   AST 28 15 - 41 U/L   ALT 30 0 - 44 U/L   Alkaline Phosphatase 83 38 - 126 U/L   Total Bilirubin 0.9 0.3 - 1.2 mg/dL   GFR, Estimated >04 >54 mL/min    Comment: (NOTE) Calculated using the CKD-EPI Creatinine Equation (2021)    Anion gap 12 5 - 15    Comment: Performed at [facility], [address] [place], [place] [address]  Ethanol     Status: None   Collection Time: 03/21/21 11:45 AM  Result Value Ref Range   Alcohol, Ethyl (B) <10 <10 mg/dL    Comment: (NOTE) Lowest detectable limit for serum alcohol is 10 mg/dL.  For medical purposes only. Performed at [facility], [address], [place], [place] [address]   Salicylate level     Status: Abnormal   Collection Time: 03/21/21 11:45 AM  Result Value  Ref Range   Salicylate Lvl <7.0 (L) 7.0 - 30.0 mg/dL    Comment: Performed at [facility], [address], [place], [place] [address]  Acetaminophen level     Status: Abnormal   Collection Time: 03/21/21 11:45 AM  Result Value Ref Range   Acetaminophen (Tylenol), Serum <10 (L) 10 - 30 ug/mL    Comment: (NOTE) Therapeutic concentrations vary significantly. A range of 10-30 ug/mL  may be an effective concentration for many patients. However, some  are best treated at concentrations outside of this range. Acetaminophen concentrations >150 ug/mL at 4 hours after ingestion  and >50 ug/mL at 12 hours after ingestion are often associated with  toxic reactions.  Performed at [facility], [address] [place], [place] [address]   cbc     Status: None   Collection Time: 03/21/21 11:45 AM  Result Value Ref Range   WBC 8.2 4.0 - 10.5 K/uL   RBC 5.18 4.22 - 5.81 MIL/uL   Hemoglobin 16.3 13.0 - 17.0 g/dL   HCT 57.8 46.9 - 62.9 %   MCV 95.2 80.0 - 100.0 fL   MCH 31.5 26.0 - 34.0 pg   MCHC 33.1 30.0 - 36.0 g/dL   RDW 52.8 41.3 - 24.4 %   Platelets 261 150 - 400 K/uL   nRBC 0.0 0.0 - 0.2 %    Comment: Performed at [facility], [address] [place], [place] [address]  Resp Panel by RT-PCR (Flu A&B, Covid) Nasopharyngeal Swab     Status: None   Collection Time: 03/21/21 12:21 PM   Specimen: Nasopharyngeal Swab; Nasopharyngeal(NP) swabs in vial transport medium  Result Value Ref Range   SARS Coronavirus 2 by RT PCR NEGATIVE NEGATIVE    Comment: (NOTE) SARS-CoV-2 target nucleic acids are NOT DETECTED.  The SARS-CoV-2 RNA is generally detectable in upper respiratory specimens during the acute phase of infection. The lowest concentration of SARS-CoV-2 viral copies this assay can detect is 138  copies/mL. A negative result does not preclude SARS-Cov-2 infection and should not be used as the sole basis for treatment or other patient  management decisions. A negative result may occur with  improper specimen collection/handling, submission of specimen other than nasopharyngeal swab, presence of viral mutation(s) within the areas targeted by this assay, and inadequate number of viral copies(<138 copies/mL). A negative result must be combined with clinical observations, patient history, and epidemiological information. The expected result is Negative.  Fact Sheet for Patients:  BloggerCourse.com  Fact Sheet for Healthcare Providers:  SeriousBroker.it  This test is no t yet approved or cleared by the [place] FDA and  has been authorized for detection and/or diagnosis of SARS-CoV-2 by FDA under an Emergency Use Authorization (EUA). This EUA will remain  in effect (meaning this test can be used) for the duration of the COVID-19 declaration under Section 564(b)(1) of the Act, 21 U.S.C.section 360bbb-3(b)(1), unless the authorization is terminated  or revoked sooner.       Influenza A by PCR NEGATIVE NEGATIVE   Influenza B by PCR NEGATIVE NEGATIVE    Comment: (NOTE) The Xpert Xpress SARS-CoV-2/FLU/RSV plus assay is intended as an aid in the diagnosis of influenza from Nasopharyngeal swab specimens and should not be used as a sole basis for treatment. Nasal washings and aspirates are unacceptable for Xpert Xpress SARS-CoV-2/FLU/RSV testing.  Fact Sheet for Patients: BloggerCourse.com  Fact Sheet for Healthcare Providers: SeriousBroker.it  This test is not yet approved or cleared by the [place] FDA and has been authorized for detection and/or diagnosis of SARS-CoV-2 by FDA under an Emergency Use Authorization (EUA). This EUA will remain in effect (meaning this test can be used) for the duration of the COVID-19 declaration under Section 564(b)(1) of the Act, 21 U.S.C. section 360bbb-3(b)(1), unless the  authorization is terminated or revoked.  Performed at [facility], [address], [place], [place] [address]     Blood Alcohol level:  Lab Results  Component Value Date   [facility] <10 03/21/2021   ETH <10 03/16/2021    Metabolic Disorder Labs:  Lab Results  Component Value Date   HGBA1C 5.5 08/08/2020   MPG 111.15 08/08/2020   No results found for: PROLACTIN Lab Results  Component Value Date   CHOL 129 08/08/2020   TRIG 111 08/08/2020   HDL 45 08/08/2020   CHOLHDL 2.9 08/08/2020   VLDL 22 08/08/2020   LDLCALC 62 08/08/2020    Current Medications: Current Facility-Administered Medications  Medication Dose Route Frequency Provider Last Rate Last Admin  . acetaminophen (TYLENOL) tablet 650 mg  650 mg Oral Q6H PRN Ardis Hughs, NP      . alum & mag hydroxide-simeth (MAALOX/MYLANTA) 200-200-20 MG/5ML suspension 30 mL  30 mL Oral Q4H PRN Ardis Hughs, NP      . gabapentin (NEURONTIN) capsule 100 mg  100 mg Oral BID Oneta Rack, NP      . hydrOXYzine (ATARAX/VISTARIL) tablet 25 mg  25 mg Oral TID PRN Armandina Stammer I, NP   25 mg at 03/22/21 1826  . loperamide (IMODIUM) capsule 2-4 mg  2-4 mg Oral PRN Claudie Revering, MD      . LORazepam (ATIVAN) tablet 1 mg  1 mg Oral Q6H PRN Claudie Revering, MD      . magnesium hydroxide (MILK OF MAGNESIA) suspension 30 mL  30 mL Oral Daily PRN Ardis Hughs, NP      . multivitamin with minerals tablet  1 tablet  1 tablet Oral Daily Claudie Revering, MD      . nicotine polacrilex (NICORETTE) gum 2 mg  2 mg Oral PRN Claudie Revering, MD   2 mg at 03/23/21 [phone]  . ondansetron (ZOFRAN-ODT) disintegrating tablet 4 mg  4 mg Oral Q6H PRN Claudie Revering, MD      . thiamine (B-1) injection 100 mg  100 mg Intramuscular Once Claudie Revering, MD      . Melene Muller ON 03/24/2021] thiamine tablet 100 mg  100 mg Oral Daily Claudie Revering, MD      . traZODone (DESYREL) tablet 50 mg  50 mg Oral QHS PRN Ardis Hughs, NP   50  mg at 03/22/21 1952   PTA Medications: Medications Prior to Admission  Medication Sig Dispense Refill Last Dose  . chlordiazePOXIDE (LIBRIUM) 25 MG capsule 50mg  PO TID x 1D, then 25-50mg  PO BID X 1D, then 25-50mg  PO QD X 1D (Patient not taking: Reported on 03/22/2021) 10 capsule 0   . cloNIDine (CATAPRES) 0.1 MG tablet Take 1 tablet (0.1 mg total) by mouth at bedtime. (Patient not taking: Reported on 03/22/2021) 30 tablet 0   . gabapentin (NEURONTIN) 600 MG tablet Take 600 mg by mouth 4 (four) times daily.     . hydrOXYzine (ATARAX/VISTARIL) 25 MG tablet Take 1 tablet (25 mg total) by mouth every 6 (six) hours as needed for anxiety. (Patient not taking: Reported on 03/22/2021) 12 tablet 0   . PARoxetine (PAXIL) 20 MG tablet Take 1 tablet (20 mg total) by mouth daily. (Patient not taking: Reported on 03/22/2021) 30 tablet 0     Musculoskeletal: Strength & Muscle Tone: within normal limits Gait & Station: normal Patient leans: N/A            Psychiatric Specialty Exam:  Presentation  General Appearance: Disheveled  Eye Contact:Minimal  Speech:Clear and Coherent  Speech Volume:Normal  Handedness:Right   Mood and Affect  Mood:Depressed; Anxious; Hopeless; Worthless  Affect:Congruent; Depressed   Thought Process  Thought Processes:Coherent  Duration of Psychotic Symptoms: No data recorded Past Diagnosis of Schizophrenia or Psychoactive disorder: No  Descriptions of Associations:Intact  Orientation:Full (Time, Place and Person)  Thought Content:Logical  Hallucinations:No data recorded Ideas of Reference:None  Suicidal Thoughts:No data recorded Homicidal Thoughts:No data recorded  Sensorium  Memory:Immediate Good; Recent Good; Remote Good  Judgment:Poor  Insight:Fair   Executive Functions  Concentration:Good  Attention Span:Good  Recall:Good  Fund of Knowledge:Good  Language:Good   Psychomotor Activity  Psychomotor Activity:No data  recorded  Assets  Assets:Communication Skills; Desire for Improvement; Physical Health; Resilience; Social Support   Sleep  Sleep:No data recorded   Physical Exam: Physical Exam Vitals reviewed.  Psychiatric:        Mood and Affect: Mood normal.        Thought Content: Thought content normal.    Review of Systems  Psychiatric/Behavioral: Positive for depression, substance abuse and suicidal ideas. The patient is nervous/anxious.   All other systems reviewed and are negative.  Blood pressure 121/89, pulse (!) 115, temperature (!) 97.5 F (36.4 C), temperature source Oral, resp. rate 18, height 6' (1.829 m), weight 97.5 kg, SpO2 97 %. Body mass index is 29.16 kg/m.  Treatment Plan Summary: Daily contact with patient to assess and evaluate symptoms and progress in treatment and Medication management  Ativan protocol CIWA Initiated gabapentin 100 mg p.o. 3 times daily   Observation Level/Precautions:  15 minute checks  Laboratory:  CBC Chemistry  Profile UDS UA  Psychotherapy: Individual l and group sessions   Medications:  See chart   Consultations:  CSW and Psychiatry   Discharge Concerns:  Safety, stabilization, and risk of access to medication and medication stabilization   Estimated LOS:5-7 days   Other:     Physician Treatment Plan for Primary Diagnosis: MDD (major depressive disorder), recurrent severe, without psychosis (HCC) Long Term Goal(s): Improvement in symptoms so as ready for discharge  Short Term Goals: Ability to identify changes in lifestyle to reduce recurrence of condition will improve, Ability to disclose and discuss suicidal ideas, Ability to demonstrate self-control will improve and Compliance with prescribed medications will improve  Physician Treatment Plan for Secondary Diagnosis: Principal Problem:   MDD (major depressive disorder), recurrent severe, without psychosis (HCC)  Long Term Goal(s): Improvement in symptoms so as ready for  discharge  Short Term Goals: Ability to identify changes in lifestyle to reduce recurrence of condition will improve, Ability to verbalize feelings will improve, Ability to disclose and discuss suicidal ideas and Ability to identify triggers associated with substance abuse/mental health issues will improve  I certify that inpatient services furnished can reasonably be expected to improve the patient's condition.    Oneta Rackanika N Jaleea Alesi, NP 4/22/202211:27 AM

## 2021-03-23 NOTE — BHH Suicide Risk Assessment (Signed)
[facility] Admission Suicide Risk Assessment   Nursing information obtained from:  Patient Demographic factors:  Male,Living alone,Caucasian,Unemployed,Low socioeconomic status Current Mental Status:  Suicidal ideation indicated by patient,Plan includes specific time, place, or method,Intention to act on suicide plan,Self-harm thoughts,Suicide plan Loss Factors:  Decline in physical health,Financial problems / change in socioeconomic status Historical Factors:  Impulsivity Risk Reduction Factors:  NA  Total Time spent with patient: 25 minutes Principal Problem: MDD (major depressive disorder), recurrent severe, without psychosis ([place]) Diagnosis:  Principal Problem:   MDD (major depressive disorder), recurrent severe, without psychosis ([place])  Subjective Data: Medical record reviewed.  Patient's case discussed in detail with members of the treatment team.  I met with and interviewed the patient today on the unit in the patient's room.  Carlos Flowers is a 40 year old male with a history of major depressive disorder recurrent, anxiety, alcohol use disorder and alcohol withdrawal related seizures who presented as a walk-in to [facility] for worsening depression, anxiety and suicidal ideation with a plan to cut his wrist in the context of ongoing alcohol use.  Last reported drink was on 03/19/2021.  On initial evaluation at [facility] reported alcohol withdrawal symptoms of sweats and increased anxiety and was sent to [facility] for assessment of medical stability for [facility] admission.  He was given Librium 50 mg once there and then started on Librium 25 mg every 6 hours as needed anxiety and was transferred back to [facility].  On interview today, the patient reports that he is in the hospital because "I am just trying to get into a treatment center for alcohol and addiction."  He reports a long history of problems with depression and has undergone multiple medication trials but cannot recall any details.  Additionally, the patient reports a  history of approximately 6-7 prior inpatient psychiatric admissions for depression, anxiety, suicidal ideation or addiction.  His most recent inpatient psychiatric hospitalization was at [facility] in [place] in 2021.  Patient was treated with Paxil and Remeron for major depressive disorder during that admission.  He did not stay on those medications and just restarted Paxil a couple of weeks ago and does not find it to be helpful.  The patient denies any history of suicide attempts however review of chart records indicates that he previously reported a history of multiple prior suicide attempts in the past.  He reports problems with anxiety and panic attacks that are accompanied by rapid heart rate, lightheadedness, blurred vision and occur out of the blue lasting 1 to 2 hours at a time.  He reports he has been consuming alcohol (1 case of beer per day).  He has a history of past alcohol withdrawal symptoms and states that he is currently experiencing withdrawal symptoms including diaphoresis, decreased concentration, high anxiety and nausea.  The symptoms have improved since he was started on Librium.  On my assessment, the patient has no visible tremor.  Blood pressure this morning was 137/91 sitting and 121/89 standing.  Heart rate was 109 sitting and 115 standing.  At 1347 this afternoon BP was 116/87 and pulse was 101.  The patient that he has used multiple different drugs in the past but none recently.  He denies access to firearms.  He denies any family history of suicide.  He reports maternal relatives with a history of depression and anxiety and paternal relatives with a history of substance issues.  Patient reports that his current episode of depression which is lasted several months has been the worst that he  can recall.  Current depressive symptoms include sad mood, anhedonia, insomnia, decreased appetite, decreased concentration, low energy, anxiety and suicidal ideation.  He denies thoughts  of harming himself in the hospital.  The patient has no known drug allergies and denies any history of medical problems other than the history of seizures in the context of alcohol withdrawal in the past.  Continued Clinical Symptoms:  Alcohol Use Disorder Identification Test Final Score (AUDIT): 30 The "Alcohol Use Disorders Identification Test", Guidelines for Use in Primary Care, Second Edition.  World Pharmacologist [facility]). Score between 0-7:  no or low risk or alcohol related problems. Score between 8-15:  moderate risk of alcohol related problems. Score between 16-19:  high risk of alcohol related problems. Score 20 or above:  warrants further diagnostic evaluation for alcohol dependence and treatment.   CLINICAL FACTORS:   Severe Anxiety and/or Agitation Depression:   Anhedonia Insomnia Alcohol/Substance Abuse/Dependencies More than one psychiatric diagnosis Unstable or Poor Therapeutic Relationship Previous Psychiatric Diagnoses and Treatments   Musculoskeletal: Strength & Muscle Tone: within normal limits Gait & Station: normal Patient leans: N/A  Psychiatric Specialty Exam:  Presentation  General Appearance: Disheveled  Eye Contact:Minimal  Speech:Clear and Coherent  Speech Volume:Normal  Handedness:Right   Mood and Affect  Mood:Depressed; Anxious; Hopeless; Worthless  Affect:Congruent; Depressed   Social worker Processes:Coherent  Descriptions of Associations:Intact  Orientation:Full (Time, Place and Person)  Thought Content:Logical  History of Schizophrenia/Schizoaffective disorder:No  Duration of Psychotic Symptoms:No data recorded Hallucinations:No data recorded Ideas of Reference:None  Suicidal Thoughts:No data recorded Homicidal Thoughts:No data recorded  Sensorium  Memory:Immediate Good; Recent Good; Remote Good  Judgment:Poor  Insight:Fair   Executive Functions  Concentration:Good  Attention  Span:Good  [place] of Knowledge:Good  Language:Good   Psychomotor Activity  Psychomotor Activity:No data recorded  Assets  Assets:Communication Skills; Desire for Improvement; Physical Health; Resilience; Social Support   Sleep  Sleep:No data recorded   Physical Exam: Physical Exam Vitals and nursing note reviewed.  HENT:     Head: Normocephalic and atraumatic.  Neurological:     General: No focal deficit present.     Mental Status: He is alert and oriented to person, place, and time.    Review of Systems  Constitutional: Positive for diaphoresis. Negative for fever.  HENT: Negative for sore throat.   Eyes: Negative for blurred vision.  Respiratory: Negative for cough and shortness of breath.   Cardiovascular: Negative for chest pain.  Gastrointestinal: Positive for nausea. Negative for constipation, diarrhea and vomiting.  Musculoskeletal: Negative for myalgias.  Neurological: Positive for tremors. Negative for dizziness.  Psychiatric/Behavioral: Positive for depression, substance abuse and suicidal ideas. Negative for hallucinations. The patient is nervous/anxious and has insomnia.    Blood pressure 116/87, pulse (!) 101, temperature (!) 97.5 F (36.4 C), temperature source Oral, resp. rate 18, height 6' (1.829 m), weight 97.5 kg, SpO2 97 %. Body mass index is 29.16 kg/m.   COGNITIVE FEATURES THAT CONTRIBUTE TO RISK:  Thought constriction (tunnel vision)    SUICIDE RISK:   Moderate:  Frequent suicidal ideation with limited intensity, and duration, some specificity in terms of plans, no associated intent, good self-control, limited dysphoria/symptomatology, some risk factors present, and identifiable protective factors, including available and accessible social support.  PLAN OF CARE: Every 15 minute observation status.  Encourage participation in group therapy and therapeutic milieu.  Available lab results reviewed.  CMP revealed CO2 of 21, glucose of  101, albumin of 5.1 and total protein  of 9.2 but was otherwise WNL.  CBC was WNL.  BAL was less than 10.  Urine drug screen was negative.  A1c, lipid panel and TSH have been ordered for next lab draw.  The patient has been started on CIWA protocol with multivitamin and thiamine as well as as needed lorazepam every 6 hours for CIWA scores greater than 10.  Additionally, Will give standing dose lorazepam 1 mg twice daily for 4 doses then 1 mg daily for 2 doses.  I have encouraged patient to aggressively hydrate with p.o. fluids.  Will defer initiation of antidepressant for now since patient is experiencing some anxiety in the context of detox from alcohol.  Anticipate initiation of antidepressant prior to discharge.  Patient is interested in going to residential treatment center for alcohol use disorder after discharge from [facility].  I certify that inpatient services furnished can reasonably be expected to improve the patient's condition.   Arthor Captain, MD 03/23/2021, 1:55 PM

## 2021-03-24 DIAGNOSIS — F332 Major depressive disorder, recurrent severe without psychotic features: Principal | ICD-10-CM

## 2021-03-24 LAB — LIPID PANEL
Cholesterol: 142 mg/dL (ref 0–200)
HDL: 39 mg/dL — ABNORMAL LOW (ref >40)
LDL Cholesterol: 91 mg/dL (ref 0–99)
Total CHOL/HDL Ratio: 3.6 RATIO
Triglycerides: 58 mg/dL (ref <150)
VLDL: 12 mg/dL (ref 0–40)

## 2021-03-24 LAB — HEMOGLOBIN A1C
Hgb A1c MFr Bld: 5.7 % — ABNORMAL HIGH (ref 4.8–5.6)
Mean Plasma Glucose: 116.89 mg/dL

## 2021-03-24 LAB — TSH: TSH: 1.052 u[IU]/mL (ref 0.350–4.500)

## 2021-03-24 NOTE — B[facility] Group Notes (Signed)
LCSW Group Therapy Note  03/24/2021     10:00-11:00AM  Type of Therapy and Topic:  Group Therapy:  Decisional Balance Exercise for Substance Use  Participation Level:  Minimal        . Description of Group:  The main focus of today's process group was learning how to use a decisional balance exercise to make a decision about whether to change an unhealthy coping skill, as well as how to use the information gathered in the actual process of planning that change.  Stages of Change were explained, then patients listed some of their most frequently utilized unhealthy coping techniques and CSW pointed out the similarities.  Motivational Interviewing and the whiteboard were utilized to help patients explore in-depth the perceived benefits and costs of a specific, shared unhealthy coping technique (drinking & drugging) as well as the benefits and costs of replacing that with other, healthy coping skills.    Therapeutic Goals 1. Patient will be able to utilize the decision balance exercise on their own 2. Patient will list coping skills they use to fulfill their needs 3. Patient will identify the differences between healthy and  unhealthy coping skills 4. Patient will verbalize the costs and benefits of drinking/drugging versus making the choice to change 5. Patient will learn how to use the exercise to identify the most important supports to put in place so that they can succeed in a change to which they commit  Summary of Patient Progress: During group, patient expressed that he self-sabotages with alcohol and isolation.  While he was attentive to others who spoke, he did not contribute to the discussion at any point.  He was eventually called out to see a provider and did not return.   Therapeutic Modalities Cognitive Behavioral Therapy Motivational Interviewing   Lynnell Chad, LCSW

## 2021-03-24 NOTE — Progress Notes (Signed)
   03/23/21 2319  Psych Admission Type (Psych Patients Only)  Admission Status Voluntary  Psychosocial Assessment  Patient Complaints Anxiety  Eye Contact Fair  Facial Expression Anxious;Pensive  Affect Anxious  Speech Logical/coherent  Interaction Assertive  Motor Activity Fidgety  Appearance/Hygiene In scrubs  Behavior Characteristics Cooperative;Appropriate to situation  Mood Anxious  Thought Process  Coherency Concrete thinking  Content Blaming self  Delusions None reported or observed  Perception WDL  Hallucination None reported or observed  Judgment Poor  Confusion None  Danger to Self  Current suicidal ideation? Passive  Self-Injurious Behavior No self-injurious ideation or behavior indicators observed or expressed   Agreement Not to Harm Self Yes  Description of Agreement contracts for safety  Danger to Others  Danger to Others None reported or observed

## 2021-03-24 NOTE — Progress Notes (Signed)
   03/24/21 1057  Psych Admission Type (Psych Patients Only)  Admission Status Voluntary  Psychosocial Assessment  Patient Complaints Anxiety;Depression;Hopelessness  Eye Contact Fair  Facial Expression Anxious;Pensive  Affect Anxious  Speech Logical/coherent  Interaction Assertive  Motor Activity Fidgety  Appearance/Hygiene In scrubs  Behavior Characteristics Cooperative;Anxious  Mood Anxious  Thought Process  Coherency Concrete thinking  Content Blaming self  Delusions None reported or observed  Perception WDL  Hallucination None reported or observed  Judgment Poor  Confusion None  Danger to Self  Current suicidal ideation? Passive  Self-Injurious Behavior No self-injurious ideation or behavior indicators observed or expressed   Agreement Not to Harm Self Yes  Description of Agreement contracts for safety  Danger to Others  Danger to Others None reported or observed

## 2021-03-24 NOTE — Progress Notes (Signed)
[place] NOVEL CORONAVIRUS (COVID-19) DAILY CHECK-OFF SYMPTOMS - answer yes or no to each - every day NO YES  Have you had a fever in the past 24 hours?  . Fever (Temp > 37.80C / 100F) X   Have you had any of these symptoms in the past 24 hours? . New Cough .  Sore Throat  .  Shortness of Breath .  Difficulty Breathing .  Unexplained Body Aches   X   Have you had any one of these symptoms in the past 24 hours not related to allergies?   . Runny Nose .  Nasal Congestion .  Sneezing   X   If you have had runny nose, nasal congestion, sneezing in the past 24 hours, has it worsened?  X   EXPOSURES - check yes or no X   Have you traveled outside the state in the past 14 days?  X   Have you been in contact with someone with a confirmed diagnosis of COVID-19 or PUI in the past 14 days without wearing appropriate PPE?  X   Have you been living in the same home as a person with confirmed diagnosis of COVID-19 or a PUI (household contact)?    X   Have you been diagnosed with COVID-19?    X              What to do next: Answered NO to all: Answered YES to anything:   Proceed with unit schedule Follow the [facility] Inpatient Flowsheet.   

## 2021-03-24 NOTE — BHH Counselor (Signed)
Adult Comprehensive Assessment  Patient ID: Carlos Flowers, male   DOB: 03/21/1981, 40 y.o.   MRN: [id number]  Information Source: Information source: Patient  Current Stressors:  Patient states their primary concerns and needs for treatment are:: Suicidal thoughts, alcohol withdrawal, major anxiety Patient states their goals for this hospitilization and ongoing recovery are:: Get back to mental stability, get on the right meds, find placement Educational / Learning stressors: Denies stressors Employment / Job issues: Is unemployed, Family Relationships: Family has "pretty much cut me off." Surveyor, quantity / Lack of resources (include bankruptcy): No income Housing / Lack of housing: Homeless, very stressful Physical health (include injuries & life threatening diseases): Mental health issues, no energy. Social relationships: Girlfriend also has substance abuse issues, and is also homeless. Substance abuse: Very stressful - caused a lot of problems in his life. Bereavement / Loss: Denies stressor  Living/Environment/Situation:  Living Arrangements: Other (Comment) Living conditions (as described by patient or guardian): Homeless, together with girlfriend - have stayed in tent and in shelters Who else lives in the home?: WIth girlfriend How long has patient lived in current situation?: 1 year What is atmosphere in current home: Chaotic,Temporary  Family History:  Marital status: Long term relationship Long term relationship, how long?: 3 years What types of issues is patient dealing with in the relationship?: Homelessness, having a hard time getting in contact, girlfriend also drinks Are you sexually active?: Yes What is your sexual orientation?: heterosexual Does patient have children?: No  Childhood History:  By whom was/is the patient raised?: Both parents Description of patient's relationship with caregiver when they were a child: Mother - good; Father - good, did use  alcohol Patient's description of current relationship with people who raised him/her: Mother/Father - still together, estranged because of patient's addiction; spoke to mother briefly 2 weeks ago How were you disciplined when you got in trouble as a child/adolescent?: Spanked Does patient have siblings?: Yes Number of Siblings: 1 Description of patient's current relationship with siblings: Sister - estranged - has not talked to her in years Did patient suffer any verbal/emotional/physical/sexual abuse as a child?: No Did patient suffer from severe childhood neglect?: No Has patient ever been sexually abused/assaulted/raped as an adolescent or adult?: No Was the patient ever a victim of a crime or a disaster?: No Witnessed domestic violence?: No Has patient been affected by domestic violence as an adult?: No  Education:  Highest grade of school patient has completed: Some college Currently a Consulting civil engineer?: No Learning disability?: No  Employment/Work Situation:   Employment situation: Unemployed What is the longest time patient has a held a job?: 7 years 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 Has patient ever been in the [employer]?: No  Financial Resources:   Surveyor, quantity resources: No income Does patient have a Lawyer or guardian?: No  Alcohol/Substance Abuse:   What has been your use of drugs/alcohol within the last 12 months?: Alcohol - daily Alcohol/Substance Abuse Treatment Hx: Past detox,Attends AA/NA If yes, describe treatment: [facility], [facility], rehab at [facility] (cannot return, has been there three times, left early), [facility] (several years ago), [facility] (years ago), [facility] (left early, not too long ago), [facility] (not too long ago, left early) Has alcohol/substance abuse ever caused legal problems?: Yes  Social Support System:   Patient's Community Support System: None Describe Community Support System: Girlfriend is sole  support Type of faith/religion: Christian How does patient's faith help to cope with current illness?: "  I don't know that it does help.  Praying helps sometimes."  Leisure/Recreation:   Do You Have Hobbies?: No  Strengths/Needs:   What is the patient's perception of their strengths?: None Patient states they can use these personal strengths during their treatment to contribute to their recovery: N/A Patient states these barriers may affect/interfere with their treatment: None Patient states these barriers may affect their return to the community: None Other important information patient would like considered in planning for their treatment: None  Discharge Plan:   Currently receiving community mental health services: No Patient states concerns and preferences for aftercare planning are: Needs housing, "if you're trying to stay clean, being on the street is not the thing to do."  Girlfriend trying to go with him.  Cannot return to [facility] Patient states they will know when they are safe and ready for discharge when: Once the withdrawal symptoms stop and am back on anti-depressant medicine, able to get up and be more motivated Does patient have access to transportation?: No Does patient have financial barriers related to discharge medications?: Yes Patient description of barriers related to discharge medications: No income, no insurance Plan for no access to transportation at discharge: Needs to be explored Plan for living situation after discharge: Is thinking he would like to go to [place] or another long-term rehab program.  We talked at length about the possibility of him going somewhere with his girlfriend and how this is a very bad idea and would not be allowed, if known. Will patient be returning to same living situation after discharge?: No  Summary/Recommendations:   Summary and Recommendations (to be completed by the evaluator): Patient is a 40yo male admitted with alcohol withdrawals,  suicidal ideation (with a plan and intent to cut his wrist), three previous suicide attempts, and "major" anxiety.  Stressors include homelessness for the last year, estrangement from parents and sister due to his addiction, unemployment for over a year, and his substance abuse. Patient has been to multiple previous rehabs and cannot return to some of them due to leaving early.  His girlfriend of three years also drinks heavily and they have tried to go into rehab together; he is cautioned strongly against this by CSW.  Being on medication for his mental health can be difficult, since he cannot afford medicine with no income, and since so many rehabs have differing rules about allowing various medicines.  He is thinking about going to [place] again or to an [facility].  Patient will benefit from crisis stabilization, medication evaluation, group therapy and psychoeducation, in addition to case management for discharge planning.  At discharge it is recommended that Patient adhere to the established discharge plan and continue in treatment.  Lynnell Chad. 03/24/2021

## 2021-03-24 NOTE — Progress Notes (Signed)
[facility] MD Progress Note  03/24/2021 10:35 AM Carlos Flowers  MRN:  [id number] Subjective:  "I am not feeling so good today"  Patient was seen this morning with group meeting. He has been admitted for alcohol detox, depression and anxiety. He has no suicide or homicide ideations. He has no hallucinations. He slept about 6 hours last night and report poor appetite and able to drink coffee okay. He has mild sweat and tremors and anxiety. He has low energy and concentration.  He reports being homeless and thinking some thing is going work out between and him and his girl friend and also hoping to participate in rehab and thinking about Burnett Harry, his last re[facility] was at [facility]. He has been contracting for safety.    Principal Problem: MDD (major depressive disorder), recurrent severe, without psychosis (HCC) Diagnosis: Principal Problem:   MDD (major depressive disorder), recurrent severe, without psychosis (HCC)  Total Time spent with patient: 30 minutes  Past Psychiatric History: See H&P  Past Medical History:  Past Medical History:  Diagnosis Date  . Alcoholism (HCC)   . Anxiety   . Depression   . Substance abuse (HCC)    History reviewed. No pertinent surgical history. Family History:  Family History  Problem Relation Age of Onset  . Cancer Maternal Grandmother   . Heart disease Paternal Grandmother   . Heart disease Paternal Grandfather    Family Psychiatric  History: See H&P Social History:  Social History   Substance and Sexual Activity  Alcohol Use Yes  . Alcohol/week: 10.0 - 12.0 standard drinks  . Types: 10 - 12 Cans of beer per week     Social History   Substance and Sexual Activity  Drug Use Not Currently  . Types: Cocaine, Heroin   Comment: last use 12/17/2018    Social History   Socioeconomic History  . Marital status: Single    Spouse name: Not on file  . Number of children: Not on file  . Years of education: Not on file  . Highest education  level: Not on file  Occupational History  . Not on file  Tobacco Use  . Smoking status: Current Every Day Smoker    Packs/day: 2.00    Years: 17.00    Pack years: 34.00    Types: Cigarettes  . Smokeless tobacco: Never Used  Vaping Use  . Vaping Use: Never used  Substance and Sexual Activity  . Alcohol use: Yes    Alcohol/week: 10.0 - 12.0 standard drinks    Types: 10 - 12 Cans of beer per week  . Drug use: Not Currently    Types: Cocaine, Heroin    Comment: last use 12/17/2018  . Sexual activity: Not Currently  Other Topics Concern  . Not on file  Social History Narrative  . Not on file   Social Determinants of Health   Financial Resource Strain: Not on file  Food Insecurity: Not on file  Transportation Needs: Not on file  Physical Activity: Not on file  Stress: Not on file  Social Connections: Not on file   Additional Social History:                         Sleep: Fair  Appetite:  Poor  Current Medications: Current Facility-Administered Medications  Medication Dose Route Frequency Provider Last Rate Last Admin  . acetaminophen (TYLENOL) tablet 650 mg  650 mg Oral Q6H PRN Ardis Hughs, NP  650 mg at 03/24/21 0826  . alum & mag hydroxide-simeth (MAALOX/MYLANTA) 200-200-20 MG/5ML suspension 30 mL  30 mL Oral Q4H PRN Ardis Hughs, NP      . gabapentin (NEURONTIN) capsule 100 mg  100 mg Oral BID Oneta Rack, NP   100 mg at 03/24/21 2229  . hydrOXYzine (ATARAX/VISTARIL) tablet 25 mg  25 mg Oral TID PRN Armandina Stammer I, NP   25 mg at 03/23/21 1136  . loperamide (IMODIUM) capsule 2-4 mg  2-4 mg Oral PRN Claudie Revering, MD      . LORazepam (ATIVAN) tablet 1 mg  1 mg Oral Q6H PRN Claudie Revering, MD   1 mg at 03/24/21 7989  . LORazepam (ATIVAN) tablet 1 mg  1 mg Oral BID Claudie Revering, MD   1 mg at 03/24/21 2119   Followed by  . [START ON 03/26/2021] LORazepam (ATIVAN) tablet 1 mg  1 mg Oral Daily Claudie Revering, MD      . magnesium hydroxide  (MILK OF MAGNESIA) suspension 30 mL  30 mL Oral Daily PRN Ardis Hughs, NP      . multivitamin with minerals tablet 1 tablet  1 tablet Oral Daily Claudie Revering, MD   1 tablet at 03/24/21 [phone]  . nicotine polacrilex (NICORETTE) gum 2 mg  2 mg Oral PRN Claudie Revering, MD   2 mg at 03/24/21 0827  . ondansetron (ZOFRAN-ODT) disintegrating tablet 4 mg  4 mg Oral Q6H PRN Claudie Revering, MD      . thiamine (B-1) injection 100 mg  100 mg Intramuscular Once Claudie Revering, MD      . thiamine tablet 100 mg  100 mg Oral Daily Claudie Revering, MD   100 mg at 03/24/21 0824  . traZODone (DESYREL) tablet 50 mg  50 mg Oral QHS PRN Ardis Hughs, NP   50 mg at 03/23/21 2020    Lab Results:  Results for orders placed or performed during the hospital encounter of 03/22/21 (from the past 48 hour(s))  Lipid panel     Status: Abnormal   Collection Time: 03/24/21  6:50 AM  Result Value Ref Range   Cholesterol 142 0 - 200 mg/dL   Triglycerides 58 <081 mg/dL   HDL 39 (L) >44 mg/dL   Total CHOL/HDL Ratio 3.6 RATIO   VLDL 12 0 - 40 mg/dL   LDL Cholesterol 91 0 - 99 mg/dL    Comment:        Total Cholesterol/HDL:CHD Risk Coronary Heart Disease Risk Table                     Men   Women  1/2 Average Risk   3.4   3.3  Average Risk       5.0   4.4  2 X Average Risk   9.6   7.1  3 X Average Risk  23.4   11.0        Use the calculated Patient Ratio above and the CHD Risk Table to determine the patient's CHD Risk.        ATP III CLASSIFICATION (LDL):  <100     mg/dL   Optimal  818-563  mg/dL   Near or Above                    Optimal  130-159  mg/dL   Borderline  149-702  mg/dL   High  >637  mg/dL   Very High Performed at [facility], [address] [place], [place] [address]   TSH     Status: None   Collection Time: 03/24/21  6:50 AM  Result Value Ref Range   TSH 1.052 0.350 - 4.500 uIU/mL    Comment: Performed by a 3rd Generation assay with a functional  sensitivity of <=0.01 uIU/mL. Performed at [facility], [address] [place], [place] [address]   Hemoglobin A1c     Status: Abnormal   Collection Time: 03/24/21  6:50 AM  Result Value Ref Range   Hgb A1c MFr Bld 5.7 (H) 4.8 - 5.6 %    Comment: (NOTE) Pre diabetes:          5.7%-6.4%  Diabetes:              >6.4%  Glycemic control for   <7.0% adults with diabetes    Mean Plasma Glucose 116.89 mg/dL    Comment: Performed at [facility], [address] [place], [place] [address]    Blood Alcohol level:  Lab Results  Component Value Date   [facility] 03/21/2021   ETH <10 03/16/2021    Metabolic Disorder Labs: Lab Results  Component Value Date   HGBA1C 5.7 (H) 03/24/2021   MPG 116.89 03/24/2021   MPG 111.15 08/08/2020   No results found for: PROLACTIN Lab Results  Component Value Date   CHOL 142 03/24/2021   TRIG 58 03/24/2021   HDL 39 (L) 03/24/2021   CHOLHDL 3.6 03/24/2021   VLDL 12 03/24/2021   LDLCALC 91 03/24/2021   LDLCALC 62 08/08/2020    Physical Findings: AIMS: Facial and Oral Movements Muscles of Facial Expression: None, normal Lips and Perioral Area: None, normal Jaw: None, normal Tongue: None, normal,Extremity Movements Upper (arms, wrists, hands, fingers): None, normal Lower (legs, knees, ankles, toes): None, normal, Trunk Movements Neck, shoulders, hips: None, normal, Overall Severity Severity of abnormal movements (highest score from questions above): None, normal Incapacitation due to abnormal movements: None, normal Patient's awareness of abnormal movements (rate only patient's report): No Awareness, Dental Status Current problems with teeth and/or dentures?: No Does patient usually wear dentures?: No  CIWA:  CIWA-Ar Total: 6 COWS:     Musculoskeletal: Strength & Muscle Tone: within normal limits Gait & Station: normal Patient leans: N/A  Psychiatric Specialty Exam:  Presentation  General Appearance:  Appropriate for Environment; Casual  Eye Contact:Fair  Speech:Clear and Coherent; Normal Rate  Speech Volume:Decreased  Handedness:Right   Mood and Affect  Mood:Anxious; Depressed; Hopeless  Affect:Depressed; Constricted   Thought Process  Thought Processes:Coherent; Goal Directed  Descriptions of Associations:Intact  Orientation:Full (Time, Place and Person)  Thought Content:Rumination  History of Schizophrenia/Schizoaffective disorder:No  Duration of Psychotic Symptoms:No data recorded Hallucinations:Hallucinations: None  Ideas of Reference:None  Suicidal Thoughts:Suicidal Thoughts: Yes, Passive SI Active Intent and/or Plan: Without Intent; Without Plan  Homicidal Thoughts:Homicidal Thoughts: No   Sensorium  Memory:Immediate Good; Remote Good  Judgment:Good  Insight:Good   Executive Functions  Concentration:Fair  Attention Span:Good  Recall:Good  Fund of Knowledge:Good  Language:Good   Psychomotor Activity  Psychomotor Activity:Psychomotor Activity: Decreased   Assets  Assets:Communication Skills; Leisure Time; Desire for Improvement; Financial Resources/Insurance; Resilience   Sleep  Sleep:Sleep: Fair Number of Hours of Sleep: 6    Physical Exam: Physical Exam ROS Blood pressure 120/90, pulse (!) 106, temperature 97.6 F (36.4 C), temperature source Oral, resp. rate 18, height 6' (1.829 m), weight 97.5 kg,  SpO2 96 %. Body mass index is 29.16 kg/m.   Treatment Plan Summary: This is a 40 years old male with alcohol use disorder with multiple rehab treatment and also reports withdrawal.  Alcohol use disorder Alcohol withdrawal - mild today Depression secondary to substance use  Reviewed labs  Continue ativan protocol as ordered and supportive therapy  May start SSRI if needed   Gabapentin 100 mg BID Trazodone 50 mg at bed time  Nicotine gum 2 mg as needed for cravings  Staff monitor for withdrawal seizures  CSW will  work on for possible substance abuse rehab.   Leata Mouse, MD 03/24/2021, 10:35 AM

## 2021-03-25 MED ORDER — PAROXETINE HCL 20 MG PO TABS
20.0000 mg | ORAL_TABLET | Freq: Every day | ORAL | Status: DC
Start: 2021-03-25 — End: 2021-03-27
  Administered 2021-03-25 – 2021-03-27 (×3): 20 mg via ORAL
  Filled 2021-03-25 (×5): qty 1

## 2021-03-25 NOTE — Progress Notes (Signed)
[facility] MD Progress Note  03/25/2021 2:58 PM Carlos Flowers  MRN:  [id number]  Subjective:  "I am not feeling good. I'm still withdrawing from alcohol. I'm still dealing with bad emotion. My depression is at #10 & anxiety #10. I need a rehabilitation treatment program after discharge".  Objective:  Carlos Flowers is a 40 year-old Caucasian male that presents with depression and suicidal ideations, denied plan or intent..  Charted history with major depressive disorder, anxiety, substance abuse alcohol and opiate use/ abuse. Carlos Flowers reports he has been experiencing chronic suicidal ideations for the past 6 months.  States " my family will not have anything to do with me Daily notes: Carlos Flowers is seen in his room. He is lying down in bed. He says he is not feeling good because of bad alcohol withdrawal symptoms. He says he is feeling very depressed & having bad anxiety issues. His is on the CIWA protocols on as needed basis for CIWA >10. He rates his depression & anxiety #10 this morning. His Paxil has been resumed. He says he needs a long term substance abuse treatment program after discharge.  He denies any suicidal/homicidal ideations at this time. He has no hallucinations. He says he slept well last night. He reports being homeless, but thinking about going to [facility] after discharge. He says his last re[facility] was at [facility]. He has been contracting for safety. He is attending group sessions.  Principal Problem: MDD (major depressive disorder), recurrent severe, without psychosis (HCC)  Diagnosis: Principal Problem:   MDD (major depressive disorder), recurrent severe, without psychosis (HCC)  Total Time spent with patient: 15 minutes  Past Psychiatric History: See H&P  Past Medical History:  Past Medical History:  Diagnosis Date  . Alcoholism (HCC)   . Anxiety   . Depression   . Substance abuse (HCC)    History reviewed. No pertinent surgical history. Family History:  Family History   Problem Relation Age of Onset  . Cancer Maternal Grandmother   . Heart disease Paternal Grandmother   . Heart disease Paternal Grandfather    Family Psychiatric  History: See H&P Social History:  Social History   Substance and Sexual Activity  Alcohol Use Yes  . Alcohol/week: 10.0 - 12.0 standard drinks  . Types: 10 - 12 Cans of beer per week     Social History   Substance and Sexual Activity  Drug Use Not Currently  . Types: Cocaine, Heroin   Comment: last use 12/17/2018    Social History   Socioeconomic History  . Marital status: Single    Spouse name: Not on file  . Number of children: Not on file  . Years of education: Not on file  . Highest education level: Not on file  Occupational History  . Not on file  Tobacco Use  . Smoking status: Current Every Day Smoker    Packs/day: 2.00    Years: 17.00    Pack years: 34.00    Types: Cigarettes  . Smokeless tobacco: Never Used  Vaping Use  . Vaping Use: Never used  Substance and Sexual Activity  . Alcohol use: Yes    Alcohol/week: 10.0 - 12.0 standard drinks    Types: 10 - 12 Cans of beer per week  . Drug use: Not Currently    Types: Cocaine, Heroin    Comment: last use 12/17/2018  . Sexual activity: Not Currently  Other Topics Concern  . Not on file  Social History Narrative  .  Not on file   Social Determinants of Health   Financial Resource Strain: Not on file  Food Insecurity: Not on file  Transportation Needs: Not on file  Physical Activity: Not on file  Stress: Not on file  Social Connections: Not on file   Additional Social History:   Sleep: Fair  Appetite:  Poor  Current Medications: Current Facility-Administered Medications  Medication Dose Route Frequency Provider Last Rate Last Admin  . acetaminophen (TYLENOL) tablet 650 mg  650 mg Oral Q6H PRN Ardis Hughs, NP   650 mg at 03/24/21 1700  . alum & mag hydroxide-simeth (MAALOX/MYLANTA) 200-200-20 MG/5ML suspension 30 mL  30 mL Oral  Q4H PRN Ardis Hughs, NP      . gabapentin (NEURONTIN) capsule 100 mg  100 mg Oral BID Oneta Rack, NP   100 mg at 03/25/21 0816  . hydrOXYzine (ATARAX/VISTARIL) tablet 25 mg  25 mg Oral TID PRN Carlos Stammer I, NP   25 mg at 03/25/21 1354  . loperamide (IMODIUM) capsule 2-4 mg  2-4 mg Oral PRN Claudie Revering, MD      . LORazepam (ATIVAN) tablet 1 mg  1 mg Oral Q6H PRN Claudie Revering, MD   1 mg at 03/24/21 2042  . [START ON 03/26/2021] LORazepam (ATIVAN) tablet 1 mg  1 mg Oral Daily Claudie Revering, MD      . magnesium hydroxide (MILK OF MAGNESIA) suspension 30 mL  30 mL Oral Daily PRN Ardis Hughs, NP      . multivitamin with minerals tablet 1 tablet  1 tablet Oral Daily Claudie Revering, MD   1 tablet at 03/25/21 0816  . nicotine polacrilex (NICORETTE) gum 2 mg  2 mg Oral PRN Claudie Revering, MD   2 mg at 03/25/21 1356  . ondansetron (ZOFRAN-ODT) disintegrating tablet 4 mg  4 mg Oral Q6H PRN Claudie Revering, MD      . thiamine (B-1) injection 100 mg  100 mg Intramuscular Once Claudie Revering, MD      . thiamine tablet 100 mg  100 mg Oral Daily Claudie Revering, MD   100 mg at 03/25/21 0816  . traZODone (DESYREL) tablet 50 mg  50 mg Oral QHS PRN Ardis Hughs, NP   50 mg at 03/24/21 2042    Lab Results:  Results for orders placed or performed during the hospital encounter of 03/22/21 (from the past 48 hour(s))  Lipid panel     Status: Abnormal   Collection Time: 03/24/21  6:50 AM  Result Value Ref Range   Cholesterol 142 0 - 200 mg/dL   Triglycerides 58 <998 mg/dL   HDL 39 (L) >33 mg/dL   Total CHOL/HDL Ratio 3.6 RATIO   VLDL 12 0 - 40 mg/dL   LDL Cholesterol 91 0 - 99 mg/dL    Comment:        Total Cholesterol/HDL:CHD Risk Coronary Heart Disease Risk Table                     Men   Women  1/2 Average Risk   3.4   3.3  Average Risk       5.0   4.4  2 X Average Risk   9.6   7.1  3 X Average Risk  23.4   11.0        Use the calculated Patient Ratio above and the  CHD Risk Table to determine the patient's  CHD Risk.        ATP III CLASSIFICATION (LDL):  <100     mg/dL   Optimal  638-756  mg/dL   Near or Above                    Optimal  130-159  mg/dL   Borderline  433-295  mg/dL   High  >188     mg/dL   Very High Performed at [facility], [address] [place], [place] [address]   TSH     Status: None   Collection Time: 03/24/21  6:50 AM  Result Value Ref Range   TSH 1.052 0.350 - 4.500 uIU/mL    Comment: Performed by a 3rd Generation assay with a functional sensitivity of <=0.01 uIU/mL. Performed at [facility], [address], [place], [place] [address]   Hemoglobin A1c     Status: Abnormal   Collection Time: 03/24/21  6:50 AM  Result Value Ref Range   Hgb A1c MFr Bld 5.7 (H) 4.8 - 5.6 %    Comment: (NOTE) Pre diabetes:          5.7%-6.4%  Diabetes:              >6.4%  Glycemic control for   <7.0% adults with diabetes    Mean Plasma Glucose 116.89 mg/dL    Comment: Performed at [facility], [address] [place], [place] [address]    Blood Alcohol level:  Lab Results  Component Value Date   [facility] <10 03/21/2021   ETH <10 03/16/2021    Metabolic Disorder Labs: Lab Results  Component Value Date   HGBA1C 5.7 (H) 03/24/2021   MPG 116.89 03/24/2021   MPG 111.15 08/08/2020   No results found for: PROLACTIN Lab Results  Component Value Date   CHOL 142 03/24/2021   TRIG 58 03/24/2021   HDL 39 (L) 03/24/2021   CHOLHDL 3.6 03/24/2021   VLDL 12 03/24/2021   LDLCALC 91 03/24/2021   LDLCALC 62 08/08/2020    Physical Findings: AIMS: Facial and Oral Movements Muscles of Facial Expression: None, normal Lips and Perioral Area: None, normal Jaw: None, normal Tongue: None, normal,Extremity Movements Upper (arms, wrists, hands, fingers): None, normal Lower (legs, knees, ankles, toes): None, normal, Trunk Movements Neck, shoulders, hips: None, normal, Overall Severity Severity  of abnormal movements (highest score from questions above): None, normal Incapacitation due to abnormal movements: None, normal Patient's awareness of abnormal movements (rate only patient's report): No Awareness, Dental Status Current problems with teeth and/or dentures?: No Does patient usually wear dentures?: No  CIWA:  CIWA-Ar Total: 1 COWS:     Musculoskeletal: Strength & Muscle Tone: within normal limits Gait & Station: normal Patient leans: N/A  Psychiatric Specialty Exam:  Presentation  General Appearance: Appropriate for Environment; Casual  Eye Contact:Fair  Speech:Clear and Coherent; Normal Rate  Speech Volume:Decreased  Handedness:Right   Mood and Affect  Mood:Anxious; Depressed; Hopeless  Affect:Depressed; Constricted  Thought Process  Thought Processes:Coherent; Goal Directed  Descriptions of Associations:Intact  Orientation:Full (Time, Place and Person)  Thought Content:Rumination  History of Schizophrenia/Schizoaffective disorder:No  Duration of Psychotic Symptoms:No data recorded Hallucinations:Hallucinations: None  Ideas of Reference:None  Suicidal Thoughts:Suicidal Thoughts: Yes, Passive SI Active Intent and/or Plan: Without Intent; Without Plan  Homicidal Thoughts:Homicidal Thoughts: No  Sensorium  Memory:Immediate Good; Remote Good  Judgment:Good  Insight:Good  Executive Functions  Concentration:Fair  Attention Span:Good  Recall:Good  Fund of Knowledge:Good  Language:Good  Psychomotor Activity  Psychomotor Activity:Psychomotor Activity: Decreased  Assets  Assets:Communication Skills; Leisure Time; Desire for Improvement; Financial Resources/Insurance; Resilience  Sleep  Sleep:Sleep: Fair Number of Hours of Sleep: 6  Physical Exam: Physical Exam Vitals and nursing note reviewed.  HENT:     Head: Normocephalic.     Mouth/Throat:     Pharynx: Oropharynx is clear.  Eyes:     Pupils: Pupils are equal, round, and  reactive to light.  Cardiovascular:     Rate and Rhythm: Normal rate.     Pulses: Normal pulses.  Pulmonary:     Effort: Pulmonary effort is normal.  Genitourinary:    Comments: Deferred Musculoskeletal:        General: Normal range of motion.     Cervical back: Normal range of motion.  Skin:    General: Skin is warm and dry.  Neurological:     General: No focal deficit present.     Mental Status: He is alert and oriented to person, place, and time.    Review of Systems  Constitutional: Negative.   HENT: Negative.   Eyes: Negative.   Respiratory: Negative.   Cardiovascular: Negative.   Gastrointestinal: Negative.   Genitourinary: Negative.   Musculoskeletal: Negative.   Skin: Negative.   Neurological: Negative.   Psychiatric/Behavioral: Positive for depression and substance abuse (Hx. alcoholism). Negative for hallucinations, memory loss and suicidal ideas. The patient is nervous/anxious and has insomnia.    Blood pressure (!) 129/94, pulse 100, temperature 97.6 F (36.4 C), temperature source Oral, resp. rate 18, height 6' (1.829 m), weight 97.5 kg, SpO2 96 %. Body mass index is 29.16 kg/m.  Treatment Plan/Recommendations: Treatment Plan Summary: Daily contact with patient to assess and evaluate symptoms and progress in treatment and Medication management.  Continue inpatient hospitalization. Will continue today 03/25/2021 plan as below except where it is noted.  Alcohol use disorder Alcohol withdrawal - mild today Depression secondary to substance use  Continue ativan protocol as ordered prn for CIWA >10 for alcohol withdrawal management (Note BAL on admission was <10 & UDS was negative of all substances).  Depression. Resumed Paxil 20 mg po daily.  Agitation/substance withdrawal syndrome. Continue Gabapentin 100 mg po BID. Continue Vistaril 25 mg po tid prn for anxiety  Insomnia. Continue Trazodone 50 mg po prn at bed time  Nicotine gum 2 mg as needed for  cravings. Continue other prn medications for pain/fever, N/V, indigestion etc. Continue thiamine 100 mg po or IM daily for thiamine replacement. Continue multivitamin 1 tablet po daily - vitamin supplementation. Continue MON 30 ml po daily prn for constipation.  Encourage group participation. CSW will work on for possible substance abuse rehab.  Carlos StammerAgnes Cornelio Parkerson, NP, PMHNP, FNP-BC. 03/25/2021, 2:58 PMPatient ID: Carlos Flowers, male   DOB: Jul 07, 1981, 40 y.o.   MRN: [id number]

## 2021-03-25 NOTE — BHH Counselor (Signed)
Clinical Social Work Note  [employer] in [place] was provided to patient, 2 pages long.  He was encouraged to start calling them today, and informed that his discharge is upcoming so it is important for him to be proactive.  Ambrose Mantle, LCSW 03/25/2021, 9:40 AM

## 2021-03-25 NOTE — Progress Notes (Addendum)
D. Pt presents with a sad affect/depressed mood. Pt is calm and cooperative, but isolative-minimal interaction with his peers.Per pt's self inventory, pt rated his depression, hopelessness and anxiety all 10's today.  Pt did not attend group this am, but remained in bed. Pt continues to endorse passive SI with no plan, verbally contracts for safety.   A. Labs and vitals monitored. Pt given and educated on medications. Pt supported emotionally and encouraged to express concerns and ask questions.   R. Pt remains safe with 15 minute checks. Will continue POC.

## 2021-03-25 NOTE — [facility] Group Notes (Signed)
LCSW Group Therapy Note  03/25/2021 10:00am-11:00am  Type of Therapy and Topic:  Group Therapy - Relating to Music to Understand Ourselves  Participation Level:  Did Not Attend   Description of Group This process group involved patients listening to a number of songs, then discussing how their emotions and/or lives relate to said songs.  This brought up patient descriptions of their anxiety, lack of confidence in themselves, acceptance of abuse in their lives, and use of substances to self-medicate.  In general, patients agreed that music can be used as a coping tool to express their feelings, have someone to relate to, and realize they are not alone.  Specifically, the songs played were: Dear Insecurity (about not wanting to continue giving anxiety permission to run one's life) Breaking Down (about making the choice not to continue using substances to deal with problems) You're Not The Only One (about everyone having problems) Warrior (about refusing to continue being other people's victim) I Am Enough (about choosing to look for the good in oneself, instead of only the bad) I Know Where I've Been (about celebrating the progress made so far)  Therapeutic Goals 1. Patient will listen to the songs and be given the opportunity to talk about how they reacted to each 2. Patient will empathize with each other over the pain shared 3. Patient will be given a message of hope 4. Patient will be exposed to the power of music as a coping tool   Summary of Patient Progress:  The patient did not attend.   Therapeutic Modalities Processing Activity   Azhia Siefken J Grossman-Orr, LCSW  

## 2021-03-25 NOTE — Progress Notes (Signed)
[place] NOVEL CORONAVIRUS (COVID-19) DAILY CHECK-OFF SYMPTOMS - answer yes or no to each - every day NO YES  Have you had a fever in the past 24 hours?  . Fever (Temp > 37.80C / 100F) X   Have you had any of these symptoms in the past 24 hours? . New Cough .  Sore Throat  .  Shortness of Breath .  Difficulty Breathing .  Unexplained Body Aches   X   Have you had any one of these symptoms in the past 24 hours not related to allergies?   . Runny Nose .  Nasal Congestion .  Sneezing   X   If you have had runny nose, nasal congestion, sneezing in the past 24 hours, has it worsened?  X   EXPOSURES - check yes or no X   Have you traveled outside the state in the past 14 days?  X   Have you been in contact with someone with a confirmed diagnosis of COVID-19 or PUI in the past 14 days without wearing appropriate PPE?  X   Have you been living in the same home as a person with confirmed diagnosis of COVID-19 or a PUI (household contact)?    X   Have you been diagnosed with COVID-19?    X              What to do next: Answered NO to all: Answered YES to anything:   Proceed with unit schedule Follow the [facility] Inpatient Flowsheet.   

## 2021-03-25 NOTE — Progress Notes (Signed)
DAR NOTE: Pt present with flat affect and depressed mood in the unit. Pt has been isolating himself and has been bed most of the time. Pt denies physical pain, took all his meds as scheduled.  Pt's safety ensured with 15 minute and environmental checks. Pt currently denies SI/HI and A/V hallucinations. Pt verbally agrees to seek staff if SI/HI or A/VH occurs and to consult with staff before acting on these thoughts. Will continue POC.  

## 2021-03-26 NOTE — Progress Notes (Signed)
   03/25/21 2234  Psych Admission Type (Psych Patients Only)  Admission Status Voluntary  Psychosocial Assessment  Patient Complaints Depression  Eye Contact Fair  Facial Expression Anxious;Pensive  Affect Anxious  Speech Press photographer  Appearance/Hygiene In scrubs  Behavior Characteristics Guarded  Mood Depressed  Thought Process  Coherency Concrete thinking  Content Blaming self  Delusions None reported or observed  Perception WDL  Hallucination None reported or observed  Judgment Poor  Confusion None  Danger to Self  Current suicidal ideation? Denies  Self-Injurious Behavior No self-injurious ideation or behavior indicators observed or expressed   Agreement Not to Harm Self Yes  Description of Agreement contracts for safety  Danger to Others  Danger to Others None reported or observed

## 2021-03-26 NOTE — Progress Notes (Signed)
The patient attended group and was appropriate. He had nothing to share.

## 2021-03-26 NOTE — [facility] Counselor (Signed)
CSW informed pt that he has an interview with [facility] on 4/26 at 9am.   Fredirick Lathe, LCSWA Clinicial Social Worker [employer]

## 2021-03-26 NOTE — [facility] Group Notes (Signed)
LCSW Group Therapy Note  03/26/2021   Type of Therapy and Topic:  Group Therapy - Healthy vs Unhealthy Coping Skills  Participation Level: Attended  Description of Group The focus of this group was to determine what unhealthy coping techniques typically are used by group members and what healthy coping techniques would be helpful in coping with various problems. Patients were guided in becoming aware of the differences between healthy and unhealthy coping techniques. Patients were asked to identify 2-3 healthy coping skills they would like to learn to use more effectively.  Therapeutic Goals 1. Patients learned that coping is what human beings do all day long to deal with various situations in their lives 2. Patients defined and discussed healthy vs unhealthy coping techniques 3. Patients identified their preferred coping techniques and identified whether these were healthy or unhealthy 4. Patients determined 2-3 healthy coping skills they would like to become more familiar with and use more often. 5. Patients provided support and ideas to each other   Summary of Patient Progress:  Pt attended group and interacted appropriately with peers.     Therapeutic Modalities Cognitive Behavioral Therapy Motivational Interviewing  Chrys Racer 03/26/2021  1:57 PM

## 2021-03-26 NOTE — [facility] Group Notes (Signed)
Occupational Therapy Group Note Date: 03/26/2021 Group Topic/Focus: Stress Management  Group Description: Group encouraged increased participation and engagement through discussion focused on topic of stress management. Patients engaged interactively to discuss components of stress including physical signs, emotional signs, negative management strategies, and positive management strategies. Each individual identified one new stress management strategy they would like to try moving forward.    Therapeutic Goals: Identify current stressors Identify healthy vs unhealthy stress management strategies/techniques Discuss and identify physical and emotional signs of stress Participation Level: Patient did not attend OT group session despite personal invitation. Pt remained in his bedroom, lying down in bed; denied any needs/concerns at this time.    Plan: Continue to engage patient in OT groups 2 - 3x/week.  03/26/2021  Donne Hazel, MOT, OTR/L

## 2021-03-26 NOTE — Progress Notes (Signed)
[facility] MD Progress Note  03/26/2021 1:43 PM Carlos Flowers  MRN:  [id number]   Subjective: "I still feel like I am withdrawing."  Objective: Medical record reviewed.  I discussed the patient's case in detail with members of the treatment team and nursing staff today.  I met with and evaluated the patient today in the patient's room.  On interview today, the patient states that he feels like he is still having withdrawal symptoms from alcohol withdrawal due to feeling anxious and sweaty.  He describes his mood as depressed and anxious.  The patient reports passive wishes not to be alive but does not report any active intent or plan to harm himself.  He continues to experience low motivation.  He is sleeping okay and appetite is okay.  Patient denies AII, HI, AH, VH or PI.  He denies access to firearms.  The patient states that he is worried about a lot of things such as his housing situation, missing his girlfriend, lack of family support, etc.  Patient reports that he would like to participate in treatment at [facility] in [place] after discharge.  He denies access to firearms.  During our conversation the patient maintains good eye contact.  There is no tremor present and no other motor abnormalities are observed.  Patient is more engaged in conversation than on prior MSE.  Speech is of normal rate volume and amount.  Mood is "depressed".  Affect is constricted, congruent.  Thought processes are coherent and goal-directed.  No delusional thought content is elicited.  Patient denies perceptual abnormalities does not appear to respond to internal stimuli.  He is alert and oriented.  Attention and concentration are grossly intact.  Insight and judgment are fair.  Patient slept 6.25 hours last night.  Vital signs have been stable.  Most recent CIWA score was 3 for anxiety only.  The patient denies side effects from his medications or other physical problems.  The patient is taking standing dose medications as  prescribed.  He received PRN hydroxyzine 3 times yesterday and once this morning for anxiety.  He received PRN lorazepam 1 mg last night at approximately 9 PM.  He took trazodone 50 mg PRN last night for sleep.  Is attending select groups and participating appropriately.   Principal Problem: MDD (major depressive disorder), recurrent severe, without psychosis ([place]) Diagnosis: Principal Problem:   MDD (major depressive disorder), recurrent severe, without psychosis ([place])  Total Time spent with patient: 22 minutes  Past Psychiatric History: See H&P  Past Medical History:  Past Medical History:  Diagnosis Date  . Alcoholism ([place])   . Anxiety   . Depression   . Substance abuse ([place])    History reviewed. No pertinent surgical history. Family History:  Family History  Problem Relation Age of Onset  . Cancer Maternal Grandmother   . Heart disease Paternal Grandmother   . Heart disease Paternal Grandfather    Family Psychiatric  History: See H&P Social History:  Social History   Substance and Sexual Activity  Alcohol Use Yes  . Alcohol/week: 10.0 - 12.0 standard drinks  . Types: 10 - 12 Cans of beer per week     Social History   Substance and Sexual Activity  Drug Use Not Currently  . Types: Cocaine, Heroin   Comment: last use 12/17/2018    Social History   Socioeconomic History  . Marital status: Single    Spouse name: Not on file  . Number of children: Not on file  .  Years of education: Not on file  . Highest education level: Not on file  Occupational History  . Not on file  Tobacco Use  . Smoking status: Current Every Day Smoker    Packs/day: 2.00    Years: 17.00    Pack years: 34.00    Types: Cigarettes  . Smokeless tobacco: Never Used  Vaping Use  . Vaping Use: Never used  Substance and Sexual Activity  . Alcohol use: Yes    Alcohol/week: 10.0 - 12.0 standard drinks    Types: 10 - 12 Cans of beer per week  . Drug use: Not Currently    Types: Cocaine,  Heroin    Comment: last use 12/17/2018  . Sexual activity: Not Currently  Other Topics Concern  . Not on file  Social History Narrative  . Not on file   Social Determinants of Health   Financial Resource Strain: Not on file  Food Insecurity: Not on file  Transportation Needs: Not on file  Physical Activity: Not on file  Stress: Not on file  Social Connections: Not on file   Additional Social History:                         Sleep: Good  Appetite:  Good  Current Medications: Current Facility-Administered Medications  Medication Dose Route Frequency Provider Last Rate Last Admin  . acetaminophen (TYLENOL) tablet 650 mg  650 mg Oral Q6H PRN Revonda Humphrey, NP   650 mg at 03/24/21 1700  . alum & mag hydroxide-simeth (MAALOX/MYLANTA) 200-200-20 MG/5ML suspension 30 mL  30 mL Oral Q4H PRN Revonda Humphrey, NP      . gabapentin (NEURONTIN) capsule 100 mg  100 mg Oral BID Derrill Center, NP   100 mg at 03/26/21 0254  . hydrOXYzine (ATARAX/VISTARIL) tablet 25 mg  25 mg Oral TID PRN Lindell Spar I, NP   25 mg at 03/26/21 0823  . magnesium hydroxide (MILK OF MAGNESIA) suspension 30 mL  30 mL Oral Daily PRN Revonda Humphrey, NP      . multivitamin with minerals tablet 1 tablet  1 tablet Oral Daily Arthor Captain, MD   1 tablet at 03/26/21 2706  . nicotine polacrilex (NICORETTE) gum 2 mg  2 mg Oral PRN Arthor Captain, MD   2 mg at 03/26/21 2376  . PARoxetine (PAXIL) tablet 20 mg  20 mg Oral Daily Lindell Spar I, NP   20 mg at 03/26/21 0823  . thiamine (B-1) injection 100 mg  100 mg Intramuscular Once Arthor Captain, MD      . thiamine tablet 100 mg  100 mg Oral Daily Arthor Captain, MD   100 mg at 03/26/21 2831  . traZODone (DESYREL) tablet 50 mg  50 mg Oral QHS PRN Revonda Humphrey, NP   50 mg at 03/25/21 2110    Lab Results: No results found for this or any previous visit (from the past 48 hour(s)).  Blood Alcohol level:  Lab Results  Component Value Date    ETH <10 03/21/2021   ETH <10 51/76/1607    Metabolic Disorder Labs: Lab Results  Component Value Date   HGBA1C 5.7 (H) 03/24/2021   MPG 116.89 03/24/2021   MPG 111.15 08/08/2020   No results found for: PROLACTIN Lab Results  Component Value Date   CHOL 142 03/24/2021   TRIG 58 03/24/2021   HDL 39 (L) 03/24/2021   CHOLHDL 3.6 03/24/2021  VLDL 12 03/24/2021   LDLCALC 91 03/24/2021   LDLCALC 62 08/08/2020    Physical Findings: AIMS: Facial and Oral Movements Muscles of Facial Expression: None, normal Lips and Perioral Area: None, normal Jaw: None, normal Tongue: None, normal,Extremity Movements Upper (arms, wrists, hands, fingers): None, normal Lower (legs, knees, ankles, toes): None, normal, Trunk Movements Neck, shoulders, hips: None, normal, Overall Severity Severity of abnormal movements (highest score from questions above): None, normal Incapacitation due to abnormal movements: None, normal Patient's awareness of abnormal movements (rate only patient's report): No Awareness, Dental Status Current problems with teeth and/or dentures?: No Does patient usually wear dentures?: No  CIWA:  CIWA-Ar Total: 3 COWS:     Musculoskeletal: Strength & Muscle Tone: within normal limits Gait & Station: normal Patient leans: N/A  Psychiatric Specialty Exam:  Presentation  General Appearance: Appropriate for Environment; Casual  Eye Contact:Good  Speech:Clear and Coherent; Normal Rate  Speech Volume:Normal  Handedness:Right   Mood and Affect  Mood:Depressed; Anxious  Affect:Congruent; Constricted   Thought Process  Thought Processes:Coherent; Goal Directed; Linear  Descriptions of Associations:Intact  Orientation:Full (Time, Place and Person)  Thought Content:Rumination  History of Schizophrenia/Schizoaffective disorder:No  Duration of Psychotic Symptoms:No data recorded Hallucinations:Hallucinations: None  Ideas of Reference:None  Suicidal  Thoughts:Suicidal Thoughts: Yes, Passive SI Passive Intent and/or Plan: Without Intent; Without Plan  Homicidal Thoughts:Homicidal Thoughts: No   Sensorium  Memory:Immediate Good; Remote Good  Judgment:Fair  Insight:Fair   Executive Functions  Concentration:Good  Attention Span:Good  Recall:Good  Fund of Knowledge:Good  Language:Good   Psychomotor Activity  Psychomotor Activity:Psychomotor Activity: Normal   Assets  Assets:Communication Skills; Desire for Improvement; Resilience; Financial Resources/Insurance   Sleep  Sleep:Sleep: Good    Physical Exam: Physical Exam Vitals and nursing note reviewed.  HENT:     Head: Normocephalic and atraumatic.  Neurological:     General: No focal deficit present.     Mental Status: He is alert and oriented to person, place, and time.    Review of Systems  Constitutional: Positive for diaphoresis. Negative for fever.       Positive for sweats per patient report.  No diaphoresis noted on exam.  HENT: Negative for hearing loss.   Eyes: Negative for blurred vision.  Respiratory: Negative for cough and shortness of breath.   Cardiovascular: Negative for chest pain.  Gastrointestinal: Negative for nausea and vomiting.  Genitourinary: Negative for dysuria.  Musculoskeletal: Negative for joint pain and myalgias.  Skin: Negative for rash.  Neurological: Negative for dizziness, tremors and headaches.  Psychiatric/Behavioral: Positive for depression, substance abuse and suicidal ideas. Negative for hallucinations. The patient is nervous/anxious. The patient does not have insomnia.    Blood pressure 109/78, pulse 86, temperature 97.6 F (36.4 C), temperature source Oral, resp. rate 18, height 6' (1.829 m), weight 97.5 kg, SpO2 96 %. Body mass index is 29.16 kg/m.   Treatment Plan Summary: Patient is a 40 year old male with a history of depression, anxiety and alcohol use disorder who was admitted with worsening symptoms of  depression, anxiety and suicidal ideation with a plan to cut his wrist in the context of ongoing alcohol use and homelessness.  The patient is tolerating treatment with his antidepressant.  He is not displaying any signs of alcohol withdrawal and his last drink was on 03/19/2021. Social work is attempting to assist patient in accessing residential treatment program for alcohol use disorder so patient may be discharged directly from inpatient setting to residential treatment program.  Anticipate probable discharge  within the next 1 to 2 days.   Daily contact with patient to assess and evaluate symptoms and progress in treatment and Medication management   Continue every 15-minute observation status  Alcohol use disorder -Discontinue CIWA protocol as patient has now been 7 days since his last drink, vital signs are stable and patient is not displaying any signs of alcohol withdrawal. -Advised patient to participate in residential treatment program for alcohol use disorder  Depression/anxiety -Continue Paxil 20 mg daily for depression and anxiety -Continue hydroxyzine 25 mg 3 times daily PRN anxiety  Insomnia -Continue trazodone 50 mg nightly PRN insomnia  Social work is working on aftercare plans including attempting to assist patient in accessing bed in residential substance abuse treatment program  Anticipate probable discharge in 1 to 2 days.  I certify that inpatient services furnished can reasonably be expected to improve the patient's condition.  Arthor Captain, MD 03/26/2021, 1:43 PM

## 2021-03-26 NOTE — [facility] Group Notes (Signed)
The focus of this group is to help patients establish daily goals to achieve during treatment and discuss how the patient can incorporate goal setting into their daily lives to aide in recovery.  Pt did not attend group 

## 2021-03-26 NOTE — Progress Notes (Signed)
Pt endorses passive SI.  Says he has no plan in place, but "wishes I wasn't here anymore."  Pt reports having anxiety as well.  Given scheduled ativan this morning and prn vistaril per pt's request.  Pt asks for nicotine gum which helps "calm my nerves."  RN established rapport with pt and assessed for needs and concerns. RN administered medications per MD orders. Pt is calm at this time.  Pt remains safe on the unit with q 15 min checks in place.

## 2021-03-26 NOTE — Progress Notes (Signed)
Recreation Therapy Notes  Date: 4.25.22 Time: 0930 Location: [address] Dayroom  Group Topic: Stress Management  Goal Area(s) Addresses:  Patient will identify positive stress management techniques. Patient will identify benefits of using stress management post d/c.  Intervention: Stress Management  Activity: Meditation.  LRT played a meditation that focused on making the most of your day and putting an emphasis on things they wish to accomplish throughout the day.    Education:  Stress Management, Discharge Planning.   Education Outcome: Acknowledges Education  Clinical Observations/Feedback:  Pt did not attend group session.    Daesean Lazarz, LRT/CTRS         Keeana Pieratt A 03/26/2021 12:00 PM 

## 2021-03-27 MED ORDER — GABAPENTIN 100 MG PO CAPS: 100.0000 mg | ORAL_CAPSULE | Freq: Two times a day (BID) | ORAL | 0 refills | Status: DC

## 2021-03-27 MED ORDER — HYDROXYZINE HCL 25 MG PO TABS: 25.0000 mg | ORAL_TABLET | Freq: Three times a day (TID) | ORAL | 0 refills | Status: DC | PRN

## 2021-03-27 MED ORDER — TRAZODONE HCL 50 MG PO TABS: 50.0000 mg | ORAL_TABLET | Freq: Every evening | ORAL | 0 refills | Status: DC | PRN

## 2021-03-27 MED ORDER — PAROXETINE HCL 20 MG PO TABS: 20.0000 mg | ORAL_TABLET | Freq: Every day | ORAL | 0 refills | Status: DC

## 2021-03-27 NOTE — Progress Notes (Signed)
  [facility] Adult Case Management Discharge Plan :  Will you be returning to the same living situation after discharge:  No. girlfriend's family in [place] At discharge, do you have transportation home?: No. Safe Transport will be arranged Do you have the ability to pay for your medications: No. given resources  Release of information consent forms completed and in the chart;  Patient's signature needed at discharge.  Patient to Follow up at:  Follow-up Information    [facility] Follow up.   Specialty: Behavioral Health Why: You have an appointment for therapy and medication management on 5/4 at 7:45am. Please ask for substance use resources at this appointment if you are interested.  Contact information: [address] [place] [address] [phone]              Next level of care provider has access to [facility]:yes  Safety Planning and Suicide Prevention discussed: Yes,  w/ pt  Have you used any form of tobacco in the last 30 days? (Cigarettes, Smokeless Tobacco, Cigars, and/or Pipes): Yes  Has patient been referred to the Quitline?: Patient refused referral  Patient has been referred for addiction treatment: Pt. refused referral  Felizardo Hoffmann, LCSWA 03/27/2021, 9:09 AM

## 2021-03-27 NOTE — [facility] Suicide Risk Assessment (Signed)
[facility] INPATIENT:  Family/Significant Other Suicide Prevention Education  Suicide Prevention Education:  Contact Attempts: girlfriend [name] [phone] , (name of family member/significant other) has been identified by the patient as the family member/significant other with whom the patient will be residing, and identified as the person(s) who will aid the patient in the event of a mental health crisis.  With written consent from the patient, two attempts were made to provide suicide prevention education, prior to and/or following the patient's discharge.  We were unsuccessful in providing suicide prevention education.  A suicide education pamphlet was given to the patient to share with family/significant other.  Date and time of first attempt: 4/25   10am Date and time of second attempt:  4/26  9am  CSW completed safety planning with pt.   Carlos Flowers 03/27/2021, 9:00 AM

## 2021-03-27 NOTE — Discharge Summary (Signed)
Physician Discharge Summary Note  Patient:  Carlos Flowers is an 40 y.o., male MRN:  [id number] DOB:  Sep 08, 1981 Patient phone:  [phone] (home)  Patient address:   [address] [place] [address],  Total Time spent with patient: 30 minutes  Date of Admission:  03/22/2021 Date of Discharge: 03/27/2021  Reason for Admission:  (From MD's admission note): Carlos Flowers is a 40 year-old Caucasian male that presents with depression and suicidal ideations, denied plan or intent..  Charted history with major depressive disorder, anxiety, substance abuse alcohol and opiate use/ abuse.   Carlos Flowers reports he has been experiencing chronic suicidal ideations for the past 6 months.  States " my family will not have anything to do with me."  Denies that he is currently employed. Stated that he is currently homeless and is unable to stop drink alcohol.  Patient reports intended multiple rehabilitation facilities in the past.  He states he was recently discharged from [facility] few weeks prior.  States he [facility],but stated that he discharge hisself.   Reported family history of mental illness: Mother: Anxiety depression.  Carlos Flowers Leriche is the maximum that it can states " only thoughts".  Reports worthlessness, depression, decreased concentration and anxiety.  Denies taking medications as directed.  States he is only prescribed gabapentin 300 mg 4 times a day.  States last having medication was 1 week prior.  Reports a decreased appetite.  Denies insomnia or insomnia symptoms.  Patient to be admitted to inpatient for mood stabilization.   Evaluation on the unit, day of discharge: Patient was seen and evaluated. He denies SI/HI/AVH, paranoia and delusions. He is calm and cooperative. Patient is taking his medications without any issues. He is eating and sleeping well. He is attending group therapy and interacting appropriately with staff and peers. He has follow up appointments  as listed in his discharge paperwork. He was provided with sample medications due to lack of insurance and has paper prescriptions to present to [facility] who will assist him with his medical and mental health needs. He will be staying with his girlfriend's family in [place]. His vital signs are stable, he is afebrile. He had no new labs today. Patient is stable for discharge home today.   Principal Problem: MDD (major depressive disorder), recurrent severe, without psychosis (HCC) Discharge Diagnoses: Principal Problem:   MDD (major depressive disorder), recurrent severe, without psychosis (HCC)   Past Psychiatric History: See H&P  Past Medical History:  Past Medical History:  Diagnosis Date  . Alcoholism (HCC)   . Anxiety   . Depression   . Substance abuse (HCC)    History reviewed. No pertinent surgical history. Family History:  Family History  Problem Relation Age of Onset  . Cancer Maternal Grandmother   . Heart disease Paternal Grandmother   . Heart disease Paternal Grandfather    Family Psychiatric  History: See H&P Social History:  Social History   Substance and Sexual Activity  Alcohol Use Yes  . Alcohol/week: 10.0 - 12.0 standard drinks  . Types: 10 - 12 Cans of beer per week     Social History   Substance and Sexual Activity  Drug Use Not Currently  . Types: Cocaine, Heroin   Comment: last use 12/17/2018    Social History   Socioeconomic History  . Marital status: Single    Spouse name: Not on file  . Number of children: Not on file  . Years of education:  Not on file  . Highest education level: Not on file  Occupational History  . Not on file  Tobacco Use  . Smoking status: Current Every Day Smoker    Packs/day: 2.00    Years: 17.00    Pack years: 34.00    Types: Cigarettes  . Smokeless tobacco: Never Used  Vaping Use  . Vaping Use: Never used  Substance and Sexual Activity  . Alcohol use: Yes    Alcohol/week: 10.0 -  12.0 standard drinks    Types: 10 - 12 Cans of beer per week  . Drug use: Not Currently    Types: Cocaine, Heroin    Comment: last use 12/17/2018  . Sexual activity: Not Currently  Other Topics Concern  . Not on file  Social History Narrative  . Not on file   Social Determinants of Health   Financial Resource Strain: Not on file  Food Insecurity: Not on file  Transportation Needs: Not on file  Physical Activity: Not on file  Stress: Not on file  Social Connections: Not on file    Hospital Course: Carlos Flowers is a 40 year old male  that presents with depression and suicidal ideations, denied plan or intent..  Charted history with major depressive disorder, anxiety, substance abuse alcohol and opiate use/ abuse. He was admitted to the 300 hall for stabilization and medication management. .    After the above admission evaluation, Carlos Flowers's presenting symptoms were noted. He was recommended for mood stabilization treatments. The medication regimen targeting those presenting symptoms were discussed with him & initiated with his consent. He was started on Neurontin for alcohol withdrawal syndrome and his Paxil was continued to treat his depression. He received Vistaril PRN for anxiety and Trazodone PRN for sleep.  and His UDS and BAL on arrival to the ED were negative. Due to his self-report of alcohol abuse, he was placed on a CIWA alcohol withdrawal protocol and medicated according to the protocol guidelines. He did complain of some withdrawal symptoms, such as feeling sweaty and shaky. He denied these symptoms today at discharge.  He was however medicated, stabilized & discharged on the medications as listed on his discharge medication list below. Besides the mood stabilization treatments, Carlos Flowers was also enrolled & participated in the group counseling sessions being offered & held on this unit. He learned coping skills. He presented no other significant pre-existing medical issues that  required treatment. He tolerated his treatment regimen without any adverse effects or reactions reported.   During the course of his hospitalization, the 15-minute checks were adequate to ensure patient's safety. Pesqueira did not display any dangerous, violent or suicidal behavior on the unit.  He interacted with patients & staff appropriately, participated appropriately in the group sessions/therapies. His medications were addressed & adjusted to meet his needs. He was recommended for outpatient follow-up care & medication management upon discharge to assure continuity of care & mood stability.  At the time of discharge patient is not reporting any acute suicidal/homicidal ideations. He feels more confident about his/her self-care & in managing his mental health. He currently denies any new issues or concerns. Education and supportive counseling provided throughout his hospital stay & upon discharge.   Today upon his discharge evaluation with the attending psychiatrist, Rosenberg shares he is doing well. He denies any other specific concerns. He is sleeping well. His appetite is good. He denies other physical complaints. He denies AH/VH, delusional thoughts or paranoia. He does not appear to be responding to  any internal stimuli. He feels that his medications have been helpful & is in agreement to continue his current treatment regimen as recommended. He was able to engage in safety planning including plan to return to [facility] or contact emergency services if he feels unable to maintain his own safety or the safety of others. Pt had no further questions, comments, or concerns. He left [facility] with all personal belongings in no apparent distress. Transportation per [employer].   Physical Findings: AIMS: Facial and Oral Movements Muscles of Facial Expression: None, normal Lips and Perioral Area: None, normal Jaw: None, normal Tongue: None, normal,Extremity Movements Upper (arms, wrists, hands, fingers): None,  normal Lower (legs, knees, ankles, toes): None, normal, Trunk Movements Neck, shoulders, hips: None, normal, Overall Severity Severity of abnormal movements (highest score from questions above): None, normal Incapacitation due to abnormal movements: None, normal Patient's awareness of abnormal movements (rate only patient's report): No Awareness, Dental Status Current problems with teeth and/or dentures?: No Does patient usually wear dentures?: No  CIWA:  CIWA-Ar Total: 0 COWS:     Musculoskeletal: Strength & Muscle Tone: within normal limits Gait & Station: normal Patient leans: N/A  Psychiatric Specialty Exam:  Presentation  General Appearance: Appropriate for Environment; Casual; Fairly Groomed  Eye Contact:Good  Speech:Clear and Coherent; Normal Rate  Speech Volume:Normal  Handedness:Right   Mood and Affect  Mood:Euthymic  Affect:Congruent; Appropriate  Thought Process  Thought Processes:Coherent; Goal Directed; Linear  Descriptions of Associations:Intact  Orientation:Full (Time, Place and Person)  Thought Content:Logical  History of Schizophrenia/Schizoaffective disorder:No  Duration of Psychotic Symptoms:No data recorded Hallucinations:Hallucinations: None  Ideas of Reference:None  Suicidal Thoughts:Suicidal Thoughts: No  Homicidal Thoughts:Homicidal Thoughts: No  Sensorium  Memory:Immediate Good; Remote Good; Recent Good  Judgment:Fair  Insight:Fair  Executive Functions  Concentration:Good  Attention Span:Good  Recall:Good  Fund of Knowledge:Good  Language:Good  Psychomotor Activity  Psychomotor Activity:Psychomotor Activity: Normal  Assets  Assets:Communication Skills; Desire for Improvement; Resilience; Financial Resources/Insurance; Housing; Social Support  Sleep  Sleep:Sleep: Good  Physical Exam: Physical Exam Vitals and nursing note reviewed.  Constitutional:      Appearance: Normal appearance.  HENT:     Head:  Normocephalic.  Pulmonary:     Effort: Pulmonary effort is normal.  Musculoskeletal:        General: Normal range of motion.     Cervical back: Normal range of motion.  Neurological:     Mental Status: He is alert and oriented to person, place, and time.  Psychiatric:        Attention and Perception: Attention normal. He does not perceive auditory or visual hallucinations.        Mood and Affect: Mood normal.        Speech: Speech normal.        Behavior: Behavior normal. Behavior is cooperative.        Thought Content: Thought content normal. Thought content is not paranoid or delusional. Thought content does not include homicidal or suicidal ideation. Thought content does not include homicidal or suicidal plan.        Cognition and Memory: Cognition normal.    Review of Systems  Constitutional: Negative.  Negative for fever.  HENT: Negative.  Negative for congestion and sore throat.   Respiratory: Negative.  Negative for cough and shortness of breath.   Cardiovascular: Negative.  Negative for chest pain.  Gastrointestinal: Negative.   Genitourinary: Negative.   Musculoskeletal: Negative.   Neurological: Negative.    Blood pressure (!) 126/95, pulse  92, temperature 97.8 F (36.6 C), temperature source Oral, resp. rate 18, height 6' (1.829 m), weight 97.5 kg, SpO2 97 %. Body mass index is 29.16 kg/m.   Have you used any form of tobacco in the last 30 days? (Cigarettes, Smokeless Tobacco, Cigars, and/or Pipes): Yes  Has this patient used any form of tobacco in the last 30 days? (Cigarettes, Smokeless Tobacco, Cigars, and/or Pipes) Yes, Yes, A prescription for an FDA-approved tobacco cessation medication was offered at discharge and the patient refused  Blood Alcohol level:  Lab Results  Component Value Date   [facility] <10 03/21/2021   ETH <10 03/16/2021    Metabolic Disorder Labs:  Lab Results  Component Value Date   HGBA1C 5.7 (H) 03/24/2021   MPG 116.89 03/24/2021   MPG  111.15 08/08/2020   No results found for: PROLACTIN Lab Results  Component Value Date   CHOL 142 03/24/2021   TRIG 58 03/24/2021   HDL 39 (L) 03/24/2021   CHOLHDL 3.6 03/24/2021   VLDL 12 03/24/2021   LDLCALC 91 03/24/2021   LDLCALC 62 08/08/2020    See Psychiatric Specialty Exam and Suicide Risk Assessment completed by Attending Physician prior to discharge.  Discharge destination:  Other:  Girlfriend's house  Is patient on multiple antipsychotic therapies at discharge:  No   Has Patient had three or more failed trials of antipsychotic monotherapy by history:  No  Recommended Plan for Multiple Antipsychotic Therapies: NA  Discharge Instructions    Diet - low sodium heart healthy   Complete by: As directed    Increase activity slowly   Complete by: As directed      Allergies as of 03/27/2021      Reactions   Peanut-containing Drug Products Anaphylaxis      Medication List    STOP taking these medications   chlordiazePOXIDE 25 MG capsule Commonly known as: LIBRIUM   cloNIDine 0.1 MG tablet Commonly known as: CATAPRES   gabapentin 600 MG tablet Commonly known as: NEURONTIN Replaced by: gabapentin 100 MG capsule     TAKE these medications     Indication  gabapentin 100 MG capsule Commonly known as: NEURONTIN Take 1 capsule (100 mg total) by mouth 2 (two) times daily. Replaces: gabapentin 600 MG tablet  Indication: Abuse or Misuse of Alcohol, Alcohol Withdrawal Syndrome   hydrOXYzine 25 MG tablet Commonly known as: ATARAX/VISTARIL Take 1 tablet (25 mg total) by mouth 3 (three) times daily as needed for anxiety. What changed: when to take this  Indication: Feeling Anxious   PARoxetine 20 MG tablet Commonly known as: PAXIL Take 1 tablet (20 mg total) by mouth daily.  Indication: Major Depressive Disorder   traZODone 50 MG tablet Commonly known as: DESYREL Take 1 tablet (50 mg total) by mouth at bedtime as needed for sleep.  Indication: Trouble  Sleeping       Follow-up Information    [facility] Follow up.   Specialty: Behavioral Health Why: You have an appointment for therapy and medication management on 5/4 at 7:45am. Please ask for substance use resources at this appointment if you are interested.  Contact information: [address] [place] [address] [phone]       [facility] Follow up.   Why: You may go here to receive reduced cost/free prescriptions as well as primary care services.  Contact information: [address] [place] [phone]  Follow-up recommendations:  Activity:  as tolerated Diet:  heart healthy  Comments:  Prescriptions were given at discharge.  Patient received samples of his medications, due to lack of insurance,  to bridge him until he can [facility] who will assist him with his medical and mental health needs.  Patient is agreeable to the discharge plan.  He was given opportunity to ask questions.  He appears to feel comfortable with discharge denies any current suicidal or homicidal thoughts.   Patient is instructed prior to discharge to: Take all medications as prescribed by his mental healthcare provider. Report any adverse effects and or reactions from the medicines to his outpatient provider promptly. Patient has been instructed & cautioned: To not engage in alcohol and or illegal drug use while on prescription medicines. In the event of worsening symptoms, patient is instructed to call the crisis hotline, 911 and or go to the nearest ED for appropriate evaluation and treatment of symptoms. To follow-up with his primary care provider for your other medical issues, concerns and or health care needs.   Signed: Laveda AbbeLaurie Britton Benicio Manna, NP 03/27/2021, 12:03 PM

## 2021-03-27 NOTE — Progress Notes (Signed)
Patient was cooperative with treatment, he remains sad and depressed. He denies suicidal ideation and he denies avh. He was compliant with medications.. He appears to be resting in bed quietly at this time.

## 2021-03-27 NOTE — BHH Suicide Risk Assessment (Signed)
[facility] Discharge Suicide Risk Assessment   Principal Problem: MDD (major depressive disorder), recurrent severe, without psychosis (HCC) Discharge Diagnoses: Principal Problem:   MDD (major depressive disorder), recurrent severe, without psychosis (HCC)   Total Time spent with patient: 18 minutes  Musculoskeletal: Strength & Muscle Tone: within normal limits Gait & Station: normal Patient leans: N/A  Psychiatric Specialty Exam: Review of Systems  Constitutional: Negative for fever.  HENT: Negative for sore throat.   Eyes: Negative for discharge.  Respiratory: Negative for cough and shortness of breath.   Cardiovascular: Negative for chest pain.  Gastrointestinal: Negative for nausea and vomiting.  Genitourinary: Negative for difficulty urinating.  Musculoskeletal: Negative for arthralgias.  Neurological: Negative for dizziness and tremors.  Psychiatric/Behavioral: Negative for dysphoric mood, hallucinations, self-injury, sleep disturbance and suicidal ideas.    Blood pressure (!) 126/95, pulse 92, temperature 97.8 F (36.6 C), temperature source Oral, resp. rate 18, height 6' (1.829 m), weight 97.5 kg, SpO2 97 %.Body mass index is 29.16 kg/m.  General Appearance: Casual and Fairly Groomed  Eye Contact::  Good  Speech:  Clear and Coherent and Normal Rate  Volume:  Normal  Mood:  "Okay.  A little anxious"  Affect:  Appropriate, Congruent and Stable  Thought Process:  Coherent, Goal Directed and Linear  Orientation:  Full (Time, Place, and Person)  Thought Content:  Logical  Suicidal Thoughts:  No  Homicidal Thoughts:  No  Memory:  Immediate;   Good Recent;   Good Remote;   Good  Judgement:  Fair  Insight:  Fair  Psychomotor Activity:  Normal  Concentration:  Good  Recall:  Good  Fund of Knowledge:Good  Language: Good  Akathisia:  No    AIMS (if indicated):     Assets:  Communication Skills Desire for Improvement Housing Resilience Social Support  Sleep:  Number  of Hours: 6.75  Cognition: WNL  ADL's:  Intact   Mental Status Per Nursing Assessment::   On Admission:  Suicidal ideation indicated by patient,Plan includes specific time, place, or method,Intention to act on suicide plan,Self-harm thoughts,Suicide plan  Demographic Factors:  Male, Caucasian, Low socioeconomic status and Unemployed  Loss Factors: Financial problems/change in socioeconomic status  Historical Factors: Family history of mental illness or substance abuse and Impulsivity  Risk Reduction Factors:   Living with another person, especially a relative and Positive social support  Continued Clinical Symptoms:  Depression - improved Alcohol/Substance Abuse/Dependencies More than one psychiatric diagnosis Previous Psychiatric Diagnoses and Treatments  Cognitive Features That Contribute To Risk:  None    Suicide Risk:  Mild:  Suicidal ideation of limited frequency, intensity, duration, and specificity.  There are no identifiable plans, no associated intent, mild dysphoria and related symptoms, good self-control (both objective and subjective assessment), few other risk factors, and identifiable protective factors, including available and accessible social support.   Follow-up Information    [facility] Follow up.   Specialty: Behavioral Health Why: You have an appointment for therapy and medication management on 5/4 at 7:45am. Please ask for substance use resources at this appointment if you are interested.  Contact information: [address] [place] [address] [phone]              Plan Of Care/Follow-up recommendations:  Activity:  as tolerated  Other:   - Take medications as prescribed.   - Attend residential or intensive outpatient substance abuse treatment program.   - Attend 12-step group and get sponsor. - Do not drink alcohol.  Do not use marijuana or other drugs.   - Structure time to stay busy - Keep  follow up appointments with outpatient psychiatrist and outpatient therapist.   - See primary care provider for follow up of any medical issues.  Claudie Revering, MD 03/27/2021, 8:11 AM

## 2021-03-27 NOTE — Progress Notes (Cosign Needed)
Adult Psychoeducational Group Note  Date:  03/27/2021 Time:  10:22 AM  Group Topic/Focus:  Goals Group:   The focus of this group is to help patients establish daily goals to achieve during treatment and discuss how the patient can incorporate goal setting into their daily lives to aide in recovery.  Participation Level:  Active  Participation Quality:  Appropriate  Affect:  Appropriate  Cognitive:  Alert  Insight: Appropriate  Engagement in Group:  Engaged  Modes of Intervention:  Discussion  Additional Comments:  Pt attended group and was engaged in discussion.  Tashana Haberl R Quandra Fedorchak 03/27/2021, 10:22 AM

## 2021-03-27 NOTE — BHH Counselor (Signed)
Pt has decided to no longer pursue TROSA.  Fredirick Lathe, LCSWA Clinicial Social Worker [employer]

## 2021-03-27 NOTE — Plan of Care (Signed)
Discharge note  Patient verbalizes readiness for discharge. Follow up plan explained, AVS, Transition record and SRA given. Prescriptions and teaching provided. Belongings returned and signed for. Suicide safety plan completed and signed. Patient verbalizes understanding. Patient denies SI/HI and assures this Clinical research associate they will seek assistance should that change. Patient discharged to lobby to wait for safe transport.  Problem: Education: Goal: Ability to state activities that reduce stress will improve Outcome: Adequate for Discharge   Problem: Coping: Goal: Ability to identify and develop effective coping behavior will improve Outcome: Adequate for Discharge   Problem: Self-Concept: Goal: Ability to identify factors that promote anxiety will improve Outcome: Adequate for Discharge Goal: Level of anxiety will decrease Outcome: Adequate for Discharge Goal: Ability to modify response to factors that promote anxiety will improve 03/27/2021 1158 by Raylene Miyamoto, RN Outcome: Adequate for Discharge 03/27/2021 1194 by Raylene Miyamoto, RN Outcome: Progressing   Problem: Education: Goal: Utilization of techniques to improve thought processes will improve 03/27/2021 1158 by Raylene Miyamoto, RN Outcome: Adequate for Discharge 03/27/2021 1740 by Raylene Miyamoto, RN Outcome: Progressing Goal: Knowledge of the prescribed therapeutic regimen will improve 03/27/2021 1158 by Raylene Miyamoto, RN Outcome: Adequate for Discharge 03/27/2021 8144 by Raylene Miyamoto, RN Outcome: Progressing   Problem: Activity: Goal: Interest or engagement in leisure activities will improve Outcome: Adequate for Discharge Goal: Imbalance in normal sleep/wake cycle will improve Outcome: Adequate for Discharge   Problem: Coping: Goal: Coping ability will improve Outcome: Adequate for Discharge Goal: Will verbalize feelings Outcome: Adequate for Discharge   Problem: Health Behavior/Discharge  Planning: Goal: Ability to make decisions will improve Outcome: Adequate for Discharge Goal: Compliance with therapeutic regimen will improve Outcome: Adequate for Discharge   Problem: Role Relationship: Goal: Will demonstrate positive changes in social behaviors and relationships Outcome: Adequate for Discharge   Problem: Safety: Goal: Ability to disclose and discuss suicidal ideas will improve Outcome: Adequate for Discharge Goal: Ability to identify and utilize support systems that promote safety will improve Outcome: Adequate for Discharge   Problem: Self-Concept: Goal: Will verbalize positive feelings about self Outcome: Adequate for Discharge Goal: Level of anxiety will decrease Outcome: Adequate for Discharge   Problem: Education: Goal: Knowledge of [facility] Education information/materials will improve Outcome: Adequate for Discharge Goal: Emotional status will improve Outcome: Adequate for Discharge Goal: Mental status will improve Outcome: Adequate for Discharge Goal: Verbalization of understanding the information provided will improve Outcome: Adequate for Discharge   Problem: Activity: Goal: Interest or engagement in activities will improve Outcome: Adequate for Discharge Goal: Sleeping patterns will improve Outcome: Adequate for Discharge   Problem: Coping: Goal: Ability to verbalize frustrations and anger appropriately will improve Outcome: Adequate for Discharge Goal: Ability to demonstrate self-control will improve Outcome: Adequate for Discharge   Problem: Health Behavior/Discharge Planning: Goal: Identification of resources available to assist in meeting health care needs will improve Outcome: Adequate for Discharge Goal: Compliance with treatment plan for underlying cause of condition will improve Outcome: Adequate for Discharge   Problem: Physical Regulation: Goal: Ability to maintain clinical measurements within normal limits will  improve Outcome: Adequate for Discharge   Problem: Safety: Goal: Periods of time without injury will increase Outcome: Adequate for Discharge   Problem: Activity: Goal: Will identify at least one activity in which they can participate Outcome: Adequate for Discharge   Problem: Coping: Goal: Ability to identify and develop effective coping behavior will improve Outcome: Adequate for Discharge Goal: Ability to interact with  others will improve Outcome: Adequate for Discharge Goal: Demonstration of participation in decision-making regarding own care will improve Outcome: Adequate for Discharge Goal: Ability to use eye contact when communicating with others will improve Outcome: Adequate for Discharge   Problem: Health Behavior/Discharge Planning: Goal: Identification of resources available to assist in meeting health care needs will improve Outcome: Adequate for Discharge   Problem: Self-Concept: Goal: Will verbalize positive feelings about self Outcome: Adequate for Discharge   Problem: Education: Goal: Ability to make informed decisions regarding treatment will improve Outcome: Adequate for Discharge   Problem: Coping: Goal: Coping ability will improve Outcome: Adequate for Discharge   Problem: Health Behavior/Discharge Planning: Goal: Identification of resources available to assist in meeting health care needs will improve Outcome: Adequate for Discharge   Problem: Medication: Goal: Compliance with prescribed medication regimen will improve Outcome: Adequate for Discharge   Problem: Self-Concept: Goal: Ability to disclose and discuss suicidal ideas will improve Outcome: Adequate for Discharge Goal: Will verbalize positive feelings about self Outcome: Adequate for Discharge   Problem: Education: Goal: Knowledge of disease or condition will improve Outcome: Adequate for Discharge Goal: Understanding of discharge needs will improve Outcome: Adequate for Discharge    Problem: Health Behavior/Discharge Planning: Goal: Ability to identify changes in lifestyle to reduce recurrence of condition will improve Outcome: Adequate for Discharge Goal: Identification of resources available to assist in meeting health care needs will improve Outcome: Adequate for Discharge   Problem: Physical Regulation: Goal: Complications related to the disease process, condition or treatment will be avoided or minimized Outcome: Adequate for Discharge   Problem: Safety: Goal: Ability to remain free from injury will improve Outcome: Adequate for Discharge

## 2021-03-27 NOTE — Plan of Care (Signed)
  Problem: Self-Concept: Goal: Ability to modify response to factors that promote anxiety will improve Outcome: Progressing   Problem: Education: Goal: Utilization of techniques to improve thought processes will improve Outcome: Progressing Goal: Knowledge of the prescribed therapeutic regimen will improve Outcome: Progressing   

## 2021-03-28 NOTE — [facility] Group Notes (Signed)
ADULT GRIEF GROUP NOTE:   Spiritual care group on grief and loss facilitated by Kathleen Argue, [facility]   Group Goal:   Support / Education around grief and loss   Members engage in facilitated group support and psycho-social education.   Group Description:   Following introductions and group rules, group members engaged in facilitated group dialog and support around topic of loss, with particular support around experiences of loss in their lives. Group Identified types of loss (relationships / self / things) and identified patterns, circumstances, and changes that precipitate losses. Reflected on thoughts / feelings around loss, normalized grief responses, and recognized variety in grief experience. Group noted Worden's four tasks of grief in discussion.   Group drew on Adlerian / Rogerian, narrative, MI,   Patient Progress:  Fitzroy attended and participated in group.  He did not share much but showed attentive listening.  Chaplain Dyanne Carrel, [facility] Pager, [phone] 10:32 AM

## 2021-04-13 ENCOUNTER — Ambulatory Visit (HOSPITAL_COMMUNITY)
Admission: EM | Admit: 2021-04-13 | Discharge: 2021-04-13 | Disposition: A | Discharge status: Home / Self Care | Payer: No Payment, Other | Attending: Nurse Practitioner | Admitting: Nurse Practitioner

## 2021-04-13 DIAGNOSIS — F1020 Alcohol dependence, uncomplicated: Secondary | ICD-10-CM | POA: Insufficient documentation

## 2021-04-13 DIAGNOSIS — F17210 Nicotine dependence, cigarettes, uncomplicated: Secondary | ICD-10-CM | POA: Diagnosis not present

## 2021-04-13 DIAGNOSIS — Z20822 Contact with and (suspected) exposure to covid-19: Secondary | ICD-10-CM | POA: Insufficient documentation

## 2021-04-13 DIAGNOSIS — F332 Major depressive disorder, recurrent severe without psychotic features: Secondary | ICD-10-CM | POA: Insufficient documentation

## 2021-04-13 LAB — CBC WITH DIFFERENTIAL/PLATELET
Abs Immature Granulocytes: 0.08 10*3/uL — ABNORMAL HIGH (ref 0.00–0.07)
Basophils Absolute: 0.1 10*3/uL (ref 0.0–0.1)
Basophils Relative: 0 %
Eosinophils Absolute: 0.1 10*3/uL (ref 0.0–0.5)
Eosinophils Relative: 1 %
HCT: 45.9 % (ref 39.0–52.0)
Hemoglobin: 15.3 g/dL (ref 13.0–17.0)
Immature Granulocytes: 1 %
Lymphocytes Relative: 23 %
Lymphs Abs: 3.5 10*3/uL (ref 0.7–4.0)
MCH: 30.8 pg (ref 26.0–34.0)
MCHC: 33.3 g/dL (ref 30.0–36.0)
MCV: 92.4 fL (ref 80.0–100.0)
Monocytes Absolute: 1.5 10*3/uL — ABNORMAL HIGH (ref 0.1–1.0)
Monocytes Relative: 10 %
Neutro Abs: 10.1 10*3/uL — ABNORMAL HIGH (ref 1.7–7.7)
Neutrophils Relative %: 65 %
Platelets: 290 10*3/uL (ref 150–400)
RBC: 4.97 MIL/uL (ref 4.22–5.81)
RDW: 13.3 % (ref 11.5–15.5)
WBC: 15.3 10*3/uL — ABNORMAL HIGH (ref 4.0–10.5)
nRBC: 0 % (ref 0.0–0.2)

## 2021-04-13 LAB — POCT URINE DRUG SCREEN - MANUAL ENTRY (I-SCREEN)
POC Amphetamine UR: NOT DETECTED
POC Buprenorphine (BUP): NOT DETECTED
POC Cocaine UR: NOT DETECTED
POC Marijuana UR: NOT DETECTED
POC Methadone UR: NOT DETECTED
POC Methamphetamine UR: NOT DETECTED
POC Morphine: NOT DETECTED
POC Oxazepam (BZO): POSITIVE — AB
POC Oxycodone UR: NOT DETECTED
POC Secobarbital (BAR): NOT DETECTED

## 2021-04-13 LAB — COMPREHENSIVE METABOLIC PANEL
ALT: 27 U/L (ref 0–44)
AST: 30 U/L (ref 15–41)
Albumin: 4.6 g/dL (ref 3.5–5.0)
Alkaline Phosphatase: 111 U/L (ref 38–126)
Anion gap: 14 (ref 5–15)
BUN: <5 mg/dL — ABNORMAL LOW (ref 6–20)
CO2: 26 mmol/L (ref 22–32)
Calcium: 9.5 mg/dL (ref 8.9–10.3)
Chloride: 96 mmol/L — ABNORMAL LOW (ref 98–111)
Creatinine, Ser: 1.01 mg/dL (ref 0.61–1.24)
GFR, Estimated: >60 mL/min (ref >60)
Glucose, Bld: 107 mg/dL — ABNORMAL HIGH (ref 70–99)
Potassium: 4.2 mmol/L (ref 3.5–5.1)
Sodium: 136 mmol/L (ref 135–145)
Total Bilirubin: 1.5 mg/dL — ABNORMAL HIGH (ref 0.3–1.2)
Total Protein: 7.5 g/dL (ref 6.5–8.1)

## 2021-04-13 LAB — RESP PANEL BY RT-PCR (FLU A&B, COVID) ARPGX2
Influenza A by PCR: NEGATIVE
Influenza B by PCR: NEGATIVE
SARS Coronavirus 2 by RT PCR: NEGATIVE

## 2021-04-13 LAB — ETHANOL: Alcohol, Ethyl (B): <10 mg/dL (ref <10)

## 2021-04-13 LAB — POC SARS CORONAVIRUS 2 AG: SARSCOV2ONAVIRUS 2 AG: NEGATIVE

## 2021-04-13 LAB — POC SARS CORONAVIRUS 2 AG -  ED: SARS Coronavirus 2 Ag: NEGATIVE

## 2021-04-13 MED ORDER — PAROXETINE HCL 20 MG PO TABS
20.0000 mg | ORAL_TABLET | Freq: Every day | ORAL | Status: DC
  Administered 2021-04-13: 20 mg via ORAL
  Filled 2021-04-13: qty 1

## 2021-04-13 MED ORDER — GABAPENTIN 300 MG PO CAPS
300.0000 mg | ORAL_CAPSULE | Freq: Two times a day (BID) | ORAL | Status: DC
Start: 2021-04-13 — End: 2021-04-13
  Administered 2021-04-13 (×2): 300 mg via ORAL
  Filled 2021-04-13 (×2): qty 1

## 2021-04-13 MED ORDER — HYDROXYZINE HCL 25 MG PO TABS
25.0000 mg | ORAL_TABLET | Freq: Three times a day (TID) | ORAL | Status: DC | PRN
  Filled 2021-04-13: qty 1

## 2021-04-13 MED ORDER — ALUM & MAG HYDROXIDE-SIMETH 200-200-20 MG/5ML PO SUSP: 30.0000 mL | ORAL | Status: DC | PRN

## 2021-04-13 MED ORDER — LOPERAMIDE HCL 2 MG PO CAPS: 2.0000 mg | ORAL_CAPSULE | ORAL | Status: DC | PRN

## 2021-04-13 MED ORDER — ACETAMINOPHEN 325 MG PO TABS
650.0000 mg | ORAL_TABLET | Freq: Four times a day (QID) | ORAL | Status: DC | PRN
Start: 2021-04-13 — End: 2021-04-13

## 2021-04-13 MED ORDER — CHLORDIAZEPOXIDE HCL 25 MG PO CAPS
25.0000 mg | ORAL_CAPSULE | Freq: Four times a day (QID) | ORAL | Status: DC | PRN
Start: 2021-04-13 — End: 2021-04-13
  Administered 2021-04-13 (×2): 25 mg via ORAL
  Filled 2021-04-13 (×3): qty 1

## 2021-04-13 MED ORDER — THIAMINE HCL 100 MG PO TABS
100.0000 mg | ORAL_TABLET | Freq: Every day | ORAL | Status: DC
Start: 2021-04-14 — End: 2021-04-13

## 2021-04-13 MED ORDER — MAGNESIUM HYDROXIDE 400 MG/5ML PO SUSP: 30.0000 mL | Freq: Every day | ORAL | Status: DC | PRN

## 2021-04-13 MED ORDER — NICOTINE POLACRILEX 2 MG MT GUM
2.0000 mg | CHEWING_GUM | OROMUCOSAL | Status: DC | PRN
  Administered 2021-04-13 (×2): 2 mg via ORAL
  Filled 2021-04-13 (×2): qty 1

## 2021-04-13 MED ORDER — ONDANSETRON 4 MG PO TBDP: 4.0000 mg | ORAL_TABLET | Freq: Four times a day (QID) | ORAL | Status: DC | PRN

## 2021-04-13 NOTE — BH Assessment (Addendum)
Comprehensive Clinical Assessment (CCA) Note  04/13/2021 Nunzio CoryMarcus Myers Bingley [id number]  Disposition: Nira ConnJason Berry, PMHNP [facility] and will be reassessed by psychiatry.   Flowsheet Row ED [facility]) [facility] from 03/21/2021 in [facility]  C-SSRS RISK CATEGORY Error: Q7 should not be populated when Q6 is No High Risk High Risk     The patient demonstrates the following risk factors for suicide: Chronic risk factors for suicide include: psychiatric disorder of Major Depressive Disorder, Alcohol use Disorder, severe, dependence and substance use disorder. Acute risk factors for suicide include: unemployment and Pt is currently suicidal and is unable to contract for safety. Protective factors for this patient include: positive social support. Considering these factors, the overall suicide risk at this point appears to be high. Patient is appropriate for outpatient follow up.  Vertis KelchMarcus M. [name] is a 40 year old male who presents voluntary and unaccompanied to [facility]. Clinician asked the pt, "what brought you to the hospital?" Pt reported, he came due to having suicidal ideations for several months and alcohol use. Pt reports, having a non-specific plan. Pt reported, he and his girlfriend are homeless. PT reports he has been homeless for the last six months. Pt's girlfriend reported, they're currently staying with friends. Pt denies, HI, AVH, self-injurious behaviors and access to weapons.   Pt drinking two tall cans, yesterday. Pt reported, drinking case of beer everyday. Pt denies, being linked to OPT resources (medication management and/or counseling.) Pt [facility]-03/27/2021 for depression.   Pt presents quiet, awake with normal speech. Pt's mood was depressed,  hopeless, worthless. Pt's affect was congruent. Pt's thought content was appropriate to mood and circumstances. Pt's insight is fair. Pt's judgement is poor. Pt reported, if discharged he can not contract for safety and probably cut his wrist.   Diagnosis: Major Depressive Disorder, recurrent, severe without psychotic features.                     Alcohol use Disorder, severe, dependence.   Chief Complaint: No chief complaint on file.  Visit Diagnosis:     CCA Screening, Triage and Referral (STR)  Patient Reported Information How did you hear about us? Self  Referral name: Self Referral  Referral phone number: [phone])   Whom do you see for routine medical problems? I don't have a doctor  Practice/Facility Name: No data recorded Practice/Facility Phone Number: No data recorded Name of Contact: No data recorded Contact Number: No data recorded Contact Fax Number: No data recorded Prescriber Name: No data recorded Prescriber Address (if known): No data recorded  What Is the Reason for Your Visit/Call Today? Suicidal thoughts and alcohol use.  How Long Has This Been Causing You Problems? 1 wk - 1 month  What Do You Feel Would Help You the Most Today? Treatment for Depression or other mood problem; Alcohol or Drug Use Treatment   Have You Recently Been in Any Inpatient Treatment (Hospital/Detox/Crisis Center/28-Day Program)? Yes  Name/Location of Program/Hospital:[facility] [place]  How Long Were You There? Per pt, about a week.  When Were You Discharged? No data recorded  Have You Ever Re[facility] Before? Yes  [facility]? Pt has a previous inpatient admission and ED visit.   Have You Recently  Had Any Thoughts About Hurting Yourself? Yes  Are You Planning to Commit Suicide/Harm Yourself At This time? No   Have you Recently Had Thoughts About Hurting Someone [name]? No  Explanation: No data recorded  Have  You Used Any Alcohol or Drugs in the Past 24 Hours? Yes  How Long Ago Did You Use Drugs or Alcohol? 0000 (Last night, exact time unknown)  What Did You Use and How Much? Two tall cans.   Do You Currently Have a Therapist/Psychiatrist? No  Name of Therapist/Psychiatrist: No data recorded  Have You Been Recently Discharged From Any Office Practice or Programs? No  Explanation of Discharge From Practice/Program: No data recorded    CCA Screening Triage Referral Assessment Type of Contact: Face-to-Face  Is this Initial or Reassessment? Initial Assessment  Date Telepsych consult ordered in [facility]:  03/21/2021  Time Telepsych consult ordered in [facility]:  0656   Patient Reported Information Reviewed? Yes  Patient Left Without Being Seen? No data recorded Reason for Not Completing Assessment: No data recorded  Collateral Involvement: [name], girlfriend, [phone].   Does Patient Have a Automotive engineer Guardian? No data recorded Name and Contact of Legal Guardian: No data recorded If Minor and Not Living with Parent(s), Who has Custody? No data recorded Is CPS involved or ever been involved? Never  Is APS involved or ever been involved? Never   Patient Determined To Be At Risk for Harm To Self or Others Based on Review of Patient Reported Information or Presenting Complaint? Yes, for Self-Harm  Method: No data recorded Availability of Means: No data recorded Intent: No data recorded Notification Required: No data recorded Additional Information for Danger to Others Potential: No data recorded Additional Comments for Danger to Others Potential: No data recorded Are There Guns or Other Weapons in Your Home? No data recorded Types of Guns/Weapons: No data recorded Are These Weapons Safely Secured?                            No data recorded Who Could Verify You Are Able To Have These Secured: No data recorded Do You Have any Outstanding Charges, Pending Court Dates,  Parole/Probation? No data recorded Contacted To Inform of Risk of Harm To Self or Others: Other: Comment (no one available to notify)   [facility] of Assessment: [facility]   Does Patient Present under Involuntary Commitment? No  IVC Papers Initial File Date: No data recorded  [place] of Residence: Other (Comment) [place].)   Patient Currently Receiving the Following Services: Not Receiving Services   Determination of Need: Urgent (48 hours)   Options For Referral: [facility]     CCA Biopsychosocial Intake/Chief Complaint:  Suicidal ideations, alcohol use.  Current Symptoms/Problems: Suicide ideations alcohol use.   Patient Reported Schizophrenia/Schizoaffective Diagnosis in Past: No   Strengths: Not assessed.  Preferences: Not assessed.  Abilities: Not assessed.   Type of Services Patient Feels are Needed: Pt reports he's unable to contract for safety.   Initial Clinical Notes/Concerns: NA   Mental Health Symptoms Depression:  Sleep (too much or little); Worthlessness; Hopelessness; Fatigue; Increase/decrease in appetite; Irritability; Tearfulness; Difficulty Concentrating (Isolation, guilt.)   Duration of Depressive symptoms: No data recorded  Mania:  None   Anxiety:   Worrying (Panic attacks.)   Psychosis:  None   Duration of Psychotic symptoms: No data recorded  Trauma:  None   Obsessions:  None   Compulsions:  None   Inattention:  Disorganized; Forgetful; Loses things   Hyperactivity/Impulsivity:  Feeling of restlessness; Fidgets with hands/feet   Oppositional/Defiant Behaviors:  Easily annoyed   Emotional Irregularity:  None   Other Mood/Personality Symptoms:  No data recorded   Mental Status Exam Appearance and self-care  Stature:  Average   Weight:  Average weight   Clothing:  Casual   Grooming:  Normal   Cosmetic use:  None   Posture/gait:  Normal   Motor activity:  Not Remarkable   Sensorium   Attention:  Normal   Concentration:  Normal   Orientation:  X5   Recall/memory:  Normal   Affect and Mood  Affect:  Congruent   Mood:  Depressed; Hopeless; Worthless   Relating  Eye contact:  Normal   Facial expression:  Depressed   Attitude toward examiner:  Cooperative   Thought and Language  Speech flow: Normal   Thought content:  Appropriate to Mood and Circumstances   Preoccupation:  None   Hallucinations:  None   Organization:  No data recorded  [employer] of Knowledge:  Fair   Intelligence:  Average   Abstraction:  Normal   Judgement:  Poor   Reality Testing:  Distorted   Insight:  Fair   Decision Making:  Impulsive   Social Functioning  Social Maturity:  Impulsive   Social Judgement:  Heedless   Stress  Stressors:  Housing   Coping Ability:  Overwhelmed   Skill Deficits:  Decision making   Supports:  Friends/Service system     Religion: Religion/Spirituality Are You A Religious Person?: Yes What is Your Religious Affiliation?: Christian  Leisure/Recreation: Leisure / Recreation Do You Have Hobbies?: Yes Leisure and Hobbies: Basketball and golf.  Exercise/Diet: Exercise/Diet Do You Exercise?: No Do You Follow a Special Diet?: No Do You Have Any Trouble Sleeping?: Yes Explanation of Sleeping Difficulties: Pt reported, not getting sleep.   CCA Employment/Education Employment/Work Situation: Employment / Work Situation Employment situation: Unemployed What is the longest time patient has a held a job?: Not assessed. Where was the patient employed at that time?: Not assessed. Has patient ever been in the [employer]?: No  Education: Education Is Patient Currently Attending School?: No Did Garment/textile technologist From [employer]?: Yes Did You Attend College?: Yes What Type of College Degree Do you Have?: [employer].   CCA Family/Childhood History Family and Relationship History: Family  history Marital status: Long term relationship Long term relationship, how long?: 3 years What types of issues is patient dealing with in the relationship?: Pt reports both he and girlfriend are homeless. Are you sexually active?: Yes What is your sexual orientation?: Not assessed. Has your sexual activity been affected by drugs, alcohol, medication, or emotional stress?: Not assessed. Does patient have children?: No  Childhood History:  Childhood History By whom was/is the patient raised?: Both parents (Per chart.) Additional childhood history information: Not assessed. Description of patient's relationship with caregiver when they were a child: Not assessed. Patient's description of current relationship with people who raised him/her: Not assessed. How were you disciplined when you got in trouble as a child/adolescent?: Not assessed. Does patient have siblings?: Yes Number of Siblings: 1 Description of patient's current relationship with siblings: Not assessed. Did patient suffer any verbal/emotional/physical/sexual abuse as a child?: No Did patient suffer from severe childhood neglect?: No Has patient ever been sexually abused/assaulted/raped as an adolescent or adult?: No Was the patient ever a victim of a crime or  a disaster?: No Witnessed domestic violence?: No Has patient been affected by domestic violence as an adult?:  (NA)  Child/Adolescent Assessment:     CCA Substance Use Alcohol/Drug Use: Alcohol / Drug Use Pain Medications: See MAR Prescriptions: See MAR Over the Counter: See MAR History of alcohol / drug use?: Yes Longest period of sobriety (when/how long): 4 months. Negative Consequences of Use: Personal relationships,Work / School,Financial Withdrawal Symptoms: Change in blood pressure,DTs,Sweats (Anxiety.) Substance #1 Name of Substance 1: Alochol. 1 - Age of First Use: 17. 1 - Amount (size/oz): Pt reported, drinking two tall cans. 1 - Frequency:  Everyday. 1 - Duration: Ongoing. 1 - Last Use / Amount: Two tall cans, yesterday. 1 - Method of Aquiring: Purchase. 1- Route of Use: Oral.    ASAM's:  Six Dimensions of Multidimensional Assessment  Dimension 1:  Acute Intoxication and/or Withdrawal Potential:   Dimension 1:  Description of individual's past and current experiences of substance use and withdrawal: Pt reports, sweating, anxiety, and a history of DT's, withdrawal symptoms from alcohol.  Dimension 2:  Biomedical Conditions and Complications:   Dimension 2:  Description of patient's biomedical conditions and  complications: Pt reports he is sweating and has anxiety.  Dimension 3:  Emotional, Behavioral, or Cognitive Conditions and Complications:  Dimension 3:  Description of emotional, behavioral, or cognitive conditions and complications: Pt has a previous diagnose of Major Depressive Disorder and Alcohol use Disorder, severe, dependence.  Dimension 4:  Readiness to Change:  Dimension 4:  Description of Readiness to Change criteria: Pt reports, he wants help and is unable to contract for safety.  Dimension 5:  Relapse, Continued use, or Continued Problem Potential:  Dimension 5:  Relapse, continued use, or continued problem potential critiera description: Pt reports wanting help with alcohol.  Dimension 6:  Recovery/Living Environment:  Dimension 6:  Recovery/Iiving environment criteria description: Patient is homeless and has minimal support  ASAM Severity Score: ASAM's Severity Rating Score: 11  ASAM Recommended Level of Treatment: ASAM Recommended Level of Treatment: Level III Residential Treatment   Substance use Disorder (SUD) Substance Use Disorder (SUD)  Checklist Symptoms of Substance Use: Continued use despite having a persistent/recurrent physical/psychological problem caused/exacerbated by use,Continued use despite persistent or recurrent social, interpersonal problems, caused or exacerbated by use,Evidence of  tolerance,Evidence of withdrawal (Comment),Persistent desire or unsuccessful efforts to cut down or control use,Presence of craving or strong urge to use,Recurrent use that results in a failure to fulfill major role obligations (work, school, home),Social, occupational, recreational activities given up or reduced due to use,Substance(s) often taken in larger amounts or over longer times than was intended  Recommendations for Services/Supports/Treatments: Recommendations for Services/Supports/Treatments Recommendations For Services/Supports/Treatments: Other (Comment) ([facility].)  DSM5 Diagnoses: Patient Active Problem List   Diagnosis Date Noted  . MDD (major depressive disorder), recurrent severe, without psychosis (HCC) 03/22/2021  . Suicidal ideation 03/21/2021  . Alcohol-induced mood disorder (HCC)     Referrals to Alternative Service(s): Referred to Alternative Service(s):   Place:   Date:   Time:    Referred to Alternative Service(s):   Place:   Date:   Time:    Referred to Alternative Service(s):   Place:   Date:   Time:    Referred to Alternative Service(s):   Place:   Date:   Time:     [place] Pulling, [facility]  Comprehensive Clinical Assessment (CCA) Screening, Triage and Referral Note  04/13/2021 Deanna Boehlke [id number]  Chief Complaint: No chief complaint on file.  Visit Diagnosis:  Patient Reported Information How did you hear about [place]? Self   Referral name: Self Referral   Referral phone number: [phone])  Whom do you see for routine medical problems? I don't have a doctor   Practice/Facility Name: No data recorded  Practice/Facility Phone Number: No data recorded  Name of Contact: No data recorded  Contact Number: No data recorded  Contact Fax Number: No data recorded  Prescriber Name: No data recorded  Prescriber Address (if known): No data recorded What Is the Reason for Your Visit/Call Today? Suicidal thoughts and alcohol use.  How Long Has  This Been Causing You Problems? 1 wk - 1 month  Have You Recently Been in Any Inpatient Treatment (Hospital/Detox/Crisis Center/28-Day Program)? Yes   Name/Location of Program/Hospital:[facility] in [place]   How Long Were You There? Per pt, about a week.   When Were You Discharged? No data recorded Have You Ever Received Services From [facility] Before? Yes   Who Do You See at [place]? Pt has a previous inpatient admission and ED visit.  Have You Recently Had Any Thoughts About Hurting Yourself? Yes   Are You Planning to Commit Suicide/Harm Yourself At This time?  No  Have you Recently Had Thoughts About Hurting Someone [name]? No   Explanation: No data recorded Have You Used Any Alcohol or Drugs in the Past 24 Hours? Yes   How Long Ago Did You Use Drugs or Alcohol?  0000 (Last night, exact time unknown)   What Did You Use and How Much? Two tall cans.  What Do You Feel Would Help You the Most Today? Treatment for Depression or other mood problem; Alcohol or Drug Use Treatment  Do You Currently Have a Therapist/Psychiatrist? No   Name of Therapist/Psychiatrist: No data recorded  Have You Been Recently Discharged From Any Office Practice or Programs? No   Explanation of Discharge From Practice/Program:  No data recorded    CCA Screening Triage Referral Assessment Type of Contact: Face-to-Face   Is this Initial or Reassessment? Initial Assessment   Date Telepsych consult ordered in [facility]:  03/21/2021   Time Telepsych consult ordered in [facility]:  0656  Patient Reported Information Reviewed? Yes   Patient Left Without Being Seen? No data recorded  Reason for Not Completing Assessment: No data recorded Collateral Involvement: [name], girlfriend, [phone].  Does Patient Have a Automotive engineer Guardian? No data recorded  Name and Contact of Legal Guardian:  No data recorded If Minor and Not Living with Parent(s), Who has Custody? No data recorded Is  CPS involved or ever been involved? Never  Is APS involved or ever been involved? Never  Patient Determined To Be At Risk for Harm To Self or Others Based on Review of Patient Reported Information or Presenting Complaint? Yes, for Self-Harm   Method: No data recorded  Availability of Means: No data recorded  Intent: No data recorded  Notification Required: No data recorded  Additional Information for Danger to Others Potential:  No data recorded  Additional Comments for Danger to Others Potential:  No data recorded  Are There Guns or Other Weapons in Your Home?  No data recorded   Types of Guns/Weapons: No data recorded   Are These Weapons Safely Secured?                              No data recorded   Who Could  Verify You Are Able To Have These Secured:    No data recorded Do You Have any Outstanding Charges, Pending Court Dates, Parole/Probation? No data recorded Contacted To Inform of Risk of Harm To Self or Others: Other: Comment (no one available to notify)  Location of Assessment: [facility] Assessment Services  Does Patient Present under Involuntary Commitment? No   IVC Papers Initial File Date: No data recorded  [place] of Residence: Other (Comment) [place].)  Patient Currently Receiving the Following Services: Not Receiving Services   Determination of Need: Urgent (48 hours)   Options For Referral: [facility]   [place] Pulling, [facility]     Redmond Pulling, MS, [facility], [facility] Triage Specialist [phone]

## 2021-04-13 NOTE — Progress Notes (Signed)
Provider Ricky Ala, requested CSW assistance with detox bed.  CSW called [facility] and they report that they had male detox bed availability.  CSW met with patient and patient provided consent to send information to [facility] for possible detox bed.  CSW faxed over patient information and is awaiting disposition status.    CSW will continue to monitor placement.    [place] Gustafson, LCSW, [place] Social Worker  [facility]

## 2021-04-13 NOTE — ED Notes (Signed)
Pt has been brought on the unit and is now laying quietly in bed. Nutrition has been offered and accepted. Pt asked for a nicotine patch or gum. This nurse advised the patient that due to them both being a stimulant he would have to wait until the morning to receive due

## 2021-04-13 NOTE — ED Provider Notes (Addendum)
Behavioral Health Admission H&P [facility] & OBS)  Date: 04/13/21 Patient Name: Carlos Flowers MRN: [id number] Chief Complaint: No chief complaint on file.  Chief Complaint/Presenting Problem: Suicidal ideations, alcohol use.  Diagnoses:  Final diagnoses:  Alcohol use disorder, severe, dependence (HCC)  MDD (major depressive disorder), recurrent severe, without psychosis (HCC)    HPI: Carlos Flowers is a 40 y.o. male with a history of alcohol use disorder and MDD who presents to [facility] voluntarily with law enforcement due to SI and requests for alcohol detox. On evaluation patient is alert and oriented x 4, pleasant, and cooperative. Speech is clear and coherent. Patient reports that he has been drinking a case of beer daily. States that he started drinking at the age of 15 and the amount he drinks has graudally increased. Reports that his last drink was yesterday. He denies a history of withdrawal seizures. He reports tremors. Noted mild distal tremor on assessment. He reports his mood as depressed and affect is congruent with mood. Thought process is coherent and thought content is logical. Denies auditory and hallucinations. No indication that patient is responding to internal stimuli. No evidence of delusional thought content. Reports suicidal thoughts. He initially denied a plan, but later states that he will cut his wrists if discharged. Denies a history of suicide attempts. Denies homicidal ideations.     On chart review, it is noted that patient presented to [facility] twice on 04/12/21 with similar presentation. He has had 12 ED visits at various facilities since 03/08/21. Patient was inpatient at [facility] 03/22/21 and discharged on gabapentin 100 mg BID, hydroxyzine 25 mg TID prn, paroxetine 20 mg daily, and trazodone 50 mg QHS prn. He was inpatient at [facility] 03/10/21-03/15/21. He was discharged with plans to go to [facility] for a 6 month rehab program, however, he left after one day.    PHQ 2-9:  Flowsheet Row ED from 08/11/2020 in [facility]  Thoughts that you would be better off dead, or of hurting yourself in some way More than half the days  PHQ-9 Total Score 18      Flowsheet Row ED from 04/13/2021 in [facility] Admission (Discharged) [facility] from 03/21/2021 in [facility]  C-SSRS RISK CATEGORY Error: Q7 should not be populated when Q6 is No High Risk High Risk       Total Time spent with patient: 20 minutes  Musculoskeletal  Strength & Muscle Tone: within normal limits Gait & Station: normal Patient leans: N/A  Psychiatric Specialty Exam  Presentation General Appearance: Appropriate for Environment; Casual; Fairly Groomed  Eye Contact:Good  Speech:Clear and Coherent; Normal Rate  Speech Volume:Normal  Handedness:Right   Mood and Affect  Mood:Depressed; Hopeless; Worthless  Affect:Congruent   Thought Process  Thought Processes:Coherent; Linear  Descriptions of Associations:Intact  Orientation:Full (Time, Place and Person)  Thought Content:WDL  Diagnosis of Schizophrenia or Schizoaffective disorder in past: No   Hallucinations:Hallucinations: None  Ideas of Reference:None  Suicidal Thoughts:Suicidal Thoughts: Yes, Active SI Active Intent and/or Plan: Without Intent; Without Plan  Homicidal Thoughts:Homicidal Thoughts: No   Sensorium  Memory:Immediate Good; Recent Good; Remote Good  Judgment:Fair  Insight:Fair   Executive Functions  Concentration:Good  Attention Span:Good  Recall:Good  Fund of Knowledge:Good  Language:Good   Psychomotor Activity  Psychomotor Activity:Psychomotor Activity: Normal   Assets  Assets:Communication Skills; Desire for Improvement   Sleep  Sleep:Sleep: Fair  Nutritional Assessment (For OBS and [facility]BC admissions only) Has the  patient had a weight loss or gain of 10 pounds or more in the last 3 months?: No Has the patient had a decrease in food intake/or appetite?: No Does the patient have dental problems?: No Does the patient have eating habits or behaviors that may be indicators of an eating disorder including binging or inducing vomiting?: No Has the patient recently lost weight without trying?: No Has the patient been eating poorly because of a decreased appetite?: No Malnutrition Screening Tool Score: 0    Physical Exam Constitutional:      General: He is not in acute distress.    Appearance: He is not ill-appearing, toxic-appearing or diaphoretic.  HENT:     Head: Normocephalic.     Right Ear: External ear normal.     Left Ear: External ear normal.  Eyes:     Pupils: Pupils are equal, round, and reactive to light.  Cardiovascular:     Rate and Rhythm: Normal rate.  Pulmonary:     Effort: Pulmonary effort is normal. No respiratory distress.  Musculoskeletal:        General: Normal range of motion.  Skin:    General: Skin is warm and dry.  Neurological:     Mental Status: He is alert and oriented to person, place, and time.  Psychiatric:        Mood and Affect: Mood is anxious and depressed.        Speech: Speech normal.        Behavior: Behavior is cooperative.        Thought Content: Thought content is not paranoid or delusional. Thought content includes suicidal ideation. Thought content does not include homicidal ideation.    Review of Systems  Constitutional: Negative for chills, diaphoresis, fever, malaise/fatigue and weight loss.  HENT: Negative for congestion.   Respiratory: Negative for cough and shortness of breath.   Cardiovascular: Negative for chest pain and palpitations.  Gastrointestinal: Negative for diarrhea, nausea and vomiting.  Neurological: Negative for dizziness and seizures.  Psychiatric/Behavioral: Positive for depression, substance abuse and suicidal ideas. Negative  for hallucinations and memory loss. The patient is nervous/anxious and has insomnia.   All other systems reviewed and are negative.   Blood pressure (!) 149/107, pulse (!) 107, temperature 98.6 F (37 C), temperature source Oral, resp. rate 18, SpO2 96 %. There is no height or weight on file to calculate BMI.  Past Psychiatric History: MDD, alcohol use disorder  Is the patient at risk to self? Yes  Has the patient been a risk to self in the past 6 months? Yes .    Has the patient been a risk to self within the distant past? Yes   Is the patient a risk to others? No   Has the patient been a risk to others in the past 6 months? No   Has the patient been a risk to others within the distant past? No   Past Medical History:  Past Medical History:  Diagnosis Date  . Alcoholism (HCC)   . Anxiety   . Depression   . Substance abuse (HCC)    No past surgical history on file.  Family History:  Family History  Problem Relation Age of Onset  . Cancer Maternal Grandmother   . Heart disease Paternal Grandmother   . Heart disease Paternal Grandfather     Social History:  Social History   Socioeconomic History  . Marital status: Single  Spouse name: Not on file  . Number of children: Not on file  . Years of education: Not on file  . Highest education level: Not on file  Occupational History  . Not on file  Tobacco Use  . Smoking status: Current Every Day Smoker    Packs/day: 2.00    Years: 17.00    Pack years: 34.00    Types: Cigarettes  . Smokeless tobacco: Never Used  Vaping Use  . Vaping Use: Never used  Substance and Sexual Activity  . Alcohol use: Yes    Alcohol/week: 10.0 - 12.0 standard drinks    Types: 10 - 12 Cans of beer per week  . Drug use: Not Currently    Types: Cocaine, Heroin    Comment: last use 12/17/2018  . Sexual activity: Not Currently  Other Topics Concern  . Not on file  Social History Narrative  . Not on file   Social Determinants of Health    Financial Resource Strain: Not on file  Food Insecurity: Not on file  Transportation Needs: Not on file  Physical Activity: Not on file  Stress: Not on file  Social Connections: Not on file  Intimate Partner Violence: Not on file    SDOH:  SDOH Screenings   Alcohol Screen: Medium Risk  . Last Alcohol Screening Score (AUDIT): 30  Depression (PHQ2-9): Medium Risk  . PHQ-2 Score: 18  Financial Resource Strain: Not on file  Food Insecurity: Not on file  Housing: Not on file  Physical Activity: Not on file  Social Connections: Not on file  Stress: Not on file  Tobacco Use: High Risk  . Smoking Tobacco Use: Current Every Day Smoker  . Smokeless Tobacco Use: Never Used  Transportation Needs: Not on file    Last Labs:  Admission on 04/13/2021  Component Date Value Ref Range Status  . SARS Coronavirus 2 Ag 04/13/2021 Negative  Negative Preliminary  . POC Amphetamine UR 04/13/2021 None Detected  NONE DETECTED (Cut Off Level 1000 ng/mL) Final  . POC Secobarbital (BAR) 04/13/2021 None Detected  NONE DETECTED (Cut Off Level 300 ng/mL) Final  . POC Buprenorphine (BUP) 04/13/2021 None Detected  NONE DETECTED (Cut Off Level 10 ng/mL) Final  . POC Oxazepam (BZO) 04/13/2021 Positive* NONE DETECTED (Cut Off Level 300 ng/mL) Final  . POC Cocaine UR 04/13/2021 None Detected  NONE DETECTED (Cut Off Level 300 ng/mL) Final  . POC Methamphetamine UR 04/13/2021 None Detected  NONE DETECTED (Cut Off Level 1000 ng/mL) Final  . POC Morphine 04/13/2021 None Detected  NONE DETECTED (Cut Off Level 300 ng/mL) Final  . POC Oxycodone UR 04/13/2021 None Detected  NONE DETECTED (Cut Off Level 100 ng/mL) Final  . POC Methadone UR 04/13/2021 None Detected  NONE DETECTED (Cut Off Level 300 ng/mL) Final  . POC Marijuana UR 04/13/2021 None Detected  NONE DETECTED (Cut Off Level 50 ng/mL) Final  . SARSCOV2ONAVIRUS 2 AG 04/13/2021 NEGATIVE  NEGATIVE Final   Comment: (NOTE) SARS-CoV-2 antigen NOT DETECTED.    Negative results are presumptive.  Negative results do not preclude SARS-CoV-2 infection and should not be used as the sole basis for treatment or other patient management decisions, including infection  control decisions, particularly in the presence of clinical signs and  symptoms consistent with COVID-19, or in those who have been in contact with the virus.  Negative results must be combined with clinical observations, patient history, and epidemiological information. The expected result is Negative.  Fact Sheet for Patients: https://www.jennings-kim.com/https://www.fda.gov/media/141569/download  Fact Sheet for Healthcare Providers: https://alexander-rogers.biz/  This test is not yet approved or cleared by the [place] FDA and  has been authorized for detection and/or diagnosis of SARS-CoV-2 by FDA under an Emergency Use Authorization (EUA).  This EUA will remain in effect (meaning this test can be used) for the duration of  the COV                          ID-19 declaration under Section 564(b)(1) of the Act, 21 U.S.C. section 360bbb-3(b)(1), unless the authorization is terminated or revoked sooner.    Admission on 03/22/2021, Discharged on 03/27/2021  Component Date Value Ref Range Status  . Cholesterol 03/24/2021 142  0 - 200 mg/dL Final  . Triglycerides 03/24/2021 58  <150 mg/dL Final  . HDL 16/09/9603 39* >40 mg/dL Final  . Total CHOL/HDL Ratio 03/24/2021 3.6  RATIO Final  . VLDL 03/24/2021 12  0 - 40 mg/dL Final  . LDL Cholesterol 03/24/2021 91  0 - 99 mg/dL Final   Comment:        Total Cholesterol/HDL:CHD Risk Coronary Heart Disease Risk Table                     Men   Women  1/2 Average Risk   3.4   3.3  Average Risk       5.0   4.4  2 X Average Risk   9.6   7.1  3 X Average Risk  23.4   11.0        Use the calculated Patient Ratio above and the CHD Risk Table to determine the patient's CHD Risk.        ATP III CLASSIFICATION (LDL):  <100     mg/dL   Optimal   540-981  mg/dL   Near or Above                    Optimal  130-159  mg/dL   Borderline  191-478  mg/dL   High  >295     mg/dL   Very High Performed at [facility], [address], [place], [place] [address]   . TSH 03/24/2021 1.052  0.350 - 4.500 uIU/mL Final   Comment: Performed by a 3rd Generation assay with a functional sensitivity of <=0.01 uIU/mL. Performed at [facility], [address] [place], [place] [address]   . Hgb A1c MFr Bld 03/24/2021 5.7* 4.8 - 5.6 % Final   Comment: (NOTE) Pre diabetes:          5.7%-6.4%  Diabetes:              >6.4%  Glycemic control for   <7.0% adults with diabetes   . Mean Plasma Glucose 03/24/2021 116.89  mg/dL Final   Performed at [facility], [address], [place], [place] [address]  Admission on 03/21/2021, Discharged on 03/22/2021  Component Date Value Ref Range Status  . Sodium 03/21/2021 135  135 - 145 mmol/L Final  . Potassium 03/21/2021 4.0  3.5 - 5.1 mmol/L Final  . Chloride 03/21/2021 102  98 - 111 mmol/L Final  . CO2 03/21/2021 21* 22 - 32 mmol/L Final  . Glucose, Bld 03/21/2021 101* 70 - 99 mg/dL Final   Glucose reference range applies only to samples taken after fasting for at least 8 hours.  . BUN 03/21/2021 13  6 - 20 mg/dL Final  .  Creatinine, Ser 03/21/2021 1.02  0.61 - 1.24 mg/dL Final  . Calcium 16/09/9603 9.6  8.9 - 10.3 mg/dL Final  . Total Protein 03/21/2021 9.2* 6.5 - 8.1 g/dL Final  . Albumin 54/08/8118 5.1* 3.5 - 5.0 g/dL Final  . AST 14/78/2956 28  15 - 41 U/L Final  . ALT 03/21/2021 30  0 - 44 U/L Final  . Alkaline Phosphatase 03/21/2021 83  38 - 126 U/L Final  . Total Bilirubin 03/21/2021 0.9  0.3 - 1.2 mg/dL Final  . GFR, Estimated 03/21/2021 >60  >60 mL/min Final   Comment: (NOTE) Calculated using the CKD-EPI Creatinine Equation (2021)   . Anion gap 03/21/2021 12  5 - 15 Final   Performed at [facility], [address],  [place], [place] [address]  . Alcohol, Ethyl (B) 03/21/2021 <10  <10 mg/dL Final   Comment: (NOTE) Lowest detectable limit for serum alcohol is 10 mg/dL.  For medical purposes only. Performed at [facility], [address], [place], [place] [address]   . Salicylate Lvl 03/21/2021 <7.0* 7.0 - 30.0 mg/dL Final   Performed at [facility], [address] [place], [place] [address]  . Acetaminophen (Tylenol), Serum 03/21/2021 <10* 10 - 30 ug/mL Final   Comment: (NOTE) Therapeutic concentrations vary significantly. A range of 10-30 ug/mL  may be an effective concentration for many patients. However, some  are best treated at concentrations outside of this range. Acetaminophen concentrations >150 ug/mL at 4 hours after ingestion  and >50 ug/mL at 12 hours after ingestion are often associated with  toxic reactions.  Performed at [facility], [address] [place], [place] [address]   . WBC 03/21/2021 8.2  4.0 - 10.5 K/uL Final  . RBC 03/21/2021 5.18  4.22 - 5.81 MIL/uL Final  . Hemoglobin 03/21/2021 16.3  13.0 - 17.0 g/dL Final  . HCT 32/44/0102 49.3  39.0 - 52.0 % Final  . MCV 03/21/2021 95.2  80.0 - 100.0 fL Final  . MCH 03/21/2021 31.5  26.0 - 34.0 pg Final  . MCHC 03/21/2021 33.1  30.0 - 36.0 g/dL Final  . RDW 72/53/6644 13.1  11.5 - 15.5 % Final  . Platelets 03/21/2021 261  150 - 400 K/uL Final  . nRBC 03/21/2021 0.0  0.0 - 0.2 % Final   Performed at [facility], [address], [place], [place] [address]  . Opiates 03/21/2021 NONE DETECTED  NONE DETECTED Final  . Cocaine 03/21/2021 NONE DETECTED  NONE DETECTED Final  . Benzodiazepines 03/21/2021 NONE DETECTED  NONE DETECTED Final  . Amphetamines 03/21/2021 NONE DETECTED  NONE DETECTED Final  . Tetrahydrocannabinol 03/21/2021 NONE DETECTED  NONE DETECTED Final  . Barbiturates 03/21/2021 NONE DETECTED  NONE DETECTED Final   Comment: (NOTE) DRUG SCREEN  FOR MEDICAL PURPOSES ONLY.  IF CONFIRMATION IS NEEDED FOR ANY PURPOSE, NOTIFY LAB WITHIN 5 DAYS.  LOWEST DETECTABLE LIMITS FOR URINE DRUG SCREEN Drug Class                     Cutoff (ng/mL) Amphetamine and metabolites    1000 Barbiturate and metabolites    200 Benzodiazepine                 200 Tricyclics and metabolites     300 Opiates and metabolites        300 Cocaine and metabolites        300 THC  50 Performed at [facility], [address], [place], [place] [address]   . SARS Coronavirus 2 by RT PCR 03/21/2021 NEGATIVE  NEGATIVE Final   Comment: (NOTE) SARS-CoV-2 target nucleic acids are NOT DETECTED.  The SARS-CoV-2 RNA is generally detectable in upper respiratory specimens during the acute phase of infection. The lowest concentration of SARS-CoV-2 viral copies this assay can detect is 138 copies/mL. A negative result does not preclude SARS-Cov-2 infection and should not be used as the sole basis for treatment or other patient management decisions. A negative result may occur with  improper specimen collection/handling, submission of specimen other than nasopharyngeal swab, presence of viral mutation(s) within the areas targeted by this assay, and inadequate number of viral copies(<138 copies/mL). A negative result must be combined with clinical observations, patient history, and epidemiological information. The expected result is Negative.  Fact Sheet for Patients:  BloggerCourse.com  Fact Sheet for Healthcare Providers:  SeriousBroker.it  This test is no                          t yet approved or cleared by the [place] FDA and  has been authorized for detection and/or diagnosis of SARS-CoV-2 by FDA under an Emergency Use Authorization (EUA). This EUA will remain  in effect (meaning this test can be used) for the duration of the COVID-19 declaration under Section  564(b)(1) of the Act, 21 U.S.C.section 360bbb-3(b)(1), unless the authorization is terminated  or revoked sooner.      . Influenza A by PCR 03/21/2021 NEGATIVE  NEGATIVE Final  . Influenza B by PCR 03/21/2021 NEGATIVE  NEGATIVE Final   Comment: (NOTE) The Xpert Xpress SARS-CoV-2/FLU/RSV plus assay is intended as an aid in the diagnosis of influenza from Nasopharyngeal swab specimens and should not be used as a sole basis for treatment. Nasal washings and aspirates are unacceptable for Xpert Xpress SARS-CoV-2/FLU/RSV testing.  Fact Sheet for Patients: BloggerCourse.com  Fact Sheet for Healthcare Providers: SeriousBroker.it  This test is not yet approved or cleared by the [place] FDA and has been authorized for detection and/or diagnosis of SARS-CoV-2 by FDA under an Emergency Use Authorization (EUA). This EUA will remain in effect (meaning this test can be used) for the duration of the COVID-19 declaration under Section 564(b)(1) of the Act, 21 U.S.C. section 360bbb-3(b)(1), unless the authorization is terminated or revoked.  Performed at [facility], [address], [place], [place] [address]   Admission on 03/18/2021, Discharged on 03/18/2021  Component Date Value Ref Range Status  . Troponin I (High Sensitivity) 03/18/2021 2  <18 ng/L Final   Comment: (NOTE) Elevated high sensitivity troponin I (hsTnI) values and significant  changes across serial measurements may suggest ACS but many other  chronic and acute conditions are known to elevate hsTnI results.  Refer to the "Links" section for chest pain algorithms and additional  guidance. Performed at [facility], [address] [place], [place] [address]   . Sodium 03/18/2021 133* 135 - 145 mmol/L Final  . Potassium 03/18/2021 3.5  3.5 - 5.1 mmol/L Final  . Chloride 03/18/2021 99  98 - 111 mmol/L Final  . CO2 03/18/2021 21* 22 - 32  mmol/L Final  . Glucose, Bld 03/18/2021 113* 70 - 99 mg/dL Final   Glucose reference range applies only to samples taken after fasting for at least 8 hours.  . BUN 03/18/2021 13  6 - 20 mg/dL  Final  . Creatinine, Ser 03/18/2021 0.94  0.61 - 1.24 mg/dL Final  . Calcium 36/64/4034 9.4  8.9 - 10.3 mg/dL Final  . Total Protein 03/18/2021 8.5* 6.5 - 8.1 g/dL Final  . Albumin 74/25/9563 4.7  3.5 - 5.0 g/dL Final  . AST 87/56/4332 31  15 - 41 U/L Final  . ALT 03/18/2021 39  0 - 44 U/L Final  . Alkaline Phosphatase 03/18/2021 80  38 - 126 U/L Final  . Total Bilirubin 03/18/2021 0.9  0.3 - 1.2 mg/dL Final  . GFR, Estimated 03/18/2021 >60  >60 mL/min Final   Comment: (NOTE) Calculated using the CKD-EPI Creatinine Equation (2021)   . Anion gap 03/18/2021 13  5 - 15 Final   Performed at [facility], [address] [place], [place] [address]  . WBC 03/18/2021 14.1* 4.0 - 10.5 K/uL Final  . RBC 03/18/2021 5.15  4.22 - 5.81 MIL/uL Final  . Hemoglobin 03/18/2021 16.2  13.0 - 17.0 g/dL Final  . HCT 41/66/0630 47.7  39.0 - 52.0 % Final  . MCV 03/18/2021 92.6  80.0 - 100.0 fL Final  . MCH 03/18/2021 31.5  26.0 - 34.0 pg Final  . MCHC 03/18/2021 34.0  30.0 - 36.0 g/dL Final  . RDW 16/12/930 12.9  11.5 - 15.5 % Final  . Platelets 03/18/2021 284  150 - 400 K/uL Final  . nRBC 03/18/2021 0.0  0.0 - 0.2 % Final  . Neutrophils Relative % 03/18/2021 73  % Final  . Neutro Abs 03/18/2021 10.4* 1.7 - 7.7 K/uL Final  . Lymphocytes Relative 03/18/2021 17  % Final  . Lymphs Abs 03/18/2021 2.4  0.7 - 4.0 K/uL Final  . Monocytes Relative 03/18/2021 7  % Final  . Monocytes Absolute 03/18/2021 1.1* 0.1 - 1.0 K/uL Final  . Eosinophils Relative 03/18/2021 1  % Final  . Eosinophils Absolute 03/18/2021 0.1  0.0 - 0.5 K/uL Final  . Basophils Relative 03/18/2021 1  % Final  . Basophils Absolute 03/18/2021 0.1  0.0 - 0.1 K/uL Final  . Immature Granulocytes 03/18/2021 1  % Final  . Abs Immature Granulocytes  03/18/2021 0.07  0.00 - 0.07 K/uL Final   Performed at [facility], [address] [place], [place] [address]  Admission on 03/16/2021, Discharged on 03/16/2021  Component Date Value Ref Range Status  . Sodium 03/16/2021 132* 135 - 145 mmol/L Final  . Potassium 03/16/2021 3.5  3.5 - 5.1 mmol/L Final  . Chloride 03/16/2021 100  98 - 111 mmol/L Final  . CO2 03/16/2021 21* 22 - 32 mmol/L Final  . Glucose, Bld 03/16/2021 96  70 - 99 mg/dL Final   Glucose reference range applies only to samples taken after fasting for at least 8 hours.  . BUN 03/16/2021 12  6 - 20 mg/dL Final  . Creatinine, Ser 03/16/2021 1.08  0.61 - 1.24 mg/dL Final  . Calcium 22/01/5426 9.1  8.9 - 10.3 mg/dL Final  . Total Protein 03/16/2021 7.7  6.5 - 8.1 g/dL Final  . Albumin 05/25/7627 4.5  3.5 - 5.0 g/dL Final  . AST 31/51/7616 37  15 - 41 U/L Final  . ALT 03/16/2021 41  0 - 44 U/L Final  . Alkaline Phosphatase 03/16/2021 77  38 - 126 U/L Final  . Total Bilirubin 03/16/2021 0.7  0.3 - 1.2 mg/dL Final  . GFR, Estimated 03/16/2021 >60  >60 mL/min Final   Comment: (NOTE) Calculated using the CKD-EPI Creatinine  Equation (2021)   . Anion gap 03/16/2021 11  5 - 15 Final   Performed at [facility], [address] [place], [place] [address]  . Troponin I (High Sensitivity) 03/16/2021 4  <18 ng/L Final   Comment: (NOTE) Elevated high sensitivity troponin I (hsTnI) values and significant  changes across serial measurements may suggest ACS but many other  chronic and acute conditions are known to elevate hsTnI results.  Refer to the "Links" section for chest pain algorithms and additional  guidance. Performed at [facility], [address] [place], [place] [address]   . WBC 03/16/2021 15.4* 4.0 - 10.5 K/uL Final  . RBC 03/16/2021 5.04  4.22 - 5.81 MIL/uL Final  . Hemoglobin 03/16/2021 15.8  13.0 - 17.0 g/dL Final  . HCT 09/81/1914 47.0  39.0 - 52.0 % Final  . MCV 03/16/2021 93.3   80.0 - 100.0 fL Final  . MCH 03/16/2021 31.3  26.0 - 34.0 pg Final  . MCHC 03/16/2021 33.6  30.0 - 36.0 g/dL Final  . RDW 78/29/5621 13.1  11.5 - 15.5 % Final  . Platelets 03/16/2021 264  150 - 400 K/uL Final  . nRBC 03/16/2021 0.0  0.0 - 0.2 % Final  . Neutrophils Relative % 03/16/2021 73  % Final  . Neutro Abs 03/16/2021 11.3* 1.7 - 7.7 K/uL Final  . Lymphocytes Relative 03/16/2021 15  % Final  . Lymphs Abs 03/16/2021 2.4  0.7 - 4.0 K/uL Final  . Monocytes Relative 03/16/2021 9  % Final  . Monocytes Absolute 03/16/2021 1.4* 0.1 - 1.0 K/uL Final  . Eosinophils Relative 03/16/2021 1  % Final  . Eosinophils Absolute 03/16/2021 0.2  0.0 - 0.5 K/uL Final  . Basophils Relative 03/16/2021 1  % Final  . Basophils Absolute 03/16/2021 0.1  0.0 - 0.1 K/uL Final  . Immature Granulocytes 03/16/2021 1  % Final  . Abs Immature Granulocytes 03/16/2021 0.08* 0.00 - 0.07 K/uL Final   Performed at [facility], [address] [address] [place], [place] [address]  . Alcohol, Ethyl (B) 03/16/2021 <10  <10 mg/dL Final   Comment:        LOWEST DETECTABLE LIMIT FOR SERUM ALCOHOL IS 10 mg/dL FOR MEDICAL PURPOSES ONLY (NOTE) Lowest detectable limit for serum alcohol is 10 mg/dL.  For medical purposes only. Performed at [facility], [address] [place], [place] [address]   . Troponin I (High Sensitivity) 03/16/2021 5  <18 ng/L Final   Comment: (NOTE) Elevated high sensitivity troponin I (hsTnI) values and significant  changes across serial measurements may suggest ACS but many other  chronic and acute conditions are known to elevate hsTnI results.  Refer to the "Links" section for chest pain algorithms and additional  guidance. Performed at [facility], [address] [place], [place] [address]     Allergies: Peanut-containing drug products  PTA Medications: (Not in a hospital admission)   Medical Decision Making  Admission orders placed  Increase gabapentin  to 300 mg BID for alcohol use disorder Continue paroxetine 20 mg daily for depression  Initiate CIWA protocol -librium 25 every 6 hours prn for CIWA >10 -thiamine 100 mg daily for nutritional supplementation -hydroxyzine 25 mg every 6 hours prn for anxiety, CIWA < or = 10 -ondansetron 4 mg ODT every 6 hours prn nausea/vomiting -loperamide 2-4 mg capsule prn diarrhea or loose stools      Recommendations  Based  on my evaluation the patient does not appear to have an emergency medical condition.   Patient is interested in detox and substance abuse treatment.   Jackelyn Poling, NP 04/13/21  5:55 AM

## 2021-04-13 NOTE — ED Notes (Signed)
Safe transport called for transportation services to take Pt home to [address], [place].

## 2021-04-13 NOTE — ED Notes (Signed)
Rechecked vital signs. Pt states he typically drinks "12 beers a day" and last drank yesterday. Pt states he "feels shakey and sweating." Nira Conn, NP made aware of pt's vital signs and reported symptoms.

## 2021-04-13 NOTE — ED Notes (Signed)
Patient denies pain and is resting comfortably.  

## 2021-04-13 NOTE — ED Notes (Signed)
Discharge instructions provided and Pt stated understanding. Pt escorted to the sally port for [employer]. Pt alert, orient and ambulatory. Safety maintained.

## 2021-04-13 NOTE — ED Provider Notes (Signed)
FBC/OBS ASAP Discharge Summary  Date and Time: 04/13/2021 1:45 PM  Name: Carlos Flowers  MRN:  [id number]   Discharge Diagnoses:  Final diagnoses:  Alcohol use disorder, severe, dependence (HCC)  MDD (major depressive disorder), recurrent severe, without psychosis (HCC)   Shepard General 40 year old Caucasian male was seen and evaluated face-to-face.  Patient is requesting to follow-up with a detox facility program.  CSW to follow-up with additional outpatient resources.  Reports [facility] has not responded back to her request.  Patient reports his wife is seeking services and and is requesting transportation back to [place].  Support,encouragement and  reassurance was provided.  Per admission assessment note: Carlos Flowers is a 40 y.o. male with a history of alcohol use disorder and MDD who presents to [facility] voluntarily with law enforcement due to SI and requests for alcohol detox. On evaluation patient is alert and oriented x 4, pleasant, and cooperative. Speech is clear and coherent. Patient reports that he has been drinking a case of beer daily. States that he started drinking at the age of 64 and the amount he drinks has graudally increased. Reports that his last drink was yesterday. He denies a history of withdrawal seizures. He reports tremors. Noted mild distal tremor on assessment. He reports his mood as depressed and affect is congruent with mood. Thought process is coherent and thought content is logical. Denies auditory and hallucinations. No indication that patient is responding to internal stimuli. No evidence of delusional thought content. Reports suicidal thoughts. He initially denied a plan, but later states that he will cut his wrists if discharged. Denies a history of suicide attempts. Denies homicidal ideations.     Total Time spent with patient: 15 minutes  Past Psychiatric History:  Past Medical History:  Past Medical History:  Diagnosis Date  . Alcoholism (HCC)   . Anxiety    . Depression   . Substance abuse (HCC)    No past surgical history on file. Family History:  Family History  Problem Relation Age of Onset  . Cancer Maternal Grandmother   . Heart disease Paternal Grandmother   . Heart disease Paternal Grandfather    Family Psychiatric History:  Social History:  Social History   Substance and Sexual Activity  Alcohol Use Yes  . Alcohol/week: 10.0 - 12.0 standard drinks  . Types: 10 - 12 Cans of beer per week     Social History   Substance and Sexual Activity  Drug Use Not Currently  . Types: Cocaine, Heroin   Comment: last use 12/17/2018    Social History   Socioeconomic History  . Marital status: Single    Spouse name: Not on file  . Number of children: Not on file  . Years of education: Not on file  . Highest education level: Not on file  Occupational History  . Not on file  Tobacco Use  . Smoking status: Current Every Day Smoker    Packs/day: 2.00    Years: 17.00    Pack years: 34.00    Types: Cigarettes  . Smokeless tobacco: Never Used  Vaping Use  . Vaping Use: Never used  Substance and Sexual Activity  . Alcohol use: Yes    Alcohol/week: 10.0 - 12.0 standard drinks    Types: 10 - 12 Cans of beer per week  . Drug use: Not Currently    Types: Cocaine, Heroin    Comment: last use 12/17/2018  . Sexual activity: Not Currently  Other Topics Concern  . Not  on file  Social History Narrative  . Not on file   Social Determinants of Health   Financial Resource Strain: Not on file  Food Insecurity: Not on file  Transportation Needs: Not on file  Physical Activity: Not on file  Stress: Not on file  Social Connections: Not on file   SDOH:  SDOH Screenings   Alcohol Screen: Medium Risk  . Last Alcohol Screening Score (AUDIT): 30  Depression (PHQ2-9): Medium Risk  . PHQ-2 Score: 18  Financial Resource Strain: Not on file  Food Insecurity: Not on file  Housing: Not on file  Physical Activity: Not on file  Social  Connections: Not on file  Stress: Not on file  Tobacco Use: High Risk  . Smoking Tobacco Use: Current Every Day Smoker  . Smokeless Tobacco Use: Never Used  Transportation Needs: Not on file    Has this patient used any form of tobacco in the last 30 days? (Cigarettes, Smokeless Tobacco, Cigars, and/or Pipes) A prescription for an FDA-approved tobacco cessation medication was offered at discharge and the patient refused  Current Medications:  Current Facility-Administered Medications  Medication Dose Route Frequency Provider Last Rate Last Admin  . acetaminophen (TYLENOL) tablet 650 mg  650 mg Oral Q6H PRN Nira Conn A, NP      . alum & mag hydroxide-simeth (MAALOX/MYLANTA) 200-200-20 MG/5ML suspension 30 mL  30 mL Oral Q4H PRN Nira Conn A, NP      . chlordiazePOXIDE (LIBRIUM) capsule 25 mg  25 mg Oral Q6H PRN Nira Conn A, NP   25 mg at 04/13/21 1108  . gabapentin (NEURONTIN) capsule 300 mg  300 mg Oral BID Nira Conn A, NP   300 mg at 04/13/21 5597  . hydrOXYzine (ATARAX/VISTARIL) tablet 25 mg  25 mg Oral TID PRN Nira Conn A, NP      . loperamide (IMODIUM) capsule 2-4 mg  2-4 mg Oral PRN Nira Conn A, NP      . magnesium hydroxide (MILK OF MAGNESIA) suspension 30 mL  30 mL Oral Daily PRN Nira Conn A, NP      . nicotine polacrilex (NICORETTE) gum 2 mg  2 mg Oral Q4H PRN Nira Conn A, NP   2 mg at 04/13/21 0914  . ondansetron (ZOFRAN-ODT) disintegrating tablet 4 mg  4 mg Oral Q6H PRN Jackelyn Poling, NP      . PARoxetine (PAXIL) tablet 20 mg  20 mg Oral Daily Nira Conn A, NP   20 mg at 04/13/21 0914  . [START ON 04/14/2021] thiamine tablet 100 mg  100 mg Oral Daily Jackelyn Poling, NP       Current Outpatient Medications  Medication Sig Dispense Refill  . cloNIDine (CATAPRES) 0.1 MG tablet Take 0.1 mg by mouth at bedtime.    Marland Kitchen PARoxetine (PAXIL) 20 MG tablet Take 20 mg by mouth daily.    Marland Kitchen gabapentin (NEURONTIN) 600 MG tablet Take 600 mg by mouth 4 (four) times daily.     . hydrOXYzine (ATARAX/VISTARIL) 25 MG tablet Take 1 tablet (25 mg total) by mouth 3 (three) times daily as needed for anxiety. 30 tablet 0    PTA Medications: (Not in a hospital admission)   Musculoskeletal  Strength & Muscle Tone: within normal limits Gait & Station: normal Patient leans: N/A  Psychiatric Specialty Exam  Presentation  General Appearance: Appropriate for Environment; Casual; Fairly Groomed  Eye Contact:Good  Speech:Clear and Coherent; Normal Rate  Speech Volume:Normal  Handedness:Right   Mood and  Affect  Mood:Depressed; Hopeless; Worthless  Affect:Congruent   Thought Process  Thought Processes:Coherent; Linear  Descriptions of Associations:Intact  Orientation:Full (Time, Place and Person)  Thought Content:WDL  Diagnosis of Schizophrenia or Schizoaffective disorder in past: No    Hallucinations:Hallucinations: None  Ideas of Reference:None  Suicidal Thoughts:Suicidal Thoughts: Yes, Active SI Active Intent and/or Plan: Without Intent; Without Plan  Homicidal Thoughts:Homicidal Thoughts: No   Sensorium  Memory:Immediate Good; Recent Good; Remote Good  Judgment:Fair  Insight:Fair   Executive Functions  Concentration:Good  Attention Span:Good  Recall:Good  Fund of Knowledge:Good  Language:Good   Psychomotor Activity  Psychomotor Activity:Psychomotor Activity: Normal   Assets  Assets:Communication Skills; Desire for Improvement   Sleep  Sleep:Sleep: Fair   Nutritional Assessment (For OBS and FBC admissions only) Has the patient had a weight loss or gain of 10 pounds or more in the last 3 months?: No Has the patient had a decrease in food intake/or appetite?: No Does the patient have dental problems?: No Does the patient have eating habits or behaviors that may be indicators of an eating disorder including binging or inducing vomiting?: No Has the patient recently lost weight without trying?: No Has the patient been  eating poorly because of a decreased appetite?: No Malnutrition Screening Tool Score: 0    Physical Exam  Physical Exam Vitals reviewed.  Cardiovascular:     Rate and Rhythm: Normal rate and regular rhythm.  Neurological:     Mental Status: He is alert.  Psychiatric:        Attention and Perception: Attention normal.        Mood and Affect: Mood normal.        Speech: Speech normal.        Behavior: Behavior normal.        Thought Content: Thought content normal.        Cognition and Memory: Cognition normal.        Judgment: Judgment normal.    Review of Systems  Psychiatric/Behavioral: Positive for depression and substance abuse. The patient is nervous/anxious.   All other systems reviewed and are negative.  Blood pressure (!) 131/101, pulse 99, temperature 97.7 F (36.5 C), temperature source Tympanic, resp. rate 16, SpO2 99 %. There is no height or weight on file to calculate BMI.  Demographic Factors:  Male and Caucasian  Loss Factors: Decrease in vocational status and Financial problems/change in socioeconomic status  Historical Factors: Family history of mental illness or substance abuse  Risk Reduction Factors:   NA  Continued Clinical Symptoms:  Depression:   Comorbid alcohol abuse/dependence Alcohol/Substance Abuse/Dependencies  Cognitive Features That Contribute To Risk:  Closed-mindedness    Suicide Risk:  Minimal: No identifiable suicidal ideation.  Patients presenting with no risk factors but with morbid ruminations; may be classified as minimal risk based on the severity of the depressive symptoms  Plan Of Care/Follow-up recommendations:  Activity:  as tolerated Diet:  heart healthy  Disposition: Take all medications as prescribed. Keep all follow-up appointments as scheduled.  Do not consume alcohol or use illegal drugs while on prescription medications. Report any adverse effects from your medications to your primary care provider promptly.   In the event of recurrent symptoms or worsening symptoms, call 911, a crisis hotline, or go to the nearest emergency department for evaluation.   Oneta Rack, NP 04/13/2021, 1:45 PM

## 2021-04-13 NOTE — BH Assessment (Signed)
Pt consented for clinician to contact his girlfriend [name], [phone]) to let her know where he is, his disposition and to gather additional information. Pt's girlfriend reported, Tuesday her brother took her out to dinner for Mother's Day and the pt stayed behind because there was no room in the car. Pt's girlfriend reported, she came back home, the door was open, lights on and the pt was gone. Pt's girlfriend, the pt has anxiety and panic attacks. Per girlfriend, she called hospitals, she called [facility] and learned the pt was there. Pt's girlfriend reported, the pt needs her, as long as she's with him, he's calm. Pt's girlfriend denies, the pt has suicidal ideations but denies previous attempts. Pt's girlfriend, continued to say she wanted to come pick up the pt. Clinician explained the process a few times to pt's girlfriend, the pt will be reassessed by psychiatry in the morning and another disposition will be made, if discharged the pt will be able to call. Pt's girlfriend asked to talk the pt, clinician expressed at this time the pt is unable to talk. Pt's girlfriend wants the let the pt know she loves.   Clinician expressed to the pt that hs girlfriend loves him and wants to pick him up. Clinician added that she wants him call her.    Redmond Pulling, MS, [facility], [facility] Triage Specialist [phone]

## 2021-04-13 NOTE — Discharge Instructions (Addendum)
Take all medications as prescribed. Keep all follow-up appointments as scheduled.  Do not consume alcohol or use illegal drugs while on prescription medications. Report any adverse effects from your medications to your primary care provider promptly.  In the event of recurrent symptoms or worsening symptoms, call 911, a crisis hotline, or go to the nearest emergency department for evaluation.   

## 2021-04-16 ENCOUNTER — Telehealth (HOSPITAL_COMMUNITY): Payer: Self-pay | Admitting: Emergency Medicine

## 2021-04-16 NOTE — BH Assessment (Signed)
Care Management - Follow Up [facility] Discharges   Writer attempted to make contact with patient today and was unsuccessful.  Writer was able to leave a HIPPA compliant voice message and will await callback.   

## 2021-07-13 ENCOUNTER — Encounter (HOSPITAL_COMMUNITY): Payer: Self-pay

## 2021-07-13 ENCOUNTER — Emergency Department (HOSPITAL_COMMUNITY)
Admission: EM | Admit: 2021-07-13 | Discharge: 2021-07-15 | Disposition: A | Discharge status: Home / Self Care | Payer: Self-pay | Attending: Emergency Medicine | Admitting: Emergency Medicine

## 2021-07-13 ENCOUNTER — Other Ambulatory Visit: Payer: Self-pay

## 2021-07-13 DIAGNOSIS — F10139 Alcohol abuse with withdrawal, unspecified: Secondary | ICD-10-CM | POA: Insufficient documentation

## 2021-07-13 DIAGNOSIS — R45851 Suicidal ideations: Secondary | ICD-10-CM | POA: Insufficient documentation

## 2021-07-13 DIAGNOSIS — F17210 Nicotine dependence, cigarettes, uncomplicated: Secondary | ICD-10-CM | POA: Insufficient documentation

## 2021-07-13 DIAGNOSIS — Z87898 Personal history of other specified conditions: Secondary | ICD-10-CM

## 2021-07-13 DIAGNOSIS — Z20822 Contact with and (suspected) exposure to covid-19: Secondary | ICD-10-CM | POA: Insufficient documentation

## 2021-07-13 LAB — RAPID URINE DRUG SCREEN, HOSP PERFORMED
Amphetamines: NOT DETECTED
Barbiturates: NOT DETECTED
Benzodiazepines: POSITIVE — AB
Cocaine: NOT DETECTED
Opiates: NOT DETECTED
Tetrahydrocannabinol: NOT DETECTED

## 2021-07-13 LAB — COMPREHENSIVE METABOLIC PANEL
ALT: 32 U/L (ref 0–44)
AST: 27 U/L (ref 15–41)
Albumin: 4.9 g/dL (ref 3.5–5.0)
Alkaline Phosphatase: 84 U/L (ref 38–126)
Anion gap: 9 (ref 5–15)
BUN: 9 mg/dL (ref 6–20)
CO2: 24 mmol/L (ref 22–32)
Calcium: 9.5 mg/dL (ref 8.9–10.3)
Chloride: 103 mmol/L (ref 98–111)
Creatinine, Ser: 0.87 mg/dL (ref 0.61–1.24)
GFR, Estimated: >60 mL/min (ref >60)
Glucose, Bld: 96 mg/dL (ref 70–99)
Potassium: 4.4 mmol/L (ref 3.5–5.1)
Sodium: 136 mmol/L (ref 135–145)
Total Bilirubin: 0.9 mg/dL (ref 0.3–1.2)
Total Protein: 8.2 g/dL — ABNORMAL HIGH (ref 6.5–8.1)

## 2021-07-13 LAB — CBC WITH DIFFERENTIAL/PLATELET
Abs Immature Granulocytes: 0.04 10*3/uL (ref 0.00–0.07)
Basophils Absolute: 0.1 10*3/uL (ref 0.0–0.1)
Basophils Relative: 1 %
Eosinophils Absolute: 0.2 10*3/uL (ref 0.0–0.5)
Eosinophils Relative: 2 %
HCT: 49.5 % (ref 39.0–52.0)
Hemoglobin: 16.1 g/dL (ref 13.0–17.0)
Immature Granulocytes: 0 %
Lymphocytes Relative: 19 %
Lymphs Abs: 2 10*3/uL (ref 0.7–4.0)
MCH: 29.8 pg (ref 26.0–34.0)
MCHC: 32.5 g/dL (ref 30.0–36.0)
MCV: 91.7 fL (ref 80.0–100.0)
Monocytes Absolute: 0.7 10*3/uL (ref 0.1–1.0)
Monocytes Relative: 6 %
Neutro Abs: 7.9 10*3/uL — ABNORMAL HIGH (ref 1.7–7.7)
Neutrophils Relative %: 72 %
Platelets: 273 10*3/uL (ref 150–400)
RBC: 5.4 MIL/uL (ref 4.22–5.81)
RDW: 13.7 % (ref 11.5–15.5)
WBC: 10.8 10*3/uL — ABNORMAL HIGH (ref 4.0–10.5)
nRBC: 0 % (ref 0.0–0.2)

## 2021-07-13 LAB — ETHANOL: Alcohol, Ethyl (B): <10 mg/dL (ref <10)

## 2021-07-13 LAB — RESP PANEL BY RT-PCR (FLU A&B, COVID) ARPGX2
Influenza A by PCR: NEGATIVE
Influenza B by PCR: NEGATIVE
SARS Coronavirus 2 by RT PCR: NEGATIVE

## 2021-07-13 MED ORDER — NICOTINE 21 MG/24HR TD PT24
21.0000 mg | MEDICATED_PATCH | Freq: Every day | TRANSDERMAL | Status: DC
  Administered 2021-07-13 – 2021-07-15 (×3): 21 mg via TRANSDERMAL
  Filled 2021-07-13 (×3): qty 1

## 2021-07-13 MED ORDER — ACETAMINOPHEN 325 MG PO TABS
650.0000 mg | ORAL_TABLET | Freq: Four times a day (QID) | ORAL | Status: DC | PRN
  Administered 2021-07-13 – 2021-07-15 (×4): 650 mg via ORAL
  Filled 2021-07-13 (×4): qty 2

## 2021-07-13 MED ORDER — LOPERAMIDE HCL 2 MG PO CAPS: 4.0000 mg | ORAL_CAPSULE | Freq: Four times a day (QID) | ORAL | Status: DC | PRN

## 2021-07-13 MED ORDER — LORAZEPAM 1 MG PO TABS
1.0000 mg | ORAL_TABLET | Freq: Four times a day (QID) | ORAL | Status: DC | PRN
  Administered 2021-07-13 – 2021-07-15 (×7): 1 mg via ORAL
  Filled 2021-07-13 (×7): qty 1

## 2021-07-13 NOTE — ED Triage Notes (Signed)
Pt BIB [facility]. Per [facility] pt was at a alcoholic tx facility and told staff that he was SI and his thoughts had been getting worse over the last 24 hours. Staff IVCd pt and had [facility] to bring him to the ED.

## 2021-07-13 NOTE — BH Assessment (Signed)
@  1253, Requested staff to place the TTS machine in patient's room.

## 2021-07-13 NOTE — BH Assessment (Signed)
@  [id number], Nursing to notify TTS when patient is ready to be seen.

## 2021-07-13 NOTE — ED Notes (Signed)
Patient was given his lunch tray.

## 2021-07-13 NOTE — ED Provider Notes (Signed)
[facility]-EMERGENCY DEPT Provider Note   CSN: [id number] Arrival date & time: 07/13/21  1119     History Chief Complaint  Patient presents with   Suicidal    Carlos Flowers is a 40 y.o. male.  Patient with history of alcoholism, currently at [facility] for the past 1 week presents the emergency department under involuntary commitment for worsening suicidal ideations.  Patient denies a particular plan but states that his suicidal thoughts have been getting worse.  He states like he does not have a reason to live.  He does report continued alcohol withdrawal symptoms.  This includes extreme anxiety and diarrhea.  No chest pain or shortness of breath.  Denies any infectious symptoms.  Accompanied by [facility]. The onset of this condition was acute. The course is constant. Aggravating factors: none. Alleviating factors: none.        Past Medical History:  Diagnosis Date   Alcoholism (HCC)    Anxiety    Depression    Substance abuse [facility])     Patient Active Problem List   Diagnosis Date Noted   MDD (major depressive disorder), recurrent severe, without psychosis (HCC) 03/22/2021   Suicidal ideation 03/21/2021   Alcohol-induced mood disorder (HCC)     History reviewed. No pertinent surgical history.     Family History  Problem Relation Age of Onset   Cancer Maternal Grandmother    Heart disease Paternal Grandmother    Heart disease Paternal Grandfather     Social History   Tobacco Use   Smoking status: Every Day    Packs/day: 2.00    Years: 17.00    Pack years: 34.00    Types: Cigarettes   Smokeless tobacco: Never  Vaping Use   Vaping Use: Never used  Substance Use Topics   Alcohol use: Yes    Alcohol/week: 10.0 - 12.0 standard drinks    Types: 10 - 12 Cans of beer per week   Drug use: Not Currently    Types: Cocaine, Heroin    Comment: last use 12/17/2018    Home Medications Prior to Admission medications   Medication Sig  Start Date End Date Taking? Authorizing Provider  cloNIDine (CATAPRES) 0.1 MG tablet Take 0.1 mg by mouth at bedtime.    [provider]  gabapentin (NEURONTIN) 600 MG tablet Take 600 mg by mouth 4 (four) times daily.    [provider]  hydrOXYzine (ATARAX/VISTARIL) 25 MG tablet Take 1 tablet (25 mg total) by mouth 3 (three) times daily as needed for anxiety. 03/27/21   Laveda Abbe, NP  PARoxetine (PAXIL) 20 MG tablet Take 20 mg by mouth daily. 03/15/21   [provider]    Allergies    Peanut-containing drug products  Review of Systems   Review of Systems  Constitutional:  Negative for fever.  HENT:  Negative for rhinorrhea and sore throat.   Eyes:  Negative for redness.  Respiratory:  Negative for cough and shortness of breath.   Cardiovascular:  Negative for chest pain.  Gastrointestinal:  Positive for diarrhea. Negative for abdominal pain, nausea and vomiting.  Genitourinary:  Negative for dysuria and hematuria.  Musculoskeletal:  Negative for myalgias.  Skin:  Negative for rash.  Neurological:  Negative for headaches.  Psychiatric/Behavioral:  Positive for suicidal ideas. The patient is nervous/anxious.    Physical Exam Updated Vital Signs BP (!) 140/101 (BP Location: Right Arm)   Pulse 94   Temp 97.9 F (36.6 C) (Oral)  Resp 16   Ht 6' (1.829 m)   Wt 43.5 kg   SpO2 95%   BMI 13.02 kg/m   Physical Exam Vitals and nursing note reviewed.  Constitutional:      General: He is not in acute distress.    Appearance: He is well-developed.  HENT:     Head: Normocephalic and atraumatic.  Eyes:     General:        Right eye: No discharge.        Left eye: No discharge.     Conjunctiva/sclera: Conjunctivae normal.  Cardiovascular:     Rate and Rhythm: Normal rate and regular rhythm.     Heart sounds: Normal heart sounds.  Pulmonary:     Effort: Pulmonary effort is normal.     Breath sounds: Normal breath sounds.  Abdominal:      Palpations: Abdomen is soft.     Tenderness: There is no abdominal tenderness.  Musculoskeletal:     Cervical back: Normal range of motion and neck supple.  Skin:    General: Skin is warm and dry.  Neurological:     Mental Status: He is alert.  Psychiatric:        Attention and Perception: He does not perceive auditory or visual hallucinations.        Mood and Affect: Mood is depressed.        Thought Content: Thought content is not paranoid. Thought content includes suicidal ideation. Thought content does not include homicidal ideation. Thought content does not include suicidal plan.    ED Results / Procedures / Treatments   Labs (all labs ordered are listed, but only abnormal results are displayed) Labs Reviewed  COMPREHENSIVE METABOLIC PANEL - Abnormal; Notable for the following components:      Result Value   Total Protein 8.2 (*)    All other components within normal limits  RAPID URINE DRUG SCREEN, HOSP PERFORMED - Abnormal; Notable for the following components:   Benzodiazepines POSITIVE (*)    All other components within normal limits  CBC WITH DIFFERENTIAL/PLATELET - Abnormal; Notable for the following components:   WBC 10.8 (*)    Neutro Abs 7.9 (*)    All other components within normal limits  RESP PANEL BY RT-PCR (FLU A&B, COVID) ARPGX2  ETHANOL    EKG None  Radiology No results found.  Procedures Procedures   Medications Ordered in ED Medications  loperamide (IMODIUM) capsule 4 mg (has no administration in time range)  LORazepam (ATIVAN) tablet 1 mg (1 mg Oral Given 07/13/21 1226)  nicotine (NICODERM CQ - dosed in mg/24 hours) patch 21 mg (21 mg Transdermal Patch Applied 07/13/21 1226)    ED Course  I have reviewed the triage vital signs and the nursing notes.  Pertinent labs & imaging results that were available during my care of the patient were reviewed by me and considered in my medical decision making (see chart for details).  Patient seen and  examined. Work-up initiated.   Vital signs reviewed and are as follows: BP (!) 140/101 (BP Location: Right Arm)   Pulse 94   Temp 97.9 F (36.6 C) (Oral)   Resp 16   Ht 6' (1.829 m)   Wt 43.5 kg   SpO2 95%   BMI 13.02 kg/m   Patient is medically cleared.  Pending TTS evaluation.     MDM Rules/Calculators/A&P  Pending TTS evaluation.   Final Clinical Impression(s) / ED Diagnoses Final diagnoses:  History of alcohol use disorder  Suicidal ideation    Rx / DC Orders ED Discharge Orders     None        Renne Crigler, PA-C 07/13/21 1503    Pricilla Loveless, MD 07/17/21 [phone]

## 2021-07-13 NOTE — ED Notes (Signed)
Patient is currently in TTS. Large blue duffle bag with sticker placed on it under desk at 23-25. Cell phone placed into blue duffle bag

## 2021-07-13 NOTE — BH Assessment (Signed)
@  1327, Calling TTS cart. No answer.

## 2021-07-14 MED ORDER — FLUOXETINE HCL 10 MG PO CAPS
10.0000 mg | ORAL_CAPSULE | Freq: Every day | ORAL | Status: DC
Start: 2021-07-14 — End: 2021-07-15
  Administered 2021-07-14: 10 mg via ORAL
  Filled 2021-07-14: qty 1

## 2021-07-14 MED ORDER — GABAPENTIN 100 MG PO CAPS
100.0000 mg | ORAL_CAPSULE | Freq: Two times a day (BID) | ORAL | Status: DC
  Administered 2021-07-14 – 2021-07-15 (×3): 100 mg via ORAL
  Filled 2021-07-14 (×3): qty 1

## 2021-07-14 NOTE — Progress Notes (Signed)
Per Vivien Presto, patient meets criteria for inpatient treatment. There are no available or appropriate beds at [facility] today. CSW faxed referrals to the following facilities for review:  [facility]  Pending - No Request Sent N/A [address] [place] [place] [address] [phone] --  [facility]  Pending - No Request Sent N/A [address] [place] [address] [phone] --  [facility]  Pending - No Request Sent N/A [address], [place] [address] [phone] --  [facility]  Pending - No Request Sent N/A [address] [place] [address] [phone] --  [facility]  Pending - No Request Sent N/A [address] [place], [place] [address] [phone] --  [facility]  Pending - No Request Sent N/A [address] [place] [address] [phone] --  [facility]  Pending - No Request Sent N/A [address] [place] [address] [phone] --  [facility]  Pending - No Request Sent N/A [address] [place] [address] [phone] --  [facility]  Pending - No Request Sent N/A [address] [place] [address] [phone] --  [facility]  Pending - No Request Sent N/A [name] [place] [address] [phone] --  [facility]  Pending - No Request Sent N/A [address] [place] [address] [phone] --  [facility]  Pending - No Request Sent N/A [address] [place] [address] [phone] --  [facility]  Pending - No Request Sent N/A [address] [place]  [place] [address] [phone] --  [facility]  Pending - No Request Sent N/A [address] [place], [place] [address] [phone] --  [facility]  Pending - No Request Sent N/A [address] [place] [address] [phone] --  [facility]  Pending - No Request Sent N/A [address], [place] [address] [phone] --   TTS will continue to seek bed placement.  Crissie Reese, MSW, LCSW-A, LCAS-A Phone: [phone] Disposition/TOC

## 2021-07-14 NOTE — BH Assessment (Signed)
Clinician made contact with pt's team at 0449 in an effort to complete pt's MH Assessment. At 0509, pt's nurse, Aniceto Boss, replied that pt is currently sleeping, thus unable to participate in the assessment at this time. Clinician requested pt's nurse contact her if pt awakens and is able to participate.

## 2021-07-14 NOTE — BH Assessment (Signed)
Comprehensive Clinical Assessment (CCA) Note  07/14/2021 Carlos Flowers [id number]  DISPOSITION: Arvilla Market NP recommended a inpatient admission to assist with stabilization.   Flowsheet Row ED from 07/13/2021 in [place]  HOSPITAL-EMERGENCY DEPT ED from 04/13/2021 in [facility] (Discharged[facility]  C-SSRS RISK CATEGORY High Risk Error: Q7 should not be populated when Q6 is No High Risk      The patient demonstrates the following risk factors for suicide: Chronic risk factors for suicide include: psychiatric disorder of depression   . Acute risk factors for suicide include: family or marital conflict. Protective factors for this patient include: coping skills. Considering these factors, the overall suicide risk at this point appears to be moderate. Patient is not appropriate for outpatient follow up.   Carlos Flowers is a 40 year old male who presents with IVC from [facility] with ongoing S/I. Patient is vague in reference to plan or intent. Per IVC patient made statements to self harm. Patient denies any H/I or AVH. Patient states he has been at [facility] for the last week and became suicidal after having a panic attack. Patient renders limited history in reference to precipitating events that occurred prior to arrival.   Patient reports daily alcohol use (24 beers daily) for the last five years until one week ago when he entered the program at [facility]. Patient denies the use of any other substances although UDS is positive for Benzodiazepines. Patient denies having a current OP provider or anyone who prescribes medications for symptom management. Patient states he was diagnosed with anxiety and depression "years ago" and has not been on medications for over five years reporting he has been self medicating with alcohol. Patient has had multiple inpatient admissions  associated with mental health and SA issues and was last seen a previous inpatient admission at [facility] from 03/22/2021-03/27/2021 for depression.    Geiple PA writes on admission: Patient with history of alcoholism, currently at [facility] for the past 1 week presents the emergency department under involuntary commitment for worsening suicidal ideations.  Patient denies a particular plan but states that his suicidal thoughts have been getting worse.  He states like he does not have a reason to live.  He does report continued alcohol withdrawal symptoms.  This includes extreme anxiety and diarrhea.  No chest pain or shortness of breath.  Denies any infectious symptoms.  Accompanied by [employer]. The onset of this condition was acute. The course is constant. Aggravating factors: none. Alleviating factors: none.    Pt presents quiet, awake with normal speech. Pt's mood was depressed, hopeless, worthless. Pt's affect was congruent. Pt's thought content was appropriate to mood and circumstances. Pt's insight is fair. Pt's judgement is poor. Pt does not appear to be responding to internal stimuli.     Chief Complaint:  Chief Complaint  Patient presents with   Suicidal   Visit Diagnosis: MDD recurrent without psychotic features, severe, Alcohol abuse     CCA Screening, Triage and Referral (STR)  Patient Reported Information How did you hear about [place]? Self  What Is the Reason for Your Visit/Call Today? Ongoing S/I  How Long Has This Been Causing You Problems? <Week  What Do You Feel Would Help You the Most Today? Medication(s)   Have You Recently Had Any Thoughts About Hurting Yourself? Yes  Are You Planning to Commit Suicide/Harm Yourself At This time? Yes  Have you Recently Had Thoughts About Hurting Someone [name]? No  Are You Planning to Harm Someone at This Time? No  Explanation: No data recorded  Have You Used Any Alcohol or Drugs in the Past 24 Hours? No  How Long Ago Did You  Use Drugs or Alcohol? 0000 (Last night, exact time unknown)  What Did You Use and How Much? Two tall cans.   Do You Currently Have a Therapist/Psychiatrist? No  Name of Therapist/Psychiatrist: No data recorded  Have You Been Recently Discharged From Any Office Practice or Programs? No  Explanation of Discharge From Practice/Program: No data recorded    CCA Screening Triage Referral Assessment Type of Contact: Face-to-Face  Telemedicine Service Delivery:   Is this Initial or Reassessment? Initial Assessment  Date Telepsych consult ordered in [facility]:  03/21/21  Time Telepsych consult ordered in [facility]:  0656  Location of Assessment: [facility]  Provider Location: Other (comment) ([facility])   Collateral Involvement: None at this time   Does Patient Have a Court Appointed Legal Guardian? No data recorded Name and Contact of Legal Guardian: No data recorded If Minor and Not Living with Parent(s), Who has Custody? NA  Is CPS involved or ever been involved? Never  Is APS involved or ever been involved? Never   Patient Determined To Be At Risk for Harm To Self or Others Based on Review of Patient Reported Information or Presenting Complaint? Yes, for Self-Harm  Method: No data recorded Availability of Means: No data recorded Intent: No data recorded Notification Required: No data recorded Additional Information for Danger to Others Potential: No data recorded Additional Comments for Danger to Others Potential: No data recorded Are There Guns or Other Weapons in Your Home? No data recorded Types of Guns/Weapons: No data recorded Are These Weapons Safely Secured?                            No data recorded Who Could Verify You Are Able To Have These Secured: No data recorded Do You Have any Outstanding Charges, Pending Court Dates, Parole/Probation? No data recorded Contacted To Inform of Risk of Harm To Self or Others: Other: Comment (NA)    Does Patient Present under Involuntary  Commitment? No  IVC Papers Initial File Date: No data recorded  [place] of Residence: [place]   Patient Currently Receiving the Following Services: Not Receiving Services   Determination of Need: Emergent (2 hours)   Options For Referral: Outpatient Therapy     CCA Biopsychosocial Patient Reported Schizophrenia/Schizoaffective Diagnosis in Past: No   Strengths: Not assessed.   Mental Health Symptoms Depression:   Sleep (too much or little); Worthlessness; Hopelessness; Fatigue; Increase/decrease in appetite; Irritability; Tearfulness; Difficulty Concentrating (Isolation, guilt.)   Duration of Depressive symptoms:    Mania:   None   Anxiety:    Worrying (Panic attacks.)   Psychosis:   None   Duration of Psychotic symptoms:    Trauma:   None   Obsessions:   None   Compulsions:   None   Inattention:   Disorganized; Forgetful; Loses things   Hyperactivity/Impulsivity:   Feeling of restlessness; Fidgets with hands/feet   Oppositional/Defiant Behaviors:   Easily annoyed   Emotional Irregularity:   None   Other Mood/Personality Symptoms:  No data recorded   Mental Status Exam Appearance and self-care  Stature:   Average   Weight:   Average weight   Clothing:   Casual   Grooming:  Normal   Cosmetic use:   None   Posture/gait:   Normal   Motor activity:   Not Remarkable   Sensorium  Attention:   Normal   Concentration:   Normal   Orientation:   X5   Recall/memory:   Normal   Affect and Mood  Affect:   Congruent   Mood:   Depressed; Hopeless; Worthless   Relating  Eye contact:   Normal   Facial expression:   Depressed   Attitude toward examiner:   Cooperative   Thought and Language  Speech flow:  Normal   Thought content:   Appropriate to Mood and Circumstances   Preoccupation:   None   Hallucinations:   None   Organization:  No data recorded  [employer] of Knowledge:   Fair    Intelligence:   Average   Abstraction:   Normal   Judgement:   Poor   Reality Testing:   Distorted   Insight:   Fair   Decision Making:   Impulsive   Social Functioning  Social Maturity:   Impulsive   Social Judgement:   Heedless   Stress  Stressors:   Housing   Coping Ability:   Overwhelmed   Skill Deficits:   Decision making   Supports:   Friends/Service system     Religion: Religion/Spirituality Are You A Religious Person?: Yes  Leisure/Recreation: Leisure / Recreation Do You Have Hobbies?: Yes  Exercise/Diet: Exercise/Diet Do You Exercise?: No Have You Gained or Lost A Significant Amount of Weight in the Past Six Months?: No Do You Follow a Special Diet?: No Do You Have Any Trouble Sleeping?: Yes   CCA Employment/Education Employment/Work Situation: Employment / Work Situation Employment Situation: Unemployed Patient's Job has Been Impacted by Current Illness: No Has Patient ever Been in Equities trader?: No  Education: Education Last Grade Completed: 12 Did You Product manager?: Yes Did You Have An Individualized Education Program (IIEP): No Did You Have Any Difficulty At [employer]?: No   CCA Family/Childhood History Family and Relationship History: Family history Marital status: Single Does patient have children?: No  Childhood History:  Childhood History Did patient suffer any verbal/emotional/physical/sexual abuse as a child?: No Did patient suffer from severe childhood neglect?: No Has patient ever been sexually abused/assaulted/raped as an adolescent or adult?: No Was the patient ever a victim of a crime or a disaster?: No Witnessed domestic violence?: No Has patient been affected by domestic violence as an adult?: No  Child/Adolescent Assessment:     CCA Substance Use Alcohol/Drug Use: Alcohol / Drug Use Pain Medications: See MAR Prescriptions: See MAR Over the Counter: See MAR History of alcohol / drug use?:  Yes Longest period of sobriety (when/how long): 4 months. Negative Consequences of Use: Personal relationships, Work / Programmer, multimedia, Surveyor, quantity Withdrawal Symptoms: Change in blood pressure, DTs, Sweats (Anxiety.) Substance #1 Name of Substance 1: Alcohol 1 - Age of First Use: Pt states 17 1 - Amount (size/oz): Varies 1 - Frequency: varies 1 - Duration: Ongoing 1 - Last Use / Amount: 5 days ago                       ASAM's:  Six Dimensions of Multidimensional Assessment  Dimension 1:  Acute Intoxication and/or Withdrawal Potential:   Dimension 1:  Description of individual's past and current experiences of substance use and withdrawal: Pt reports, sweating, anxiety, and a history of DT's, withdrawal symptoms from alcohol.  Dimension 2:  Biomedical Conditions and Complications:   Dimension 2:  Description of patient's biomedical conditions and  complications: Pt reports he is sweating and has anxiety.  Dimension 3:  Emotional, Behavioral, or Cognitive Conditions and Complications:  Dimension 3:  Description of emotional, behavioral, or cognitive conditions and complications: Pt has a previous diagnose of Major Depressive Disorder and Alcohol use Disorder, severe, dependence.  Dimension 4:  Readiness to Change:  Dimension 4:  Description of Readiness to Change criteria: Pt reports, he wants help and is unable to contract for safety.  Dimension 5:  Relapse, Continued use, or Continued Problem Potential:  Dimension 5:  Relapse, continued use, or continued problem potential critiera description: Pt reports wanting help with alcohol.  Dimension 6:  Recovery/Living Environment:  Dimension 6:  Recovery/Iiving environment criteria description: Patient is homeless and has minimal support  ASAM Severity Score: ASAM's Severity Rating Score: 11  ASAM Recommended Level of Treatment: ASAM Recommended Level of Treatment: Level III Residential Treatment   Substance use Disorder (SUD) Substance Use  Disorder (SUD)  Checklist Symptoms of Substance Use: Continued use despite having a persistent/recurrent physical/psychological problem caused/exacerbated by use, Continued use despite persistent or recurrent social, interpersonal problems, caused or exacerbated by use, Evidence of tolerance, Evidence of withdrawal (Comment), Persistent desire or unsuccessful efforts to cut down or control use, Presence of craving or strong urge to use, Recurrent use that results in a failure to fulfill major role obligations (work, school, home), Social, occupational, recreational activities given up or reduced due to use, Substance(s) often taken in larger amounts or over longer times than was intended  Recommendations for Services/Supports/Treatments: Recommendations for Services/Supports/Treatments Recommendations For Services/Supports/Treatments: Other (Comment) ([facility].)  Discharge Disposition:    DSM5 Diagnoses: Patient Active Problem List   Diagnosis Date Noted   MDD (major depressive disorder), recurrent severe, without psychosis (HCC) 03/22/2021   Suicidal ideation 03/21/2021   Alcohol-induced mood disorder (HCC)      Referrals to Alternative Service(s): Referred to Alternative Service(s):   Place:   Date:   Time:    Referred to Alternative Service(s):   Place:   Date:   Time:    Referred to Alternative Service(s):   Place:   Date:   Time:    Referred to Alternative Service(s):   Place:   Date:   Time:     Alfredia FergusonDavid L Sabel Hornbeck, LCAS

## 2021-07-15 ENCOUNTER — Encounter (HOSPITAL_COMMUNITY): Payer: Self-pay | Admitting: Adult Health

## 2021-07-15 ENCOUNTER — Inpatient Hospital Stay (HOSPITAL_COMMUNITY)
Admission: AD | Admit: 2021-07-15 | Discharge: 2021-07-26 | DRG: 885 | Disposition: A | Discharge status: Home / Self Care | Payer: Federal, State, Local not specified - Other | Source: Intra-hospital | Attending: Psychiatry | Admitting: Psychiatry

## 2021-07-15 ENCOUNTER — Other Ambulatory Visit: Payer: Self-pay

## 2021-07-15 DIAGNOSIS — F332 Major depressive disorder, recurrent severe without psychotic features: Secondary | ICD-10-CM | POA: Diagnosis present

## 2021-07-15 DIAGNOSIS — F1021 Alcohol dependence, in remission: Secondary | ICD-10-CM | POA: Diagnosis not present

## 2021-07-15 DIAGNOSIS — F331 Major depressive disorder, recurrent, moderate: Secondary | ICD-10-CM | POA: Diagnosis not present

## 2021-07-15 DIAGNOSIS — E519 Thiamine deficiency, unspecified: Secondary | ICD-10-CM | POA: Diagnosis present

## 2021-07-15 DIAGNOSIS — F10239 Alcohol dependence with withdrawal, unspecified: Secondary | ICD-10-CM | POA: Diagnosis present

## 2021-07-15 DIAGNOSIS — Z5901 Sheltered homelessness: Secondary | ICD-10-CM

## 2021-07-15 DIAGNOSIS — Z8249 Family history of ischemic heart disease and other diseases of the circulatory system: Secondary | ICD-10-CM

## 2021-07-15 DIAGNOSIS — F22 Delusional disorders: Secondary | ICD-10-CM | POA: Diagnosis present

## 2021-07-15 DIAGNOSIS — F17210 Nicotine dependence, cigarettes, uncomplicated: Secondary | ICD-10-CM | POA: Diagnosis present

## 2021-07-15 DIAGNOSIS — F1510 Other stimulant abuse, uncomplicated: Secondary | ICD-10-CM | POA: Diagnosis present

## 2021-07-15 DIAGNOSIS — F410 Panic disorder [episodic paroxysmal anxiety] without agoraphobia: Secondary | ICD-10-CM | POA: Diagnosis present

## 2021-07-15 DIAGNOSIS — Z818 Family history of other mental and behavioral disorders: Secondary | ICD-10-CM | POA: Diagnosis not present

## 2021-07-15 DIAGNOSIS — G479 Sleep disorder, unspecified: Secondary | ICD-10-CM | POA: Diagnosis present

## 2021-07-15 DIAGNOSIS — F1010 Alcohol abuse, uncomplicated: Secondary | ICD-10-CM | POA: Diagnosis present

## 2021-07-15 DIAGNOSIS — F1024 Alcohol dependence with alcohol-induced mood disorder: Secondary | ICD-10-CM | POA: Diagnosis present

## 2021-07-15 DIAGNOSIS — R45851 Suicidal ideations: Secondary | ICD-10-CM | POA: Diagnosis present

## 2021-07-15 DIAGNOSIS — F1094 Alcohol use, unspecified with alcohol-induced mood disorder: Secondary | ICD-10-CM | POA: Diagnosis not present

## 2021-07-15 DIAGNOSIS — Z20822 Contact with and (suspected) exposure to covid-19: Secondary | ICD-10-CM | POA: Diagnosis not present

## 2021-07-15 MED ORDER — ACETAMINOPHEN 325 MG PO TABS: 650.0000 mg | ORAL_TABLET | Freq: Four times a day (QID) | ORAL | Status: DC | PRN

## 2021-07-15 MED ORDER — LOPERAMIDE HCL 2 MG PO CAPS: 4.0000 mg | ORAL_CAPSULE | Freq: Four times a day (QID) | ORAL | Status: DC | PRN

## 2021-07-15 MED ORDER — CARBAMAZEPINE 200 MG PO TABS
200.0000 mg | ORAL_TABLET | Freq: Two times a day (BID) | ORAL | Status: DC
  Administered 2021-07-15 – 2021-07-19 (×8): 200 mg via ORAL
  Filled 2021-07-15 (×13): qty 1

## 2021-07-15 MED ORDER — PAROXETINE HCL 20 MG PO TABS
20.0000 mg | ORAL_TABLET | Freq: Every morning | ORAL | Status: DC
  Administered 2021-07-15 – 2021-07-22 (×8): 20 mg via ORAL
  Filled 2021-07-15: qty 1
  Filled 2021-07-15: qty 14
  Filled 2021-07-15 (×6): qty 1
  Filled 2021-07-15: qty 14
  Filled 2021-07-15 (×5): qty 1

## 2021-07-15 MED ORDER — TRAZODONE HCL 50 MG PO TABS
50.0000 mg | ORAL_TABLET | Freq: Every evening | ORAL | Status: DC | PRN
  Administered 2021-07-16 – 2021-07-24 (×4): 50 mg via ORAL
  Filled 2021-07-15 (×2): qty 1
  Filled 2021-07-15: qty 14
  Filled 2021-07-15 (×2): qty 1

## 2021-07-15 MED ORDER — ADULT MULTIVITAMIN W/MINERALS CH
1.0000 | ORAL_TABLET | Freq: Every morning | ORAL | Status: DC
  Administered 2021-07-15 – 2021-07-24 (×10): 1 via ORAL
  Filled 2021-07-15 (×14): qty 1

## 2021-07-15 MED ORDER — HYDROXYZINE HCL 50 MG PO TABS
50.0000 mg | ORAL_TABLET | Freq: Four times a day (QID) | ORAL | Status: DC | PRN
  Administered 2021-07-15 – 2021-07-25 (×19): 50 mg via ORAL
  Filled 2021-07-15 (×15): qty 1
  Filled 2021-07-15: qty 20
  Filled 2021-07-15 (×4): qty 1

## 2021-07-15 MED ORDER — NICOTINE POLACRILEX 2 MG MT GUM
2.0000 mg | CHEWING_GUM | OROMUCOSAL | Status: DC | PRN
  Administered 2021-07-15 – 2021-07-26 (×49): 2 mg via ORAL
  Filled 2021-07-15 (×28): qty 1

## 2021-07-15 MED ORDER — BUSPIRONE HCL 10 MG PO TABS
10.0000 mg | ORAL_TABLET | Freq: Two times a day (BID) | ORAL | Status: DC
Start: 2021-07-15 — End: 2021-07-16
  Administered 2021-07-15 – 2021-07-16 (×2): 10 mg via ORAL
  Filled 2021-07-15: qty 2
  Filled 2021-07-15 (×4): qty 1

## 2021-07-15 MED ORDER — GABAPENTIN 100 MG PO CAPS: 100.0000 mg | ORAL_CAPSULE | Freq: Two times a day (BID) | ORAL | Status: DC

## 2021-07-15 MED ORDER — LORAZEPAM 1 MG PO TABS: 1.0000 mg | ORAL_TABLET | Freq: Four times a day (QID) | ORAL | Status: DC | PRN

## 2021-07-15 MED ORDER — NICOTINE 21 MG/24HR TD PT24
21.0000 mg | MEDICATED_PATCH | Freq: Every day | TRANSDERMAL | Status: DC
  Filled 2021-07-15: qty 1

## 2021-07-15 MED ORDER — GABAPENTIN 300 MG PO CAPS
300.0000 mg | ORAL_CAPSULE | Freq: Two times a day (BID) | ORAL | Status: DC
  Administered 2021-07-15 – 2021-07-16 (×2): 300 mg via ORAL
  Filled 2021-07-15 (×5): qty 1

## 2021-07-15 NOTE — ED Notes (Addendum)
Pt cellphone has been locked up with security. Key and yellow slip has been placed in the bag with the charger which is in the pt belonging bag that is grey on the floor behind charge nurse station.

## 2021-07-15 NOTE — ED Notes (Signed)
Patient given meal tray.

## 2021-07-15 NOTE — ED Notes (Signed)
Patient transported to [facility] by [employer].  IVC paperwork sent with patient.

## 2021-07-15 NOTE — Progress Notes (Signed)
Per Joslyn Devon, [facility], pt has been accepted to [facility] bed 407-1. Accepting provider is Vivien Presto, Attending provider is Dr. Jola Babinski. Patient can arrive anytime. Number for report is [phone]   Crissie Reese, MSW, LCSW-A Phone: [phone] Disposition/TOC

## 2021-07-15 NOTE — ED Provider Notes (Signed)
Emergency Medicine Observation Re-evaluation Note  Carlos Flowers is a 40 y.o. male, seen on rounds today.  Pt initially presented to the ED for complaints of Suicidal Currently, the patient is resting comfortably.  Physical Exam  BP (!) 148/89   Pulse 90   Temp 98.7 F (37.1 C) (Oral)   Resp 18   Ht 6' (1.829 m)   Wt 43.5 kg   SpO2 95%   BMI 13.02 kg/m  Physical Exam General: Nontoxic appearing Cardiac: Normal heart rate Lungs: Normal respiratory rate Psych: No internal responsiveness  ED Course / MDM  EKG:   I have reviewed the labs performed to date as well as medications administered while in observation.  Recent changes in the last 24 hours include has remained cooperative.  [facility].  Plan  Current plan is for psychiatric admission.  Carlos Flowers is under involuntary commitment.     Mancel Bale, MD 07/15/21 1139
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
## 2021-07-15 NOTE — ED Notes (Signed)
Pts phone and charger secured in patients belongings. Pt wanded by security at this time to ensure no further contraband or hazards.

## 2021-07-15 NOTE — Tx Team (Signed)
Initial Treatment Plan 07/15/2021 6:06 PM Kathleen Lime Quickel [id number]    PATIENT STRESSORS: Educational concerns Financial difficulties Medication change or noncompliance Occupational concerns Substance abuse   PATIENT STRENGTHS: Ability for insight Capable of independent living Communication skills Physical Health Religious Affiliation Supportive family/friends   PATIENT IDENTIFIED PROBLEMS: Alterations in mood (anxiety and depression ) "I was having bad panic attacks at [facility], I was suicidal too so they sent me to [facility]".     Medication noncompliance "I have been off my medications for months now since I left here because I could not afford it".    Substance Abuse "I drink 15 bottles of beers / day. I have been drinking hard like that for 5 years now".    Homeless "I have been staying with friends here and there".         DISCHARGE CRITERIA:  Improved stabilization in mood, thinking, and/or behavior Verbal commitment to aftercare and medication compliance  PRELIMINARY DISCHARGE PLAN: Outpatient therapy Placement in alternative living arrangements  PATIENT/FAMILY INVOLVEMENT: This treatment plan has been presented to and reviewed with the patient, Octavia Mottola.  The patient have been given the opportunity to ask questions and make suggestions.  Sherryl Manges, RN 07/15/2021, 6:06 PM

## 2021-07-15 NOTE — Progress Notes (Signed)
Patient ID: Carlos Flowers, male   DOB: 1981-01-18, 40 y.o.   MRN: [id number] Patient has been accepted to [facility] 407-1. Please call nurse-to-nurse report to [phone]. Patient may be transported after report. Rayfield Citizen, RN/AC

## 2021-07-15 NOTE — ED Notes (Signed)
Report called to [facility], RN at [facility] and [facility] contacted for transport since patient is here under IVC.

## 2021-07-15 NOTE — ED Notes (Signed)
Noted by this RN that pt still has his cell phone. Security to bedside to secure phone from patient.

## 2021-07-15 NOTE — ED Notes (Signed)
Pt sleeping comfortably at this time. Respirations even and unlabored, NADN. VS deferred at this time in order to allow for adequate rest and recovery.

## 2021-07-15 NOTE — [facility] Group Notes (Signed)
Pt did not attend group. 

## 2021-07-16 ENCOUNTER — Encounter (HOSPITAL_COMMUNITY): Payer: Self-pay | Admitting: Adult Health

## 2021-07-16 MED ORDER — LORAZEPAM 1 MG PO TABS: 1.0000 mg | ORAL_TABLET | Freq: Two times a day (BID) | ORAL | Status: DC | PRN

## 2021-07-16 MED ORDER — BUSPIRONE HCL 10 MG PO TABS
10.0000 mg | ORAL_TABLET | Freq: Three times a day (TID) | ORAL | Status: DC
  Administered 2021-07-16 – 2021-07-26 (×30): 10 mg via ORAL
  Filled 2021-07-16: qty 42
  Filled 2021-07-16: qty 1
  Filled 2021-07-16 (×2): qty 42
  Filled 2021-07-16 (×5): qty 1
  Filled 2021-07-16: qty 42
  Filled 2021-07-16: qty 1
  Filled 2021-07-16 (×2): qty 42
  Filled 2021-07-16 (×6): qty 1
  Filled 2021-07-16: qty 42
  Filled 2021-07-16 (×2): qty 1
  Filled 2021-07-16 (×2): qty 42
  Filled 2021-07-16: qty 1
  Filled 2021-07-16: qty 42
  Filled 2021-07-16: qty 2
  Filled 2021-07-16: qty 1
  Filled 2021-07-16: qty 42
  Filled 2021-07-16: qty 1
  Filled 2021-07-16 (×2): qty 42
  Filled 2021-07-16 (×2): qty 1
  Filled 2021-07-16: qty 42
  Filled 2021-07-16: qty 1
  Filled 2021-07-16: qty 42

## 2021-07-16 MED ORDER — GABAPENTIN 300 MG PO CAPS
300.0000 mg | ORAL_CAPSULE | Freq: Three times a day (TID) | ORAL | Status: DC
  Administered 2021-07-16 – 2021-07-26 (×30): 300 mg via ORAL
  Filled 2021-07-16 (×2): qty 1
  Filled 2021-07-16 (×2): qty 42
  Filled 2021-07-16: qty 1
  Filled 2021-07-16: qty 42
  Filled 2021-07-16: qty 1
  Filled 2021-07-16: qty 42
  Filled 2021-07-16: qty 1
  Filled 2021-07-16 (×2): qty 42
  Filled 2021-07-16 (×5): qty 1
  Filled 2021-07-16: qty 42
  Filled 2021-07-16 (×2): qty 1
  Filled 2021-07-16 (×2): qty 42
  Filled 2021-07-16: qty 1
  Filled 2021-07-16 (×2): qty 42
  Filled 2021-07-16 (×4): qty 1
  Filled 2021-07-16: qty 42
  Filled 2021-07-16 (×2): qty 1
  Filled 2021-07-16 (×3): qty 42
  Filled 2021-07-16 (×4): qty 1

## 2021-07-16 NOTE — [facility] Group Notes (Signed)
Did Not Attend  Juanpablo Ciresi, LCSWA Clinicial Social Worker  Health  
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
## 2021-07-16 NOTE — Progress Notes (Signed)
DAR NOTE: Patient presents with anxious affect and depressed mood.  Denies pain, auditory and visual hallucinations. Maintained on routine safety checks.  Medications given as prescribed.  Support and encouragement offered as needed. Will continue to monitor. 

## 2021-07-16 NOTE — BHH Counselor (Signed)
CSW spoke to [place] at [facility] who stated that their facility sent him to [facility] because they felt he needed a higher level of care. Huntley Dec states that she spoke with [facility] who stated pt needs an inpatient hospitalization. CSW explained that pt is currently at an inpatient hospital and would be here apx 3-5 days. Huntley Dec stated she would talk with her clinical supervisor and contact CSW Lawanna Kobus with a decision if pt can return to [facility].   Fredirick Lathe, LCSWA Clinicial Social Worker [employer]
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
## 2021-07-16 NOTE — BHH Group Notes (Signed)
Occupational Therapy Group Note Date: 07/16/2021 Group Topic/Focus: Sleep Hygiene  Group Description: Group encouraged increased engagement and participation through group discussion focused on Sleep Hygiene. Patients were encouraged to share their own concerns and difficulties with getting a healthy night's rest. Patients were then provided education and resources on habits/routines to maintain healthy sleep hygiene.  Therapeutic Goal(s): Identify and share any current issues with sleep routine and overall sleep hygiene Identify strategies, both environmental and lifestyle, that could help to improve overall sleep quality Participation Level: Patient did not attend OT group session despite personal invitation.   Plan: Continue to engage patient in OT groups 2 - 3x/week.  07/16/2021  Deltha Bernales, MOT, OTR/L  

## 2021-07-16 NOTE — Progress Notes (Signed)
Spiritual care group on grief and loss facilitated by chaplain Delphina Schum MDiv, [facility]  Group Goal:  Support / Education around grief and loss Members engage in facilitated group support and psycho-social education.  Group Description:  Following introductions and group rules, group members engaged in facilitated group dialog and support around topic of loss, with particular support around experiences of loss in their lives. Group Identified types of loss (relationships / self / things) and identified patterns, circumstances, and changes that precipitate losses. Reflected on thoughts / feelings around loss, normalized grief responses, and recognized variety in grief experience.   Group noted Worden's four tasks of grief in discussion.  Group drew on Adlerian / Rogerian, narrative, MI, Patient Progress: Invited to group.  Did not attend.   

## 2021-07-16 NOTE — Progress Notes (Addendum)
   07/16/21 2000  Psych Admission Type (Psych Patients Only)  Admission Status Involuntary  Psychosocial Assessment  Patient Complaints Anxiety;Depression;Irritability  Eye Contact Brief  Facial Expression Flat  Affect Appropriate to circumstance  Speech Logical/coherent;Soft  Interaction Isolative  Motor Activity Other (Comment) (wnl)  Appearance/Hygiene Improved  Behavior Characteristics Irritable  Mood Depressed;Anxious  Thought Process  Coherency Circumstantial  Content WDL  Delusions None reported or observed  Perception WDL  Hallucination None reported or observed  Judgment Poor  Confusion None  Danger to Self  Current suicidal ideation? Denies  Danger to Others  Danger to Others None reported or observed   Pt seen in room. Irritable. Denies SI, HI, AVH and pain. Rates anxiety and depression both 10/10. Refused to talk about why they are so high today.

## 2021-07-16 NOTE — H&P (Signed)
Psychiatric Admission Assessment Adult  Patient Identification: Carlos Flowers MRN:  [id number] Date of Evaluation:  07/16/2021 Chief Complaint:  MDD (major depressive disorder), recurrent episode, severe (HCC) [F33.2] Principal Diagnosis: MDD (major depressive disorder), recurrent episode, severe (HCC) Diagnosis:  Principal Problem:   MDD (major depressive disorder), recurrent episode, severe (HCC) Active Problems:   Suicidal ideation  History of Present Illness:   Carlos Flowers is a 40 y.o. male with past psychiatric history of alcohol use disorder x5y and past psychiatric hospitalization for suicidal ideation and alcohol use disorder who presented via [facility] with the [facility] as an involuntary admission with worsening SI and complaints of panic attacks.   Per chart review, he has been staying at [facility]. He states that he has had "worsening panic attacks, anxiety, and withdrawing from alcohol" at [facility], and wanted to seek treatment with Ativan, which was why he presented to the ED. Per interview, he states that he has lost interest in sports, which he has enjoyed in the past, for "years". He states that for the past several months, he has felt sad, guilty, and "more down than before". He states that he has slept more than typical for him, and had decreased appetite for the past several months as well. He states that he has had periods of depression while not using alcohol as well, however that his depression is worse when he is using alcohol. He endorses feelings of worthlessness, stating that he "does not have motivation to be alive". He denies any prior suicide attempt or plan to complete suicide. He denies family history of bipolar disorder, schizophrenia, or suicide completion, and endorses maternal family history of depression and anxiety. He does not currently report HI or AVH.   Pertaining to his alcohol use disorder, he states that he has drank "a case of beer  a day" for the past four years. He has experienced withdrawal from alcohol in the past, and states that his symptoms this time around are worse than prior withdrawals. He states that he "just needs some Ativan". He endorses a remote history of Meth use, but states it has been several months since last use. He endorses symptoms of anxiety, tremulousness, night sweats, nausea, headache, and dizziness. He denies vomiting, diarrhea, and constipation. He denies prior seizure.  He had one prior hospitalization at [facility] in April of 2022 for alcohol abuse and SI, from which he was discharged on gabapentin 100mg  BID, hydroxyzine 25mg  TID, paroxetine 20mg  qd, and trazodone 50mg  prn. He reports that these medications helped "to an extent", though he stopped taking them shortly after hospitalization, and self-medicated with alcohol. Additionally, he presented to [facility] in May of 2022 for SI and alcohol detox. He presented to 12 different emergency departments from 03/22/21 until 04/12/21.    Associated Signs/Symptoms: Depression Symptoms:  depressed mood, anhedonia, feelings of worthlessness/guilt, difficulty concentrating, hopelessness, suicidal thoughts without plan, anxiety, loss of energy/fatigue, disturbed sleep, decreased appetite, Duration of Depression Symptoms: No data recorded (Hypo) Manic Symptoms:   Reports None Anxiety Symptoms:  Excessive Worry, Panic Symptoms, Psychotic Symptoms:   Reports None PTSD Symptoms: NA Total Time spent with patient: 1 hour  Past Psychiatric History: MDD, Anxiety, EtOH abuse, last hospitalization [facility] 03/2021.  Is the patient at risk to self? No.  Has the patient been a risk to self in the past 6 months? No.  Has the patient been a risk to self within the distant past? No.  Is the patient a risk to  others? No.  Has the patient been a risk to others in the past 6 months? No.  Has the patient been a risk to others within the distant past? No.   Prior Inpatient  Therapy:  [facility] 03/2021 Prior Outpatient Therapy:    Alcohol Screening: Patient refused Alcohol Screening Tool: Yes 1. How often do you have a drink containing alcohol?: 4 or more times a week 2. How many drinks containing alcohol do you have on a typical day when you are drinking?: 10 or more (15 beers / day) 3. How often do you have six or more drinks on one occasion?: Daily or almost daily AUDIT-C Score: 12 4. How often during the last year have you found that you were not able to stop drinking once you had started?: Daily or almost daily 5. How often during the last year have you failed to do what was normally expected from you because of drinking?: Daily or almost daily 6. How often during the last year have you needed a first drink in the morning to get yourself going after a heavy drinking session?: Daily or almost daily 7. How often during the last year have you had a feeling of guilt of remorse after drinking?: Daily or almost daily 8. How often during the last year have you been unable to remember what happened the night before because you had been drinking?: Weekly 9. Have you or someone else been injured as a result of your drinking?: No 10. Has a relative or friend or a doctor or another health worker been concerned about your drinking or suggested you cut down?: Yes, during the last year Alcohol Use Disorder Identification Test Final Score (AUDIT): 35 Alcohol Brief Interventions/Follow-up: Alcohol education/Brief advice (Pt's etoh level is less than 10. Was at [facility] for detox for approximately 1 week prior to transfer to [facility]. Pt does not present to be active withdrawal at this time.) Substance Abuse History in the last 12 months:  Yes.   Consequences of Substance Abuse: Medical Consequences:  Frequent ER visits and hospitalizations Previous Psychotropic Medications: Yes Buspar, Tegretol, Gabapentin, Paxil Psychological Evaluations: No  Past Medical History:  Past Medical  History:  Diagnosis Date   Alcoholism (HCC)    Anxiety    Depression    Substance abuse (HCC)    History reviewed. No pertinent surgical history. Family History:  Family History  Problem Relation Age of Onset   Cancer Maternal Grandmother    Heart disease Paternal Grandmother    Heart disease Paternal Grandfather    Family Psychiatric  History: Mother: Depression, Anxiety Maternal side- multiple depression Tobacco Screening:   Social History:  Social History   Substance and Sexual Activity  Alcohol Use Yes   Alcohol/week: 10.0 - 12.0 standard drinks   Types: 10 - 12 Cans of beer per week     Social History   Substance and Sexual Activity  Drug Use Not Currently   Types: Cocaine, Heroin   Comment: last use 12/17/2018    Additional Social History:                           Allergies:   Allergies  Allergen Reactions   Peanut-Containing Drug Products Anaphylaxis   Lab Results: No results found for this or any previous visit (from the past 48 hour(s)).  Blood Alcohol level:  Lab Results  Component Value Date   ETH <10 07/13/2021   ETH <10 04/13/2021  Metabolic Disorder Labs:  Lab Results  Component Value Date   HGBA1C 5.7 (H) 03/24/2021   MPG 116.89 03/24/2021   MPG 111.15 08/08/2020   No results found for: PROLACTIN Lab Results  Component Value Date   CHOL 142 03/24/2021   TRIG 58 03/24/2021   HDL 39 (L) 03/24/2021   CHOLHDL 3.6 03/24/2021   VLDL 12 03/24/2021   LDLCALC 91 03/24/2021   LDLCALC 62 08/08/2020    Current Medications: Current Facility-Administered Medications  Medication Dose Route Frequency Provider Last Rate Last Admin   acetaminophen (TYLENOL) tablet 650 mg  650 mg Oral Q6H PRN Antonieta Pertlary, Greg Lawson, MD       busPIRone (BUSPAR) tablet 10 mg  10 mg Oral TID Antonieta Pertlary, Greg Lawson, MD       carbamazepine (TEGRETOL) tablet 200 mg  200 mg Oral BID Antonieta Pertlary, Greg Lawson, MD   200 mg at 07/16/21 [id number]   gabapentin (NEURONTIN) capsule  300 mg  300 mg Oral TID Antonieta Pertlary, Greg Lawson, MD       hydrOXYzine (ATARAX/VISTARIL) tablet 50 mg  50 mg Oral Q6H PRN Antonieta Pertlary, Greg Lawson, MD   50 mg at 07/16/21 0003   loperamide (IMODIUM) capsule 4 mg  4 mg Oral Q6H PRN Antonieta Pertlary, Greg Lawson, MD       LORazepam (ATIVAN) tablet 1 mg  1 mg Oral Q12H PRN Antonieta Pertlary, Greg Lawson, MD       multivitamin with minerals tablet 1 tablet  1 tablet Oral q morning Antonieta Pertlary, Greg Lawson, MD   1 tablet at 07/16/21 [id number]   nicotine polacrilex (NICORETTE) gum 2 mg  2 mg Oral PRN Antonieta Pertlary, Greg Lawson, MD   2 mg at 07/16/21 [id number]   PARoxetine (PAXIL) tablet 20 mg  20 mg Oral q morning Antonieta Pertlary, Greg Lawson, MD   20 mg at 07/15/21 1658   traZODone (DESYREL) tablet 50 mg  50 mg Oral QHS PRN Antonieta Pertlary, Greg Lawson, MD   50 mg at 07/16/21 0002   PTA Medications: Medications Prior to Admission  Medication Sig Dispense Refill Last Dose   busPIRone (BUSPAR) 10 MG tablet Take 10 mg by mouth 3 (three) times daily. 8am, 12pm, 9pm      carbamazepine (TEGRETOL) 200 MG tablet Take 100-200 mg by mouth See admin instructions. Start date 07/05/2021: take 1 tablet (200 mg) by mouth twice daily for four days, then take 1/2 tablet (100 mg) twice daily for 3 days, then take 1/2 tablet (100 mg) daily for 3 days      hydrOXYzine (ATARAX/VISTARIL) 25 MG tablet Take 1 tablet (25 mg total) by mouth 3 (three) times daily as needed for anxiety. (Patient not taking: No sig reported) 30 tablet 0    Multiple Vitamin (MULTIVITAMIN WITH MINERALS) TABS tablet Take 1 tablet by mouth every morning.      Omega-3 Fatty Acids (FISH OIL) 1000 MG CAPS Take 3,000 mg by mouth every morning.      PARoxetine (PAXIL) 20 MG tablet Take 20 mg by mouth every morning.      thiamine 100 MG tablet Take 100 mg by mouth every morning.      traZODone (DESYREL) 50 MG tablet Take 50 mg by mouth at bedtime as needed for sleep.       Musculoskeletal: Strength & Muscle Tone: within normal limits Gait & Station: normal Patient leans:  N/A            Psychiatric Specialty Exam:  Presentation  General Appearance: Fairly Groomed; Appropriate for  Environment; Casual  Eye Contact:Good  Speech:Clear and Coherent; Normal Rate  Speech Volume:Normal  Handedness:Right   Mood and Affect  Mood:Anxious; Irritable; Depressed  Affect:Appropriate   Thought Process  Thought Processes:Coherent; Goal Directed; Linear  Duration of Psychotic Symptoms: No data recorded Past Diagnosis of Schizophrenia or Psychoactive disorder: No  Descriptions of Associations:Circumstantial  Orientation:Full (Time, Place and Person)  Thought Content:Logical; Perseveration  Hallucinations:Hallucinations: None  Ideas of Reference:None  Suicidal Thoughts:Suicidal Thoughts: Yes, Passive SI Passive Intent and/or Plan: Without Intent; Without Plan  Homicidal Thoughts:Homicidal Thoughts: No   Sensorium  Memory:Immediate Fair; Recent Fair  Judgment:Impaired  Insight:Lacking   Executive Functions  Concentration:Fair  Attention Span:Fair  Recall:Fair  Fund of Knowledge:Fair  Language:Good   Psychomotor Activity  Psychomotor Activity:Psychomotor Activity: Normal (mild hand tremor bilaterally)   Assets  Assets:Housing; Resilience; Desire for Improvement   Sleep  Sleep:Sleep: Good Number of Hours of Sleep: 6    Physical Exam: Physical Exam Vitals and nursing note reviewed.  Constitutional:      General: He is not in acute distress.    Appearance: Normal appearance. He is normal weight. He is not ill-appearing or toxic-appearing.  HENT:     Head: Normocephalic and atraumatic.  Pulmonary:     Effort: Pulmonary effort is normal.  Musculoskeletal:        General: Normal range of motion.  Neurological:     General: No focal deficit present.     Mental Status: He is alert.   Review of Systems  Eyes:  Positive for blurred vision.  Respiratory:  Positive for shortness of breath. Negative for cough.    Cardiovascular:  Positive for chest pain (mild).  Gastrointestinal:  Positive for nausea. Negative for abdominal pain, constipation, diarrhea and vomiting.  Neurological:  Positive for dizziness and headaches. Negative for weakness.  Psychiatric/Behavioral:  Positive for depression and suicidal ideas. Negative for hallucinations. The patient is nervous/anxious.   Blood pressure 107/81, pulse (!) 109, temperature (!) 97.5 F (36.4 C), temperature source Oral, resp. rate 18, height 6' (1.829 m), weight 43.5 kg, SpO2 95 %. Body mass index is 13.02 kg/m.  Treatment Plan Summary: Daily contact with patient to assess and evaluate symptoms and progress in treatment   MDD, Recurrent Severe  Anxiety: -Continue Paxil 20 mg daily -Continue Buspar 10 mg TID -Continue Tegretol 200 mg BID   EtOH: -CIWA -Continue Ativan 1 mg q12 PRN  CIWA>10 -Continue 300 mg TID -Continue Imodium 4 mg q6 PRN   Nicotine Dependence: -Continue Nicotine gum 2 mg PRN   -Continue Multivitamin daily -Continue PRN's: Tylenol, Maalox, Atarax, Milk of Magnesia, Trazodone   Observation Level/Precautions:  15 minute checks  Laboratory:  CMP- WNL  CBC- WBC: 10.8 (high)  UDS- Benzo positive Ethanol-Neg    Lipid Panel/A1c ordered  Psychotherapy:    Medications:  Buspar, Gabapentin, Tegretol, Paxil  Consultations:    Discharge Concerns:    Estimated LOS: 3-5 days  Other:     Physician Treatment Plan for Primary Diagnosis: MDD (major depressive disorder), recurrent episode, severe (HCC) Long Term Goal(s): Improvement in symptoms so as ready for discharge  Short Term Goals: Ability to identify changes in lifestyle to reduce recurrence of condition will improve, Ability to disclose and discuss suicidal ideas, Ability to demonstrate self-control will improve, Ability to identify and develop effective coping behaviors will improve, Compliance with prescribed medications will improve, and Ability to identify triggers  associated with substance abuse/mental health issues will improve  Physician Treatment Plan for Secondary  Diagnosis: Principal Problem:   MDD (major depressive disorder), recurrent episode, severe (HCC) Active Problems:   Suicidal ideation  Long Term Goal(s): Improvement in symptoms so as ready for discharge  Short Term Goals: Ability to identify changes in lifestyle to reduce recurrence of condition will improve, Ability to verbalize feelings will improve, Ability to disclose and discuss suicidal ideas, Ability to demonstrate self-control will improve, Ability to identify and develop effective coping behaviors will improve, Compliance with prescribed medications will improve, and Ability to identify triggers associated with substance abuse/mental health issues will improve  I certify that inpatient services furnished can reasonably be expected to improve the patient's condition.    Lauro Franklin, MD 8/15/202211:02 AM

## 2021-07-16 NOTE — Tx Team (Signed)
Interdisciplinary Treatment and Diagnostic Plan Update  07/16/2021 Time of Session: 9:40am Carlos Flowers MRN: [id number]  Principal Diagnosis: MDD (major depressive disorder), recurrent episode, severe ([place])  Secondary Diagnoses: Principal Problem:   MDD (major depressive disorder), recurrent episode, severe ([place]) Active Problems:   Suicidal ideation   Current Medications:  Current Facility-Administered Medications  Medication Dose Route Frequency Provider Last Rate Last Admin   acetaminophen (TYLENOL) tablet 650 mg  650 mg Oral Q6H PRN Sharma Covert, MD       busPIRone (BUSPAR) tablet 10 mg  10 mg Oral TID Sharma Covert, MD       carbamazepine (TEGRETOL) tablet 200 mg  200 mg Oral BID Sharma Covert, MD   200 mg at 07/16/21 3762   gabapentin (NEURONTIN) capsule 300 mg  300 mg Oral TID Sharma Covert, MD       hydrOXYzine (ATARAX/VISTARIL) tablet 50 mg  50 mg Oral Q6H PRN Sharma Covert, MD   50 mg at 07/16/21 0003   loperamide (IMODIUM) capsule 4 mg  4 mg Oral Q6H PRN Sharma Covert, MD       LORazepam (ATIVAN) tablet 1 mg  1 mg Oral Q12H PRN Sharma Covert, MD       multivitamin with minerals tablet 1 tablet  1 tablet Oral q morning Sharma Covert, MD   1 tablet at 07/16/21 8315   nicotine polacrilex (NICORETTE) gum 2 mg  2 mg Oral PRN Sharma Covert, MD   2 mg at 07/16/21 1761   PARoxetine (PAXIL) tablet 20 mg  20 mg Oral q morning Sharma Covert, MD   20 mg at 07/15/21 1658   traZODone (DESYREL) tablet 50 mg  50 mg Oral QHS PRN Sharma Covert, MD   50 mg at 07/16/21 0002   PTA Medications: Medications Prior to Admission  Medication Sig Dispense Refill Last Dose   busPIRone (BUSPAR) 10 MG tablet Take 10 mg by mouth 3 (three) times daily. 8am, 12pm, 9pm      carbamazepine (TEGRETOL) 200 MG tablet Take 100-200 mg by mouth See admin instructions. Start date 07/05/2021: take 1 tablet (200 mg) by mouth twice daily for four days, then  take 1/2 tablet (100 mg) twice daily for 3 days, then take 1/2 tablet (100 mg) daily for 3 days      hydrOXYzine (ATARAX/VISTARIL) 25 MG tablet Take 1 tablet (25 mg total) by mouth 3 (three) times daily as needed for anxiety. (Patient not taking: No sig reported) 30 tablet 0    Multiple Vitamin (MULTIVITAMIN WITH MINERALS) TABS tablet Take 1 tablet by mouth every morning.      Omega-3 Fatty Acids (FISH OIL) 1000 MG CAPS Take 3,000 mg by mouth every morning.      PARoxetine (PAXIL) 20 MG tablet Take 20 mg by mouth every morning.      thiamine 100 MG tablet Take 100 mg by mouth every morning.      traZODone (DESYREL) 50 MG tablet Take 50 mg by mouth at bedtime as needed for sleep.       Patient Stressors: Network engineer difficulties Medication change or noncompliance Occupational concerns Substance abuse  Patient Strengths: Ability for insight Capable of independent living Communication skills Physical Health Religious Affiliation Supportive family/friends  Treatment Modalities: Medication Management, Group therapy, Case management,  1 to 1 session with clinician, Psychoeducation, Recreational therapy.   Physician Treatment Plan for Primary Diagnosis: MDD (major depressive disorder), recurrent episode,  severe ([place]) Long Term Goal(s): Improvement in symptoms so as ready for discharge   Short Term Goals: Ability to identify changes in lifestyle to reduce recurrence of condition will improve Ability to verbalize feelings will improve Ability to disclose and discuss suicidal ideas Ability to demonstrate self-control will improve Ability to identify and develop effective coping behaviors will improve Compliance with prescribed medications will improve Ability to identify triggers associated with substance abuse/mental health issues will improve  Medication Management: Evaluate patient's response, side effects, and tolerance of medication regimen.  Therapeutic  Interventions: 1 to 1 sessions, Unit Group sessions and Medication administration.  Evaluation of Outcomes: Not Met  Physician Treatment Plan for Secondary Diagnosis: Principal Problem:   MDD (major depressive disorder), recurrent episode, severe ([place]) Active Problems:   Suicidal ideation  Long Term Goal(s): Improvement in symptoms so as ready for discharge   Short Term Goals: Ability to identify changes in lifestyle to reduce recurrence of condition will improve Ability to verbalize feelings will improve Ability to disclose and discuss suicidal ideas Ability to demonstrate self-control will improve Ability to identify and develop effective coping behaviors will improve Compliance with prescribed medications will improve Ability to identify triggers associated with substance abuse/mental health issues will improve     Medication Management: Evaluate patient's response, side effects, and tolerance of medication regimen.  Therapeutic Interventions: 1 to 1 sessions, Unit Group sessions and Medication administration.  Evaluation of Outcomes: Not Met   RN Treatment Plan for Primary Diagnosis: MDD (major depressive disorder), recurrent episode, severe ([place]) Long Term Goal(s): Knowledge of disease and therapeutic regimen to maintain health will improve  Short Term Goals: Ability to remain free from injury will improve, Ability to verbalize frustration and anger appropriately will improve, Ability to demonstrate self-control, Ability to identify and develop effective coping behaviors will improve, and Compliance with prescribed medications will improve  Medication Management: RN will administer medications as ordered by provider, will assess and evaluate patient's response and provide education to patient for prescribed medication. RN will report any adverse and/or side effects to prescribing provider.  Therapeutic Interventions: 1 on 1 counseling sessions, Psychoeducation, Medication  administration, Evaluate responses to treatment, Monitor vital signs and CBGs as ordered, Perform/monitor CIWA, COWS, AIMS and Fall Risk screenings as ordered, Perform wound care treatments as ordered.  Evaluation of Outcomes: Not Met   LCSW Treatment Plan for Primary Diagnosis: MDD (major depressive disorder), recurrent episode, severe ([place]) Long Term Goal(s): Safe transition to appropriate next level of care at discharge, Engage patient in therapeutic group addressing interpersonal concerns.  Short Term Goals: Engage patient in aftercare planning with referrals and resources, Increase social support, Increase ability to appropriately verbalize feelings, Facilitate acceptance of mental health diagnosis and concerns, Identify triggers associated with mental health/substance abuse issues, and Increase skills for wellness and recovery  Therapeutic Interventions: Assess for all discharge needs, 1 to 1 time with Social worker, Explore available resources and support systems, Assess for adequacy in community support network, Educate family and significant other(s) on suicide prevention, Complete Psychosocial Assessment, Interpersonal group therapy.  Evaluation of Outcomes: Not Met   Progress in Treatment: Attending groups: No. Participating in groups: No. Taking medication as prescribed: Yes. Toleration medication: Yes. Family/Significant other contact made: No, will contact:  significant other Patient understands diagnosis: Yes. Discussing patient identified problems/goals with staff: Yes. Medical problems stabilized or resolved: Yes. Denies suicidal/homicidal ideation: Yes. Issues/concerns per patient self-inventory: No.   New problem(s) identified: No, Describe:  none  New Short Term/Long  Term Goal(s): detox, medication management for mood stabilization; elimination of SI thoughts; development of comprehensive mental wellness/sobriety plan   Patient Goals:  "To get stable"  Discharge  Plan or Barriers: Patient recently admitted. CSW will continue to follow and assess for appropriate referrals and possible discharge planning.    Reason for Continuation of Hospitalization: Anxiety Depression Medication stabilization  Estimated Length of Stay: 3-5 days  Attendees: Patient: Carlos Flowers 07/16/2021   Physician: Fatima Sanger, DO 07/16/2021   Nursing:  07/16/2021   RN Care Manager: 07/16/2021   Social Worker: Darletta Moll, LCSW 07/16/2021  Recreational Therapist:  07/16/2021   Other:  07/16/2021   Other:  07/16/2021   Other: 07/16/2021     Scribe for Treatment Team: Vassie Moselle, LCSW 07/16/2021 10:53 AM

## 2021-07-16 NOTE — B[facility] Counselor (Signed)
CSW spoke with [facility] [phone] and was informed by the charge nurse there that the Pt cannot return to the home now or in the future due to being IVC'ed from that facility.    CSW spoke with Maralyn Sago at [facility] [phone] who asked to speak with the Pt.  CSW spoke with the Pt afterwards who states that Maralyn Sago will attempt to contact [facility] and get the Pt readmitted.  The Pt reports that Maralyn Sago told him that if he cannot go to [facility] then he may be able to come back to [facility].  CSW will contact Maralyn Sago on 07/17/2021 to find out if the Pt can return or if she was able to get him back into [facility].

## 2021-07-17 ENCOUNTER — Encounter (HOSPITAL_COMMUNITY): Payer: Self-pay | Admitting: Adult Health

## 2021-07-17 DIAGNOSIS — F332 Major depressive disorder, recurrent severe without psychotic features: Secondary | ICD-10-CM | POA: Diagnosis not present

## 2021-07-17 DIAGNOSIS — F1010 Alcohol abuse, uncomplicated: Secondary | ICD-10-CM | POA: Diagnosis present

## 2021-07-17 LAB — LIPID PANEL
Cholesterol: 192 mg/dL (ref 0–200)
HDL: 48 mg/dL (ref >40)
LDL Cholesterol: 128 mg/dL — ABNORMAL HIGH (ref 0–99)
Total CHOL/HDL Ratio: 4 RATIO
Triglycerides: 82 mg/dL (ref <150)
VLDL: 16 mg/dL (ref 0–40)

## 2021-07-17 LAB — CBC WITH DIFFERENTIAL/PLATELET
Abs Immature Granulocytes: 0.04 10*3/uL (ref 0.00–0.07)
Basophils Absolute: 0.1 10*3/uL (ref 0.0–0.1)
Basophils Relative: 1 %
Eosinophils Absolute: 0.3 10*3/uL (ref 0.0–0.5)
Eosinophils Relative: 2 %
HCT: 46.1 % (ref 39.0–52.0)
Hemoglobin: 15.1 g/dL (ref 13.0–17.0)
Immature Granulocytes: 0 %
Lymphocytes Relative: 23 %
Lymphs Abs: 3 10*3/uL (ref 0.7–4.0)
MCH: 29.7 pg (ref 26.0–34.0)
MCHC: 32.8 g/dL (ref 30.0–36.0)
MCV: 90.7 fL (ref 80.0–100.0)
Monocytes Absolute: 1 10*3/uL (ref 0.1–1.0)
Monocytes Relative: 8 %
Neutro Abs: 8.3 10*3/uL — ABNORMAL HIGH (ref 1.7–7.7)
Neutrophils Relative %: 66 %
Platelets: 288 10*3/uL (ref 150–400)
RBC: 5.08 MIL/uL (ref 4.22–5.81)
RDW: 13.2 % (ref 11.5–15.5)
WBC: 12.7 10*3/uL — ABNORMAL HIGH (ref 4.0–10.5)
nRBC: 0 % (ref 0.0–0.2)

## 2021-07-17 LAB — TSH: TSH: 1.063 u[IU]/mL (ref 0.350–4.500)

## 2021-07-17 LAB — HEMOGLOBIN A1C
Hgb A1c MFr Bld: 5.6 % (ref 4.8–5.6)
Mean Plasma Glucose: 114.02 mg/dL

## 2021-07-17 MED ORDER — THIAMINE HCL 100 MG PO TABS
100.0000 mg | ORAL_TABLET | Freq: Every day | ORAL | Status: DC
  Administered 2021-07-17 – 2021-07-26 (×10): 100 mg via ORAL
  Filled 2021-07-17: qty 14
  Filled 2021-07-17 (×11): qty 1

## 2021-07-17 MED ORDER — ONDANSETRON 4 MG PO TBDP
4.0000 mg | ORAL_TABLET | Freq: Four times a day (QID) | ORAL | Status: AC | PRN
Start: 2021-07-17 — End: 2021-07-20

## 2021-07-17 NOTE — [facility] Counselor (Signed)
CSW provided the Pt with a packet of resources that includes: housing and shelter resources, free/reduced price food resources, information about the [facility], an application for a [employer], [place] cards, and suicide prevention information.

## 2021-07-17 NOTE — BHH Counselor (Signed)
CSW spoke with the receptionist at the [facility] program [phone], that stated that if the Pt has an ID or Social Security Card then they will accept him into the program.  CSW spoke with the Pt who states that he does not have an ID but did apply for one and it was scheduled to be sent to [facility].  CSW spoke with the receptionist at [facility] [phone] who states that they have not seen any mail for the Pt yet but will contact him when it arrives.  CSW will speak again with the [facility] program to see if there is any other way the Pt can be accepted there.

## 2021-07-17 NOTE — Progress Notes (Signed)
Patient did not attend the evening speaker AA meeting.  

## 2021-07-17 NOTE — Progress Notes (Addendum)
[facility]  [facility]    Patient Name: Carlos Flowers  MRN: [id number]  DOB: 1981-07-18   Hospital day 2  24-Hour Events:  The patient's chart and nursing notes were reviewed. The patient's case was discussed in multidisciplinary team meeting. Per MAR: - Patient is compliant with scheduled meds. - PRNs: Ativan 1mg  CIWA>10q12, Tylenol, Maalox, Atarax, milk of magnesia, trazodone  Patient has not received any PRNs for agitation or withdrawal symptoms. He declined labs this morning. He has not been to groups yet, but is amenable to going today.   Patient states that he did not take any sleep aid last night, and slept "on and off" throughout the night.   Subjective:  He states that he is having residual depression but admits that this is partly due to the news that he cannot return to [facility] after discharge. He states that he strongly desires continuation of treatment with [facility] and is disappointed he cannot return. He was encouraged to work with social work on alternative options for residential treatment after discharge. He stated "thinking about going back to drinking is really unappealing to me right now". He denies cravings for alcohol, and feels encouraged by his lack of cravings. He states "I know I have a long way to go, but I don't want to go back to drinking." He states that his past withdrawals have been helped only by Ativan. Discussed with patient the reasons why Ativan has not been given, including risk of dependence with Ativan use, and the fact that his withdrawal symptoms are not significant for CIWA scoring to warrant PRN use. He states that the gabapentin seems to be helping with cravings. He denies nausea, vomiting, diarrhea, constipation, chest pain, shortness of breath. He states that the bilateral mild hand tremor he was having yesterday has improved, and on exam it has improved. He states that his only new symptom is "some drowsiness during the day". He  slept "on and off" through the night. When asked about SI, he states "I feel like I am too far gone to be helped, so I think about not wanting to keep living". He can contract for safety on the unit and denies suicidal intent or plan. He denies HI. He states that he struggles with daily tasks such as taking a shower. He understands that his medications for depression will take time to fully kick in, and he wants to give them the full chance to work. He states that he "wants to get up and about and see some people" today, and states he is open to attending groups today. He denies AVH, ideas of reference, paranoia, delusions, or first rank symptoms.   Past Psychiatric History: -MDD -EtOH use- 5y history of "one case of beer per day" - Remote methamphetamine use - [facility] for SI and alcohol use disorder  Past Medical History: Past Medical History:  Diagnosis Date   Alcoholism (HCC)    Anxiety    Depression    Substance abuse (HCC)      Past Surgical History: History reviewed. No pertinent surgical history.    Family History: Family History  Problem Relation Age of Onset   Cancer Maternal Grandmother    Heart disease Paternal Grandmother    Heart disease Paternal Grandfather     Family psychiatric history: Depression on mother's side of family. No family history of suicide attempt or completion.   Allergies: Allergies  Allergen Reactions   Peanut-Containing Drug Products Anaphylaxis  Current Medications:  Current Facility-Administered Medications:    acetaminophen (TYLENOL) tablet 650 mg, 650 mg, Oral, Q6H PRN, Jola Babinski, Marlane Mingle, MD   busPIRone (BUSPAR) tablet 10 mg, 10 mg, Oral, TID, Jola Babinski Marlane Mingle, MD, 10 mg at 07/17/21 1235   carbamazepine (TEGRETOL) tablet 200 mg, 200 mg, Oral, BID, Antonieta Pert, MD, 200 mg at 07/17/21 0801   gabapentin (NEURONTIN) capsule 300 mg, 300 mg, Oral, TID, Antonieta Pert, MD, 300 mg at 07/17/21 1235    hydrOXYzine (ATARAX/VISTARIL) tablet 50 mg, 50 mg, Oral, Q6H PRN, Antonieta Pert, MD, 50 mg at 07/17/21 1010   loperamide (IMODIUM) capsule 4 mg, 4 mg, Oral, Q6H PRN, Antonieta Pert, MD   LORazepam (ATIVAN) tablet 1 mg, 1 mg, Oral, Q12H PRN, Antonieta Pert, MD   multivitamin with minerals tablet 1 tablet, 1 tablet, Oral, q morning, Jola Babinski, Marlane Mingle, MD, 1 tablet at 07/17/21 0801   nicotine polacrilex (NICORETTE) gum 2 mg, 2 mg, Oral, PRN, Antonieta Pert, MD, 2 mg at 07/17/21 1239   PARoxetine (PAXIL) tablet 20 mg, 20 mg, Oral, q morning, Jola Babinski, Marlane Mingle, MD, 20 mg at 07/17/21 0803   traZODone (DESYREL) tablet 50 mg, 50 mg, Oral, QHS PRN, Antonieta Pert, MD, 50 mg at 07/16/21 0002  Social History: Social History   Socioeconomic History   Marital status: Single    Spouse name: Not on file   Number of children: Not on file   Years of education: Not on file   Highest education level: Not on file  Occupational History   Not on file  Tobacco Use   Smoking status: Every Day    Packs/day: 1.00    Years: 17.00    Pack years: 17.00    Types: Cigarettes   Smokeless tobacco: Never  Vaping Use   Vaping Use: Never used  Substance and Sexual Activity   Alcohol use: Yes    Alcohol/week: 168.0 standard drinks    Types: 168 Cans of beer per week   Drug use: Not Currently    Types: Cocaine, Heroin, Methamphetamines    Comment: Last used methamphetamines "several months ago"   Sexual activity: Not Currently  Other Topics Concern   Not on file  Social History Narrative   Not on file   Social Determinants of Health   Financial Resource Strain: Not on file  Food Insecurity: Not on file  Transportation Needs: Not on file  Physical Activity: Not on file  Stress: Not on file  Social Connections: Not on file    Objective: Psychiatric Specialty Exam: Blood pressure (!) 123/101, pulse 84, temperature (!) 97.5 F (36.4 C), temperature source Oral, resp. rate 18, height  6' (1.829 m), weight 43.5 kg, SpO2 97 %.Body mass index is 13.02 kg/m.  General Appearance: Casually dressed, fair grooming  Eye Contact:  Good  Speech:  Normal Rate, clear, coherent  Volume:  Normal  Mood:  Anxious and Depressed  Affect:  Congruent  Thought Process:  Coherent, Goal Directed, and Linear  Orientation:  Full (Time, Place, and Person)  Thought Content:  Logical- Denies HI, AVH, paranoia, first-rank symptoms, delusions  Suicidal Thoughts:  Yes.  without intent/plan and can contract for safety on the unit  Homicidal Thoughts:  No  Memory:  Immediate;   Good Recent;   Fair Remote;   NA  Judgement:  Fair- desires treatment for alcohol use disorder  Insight:  Fair  Psychomotor Activity:  Normal  Concentration:  Concentration: Good  and Attention Span: Good  Recall:  NA  Fund of Knowledge:  Fair  Language:  Good  Akathisia:  No  AIMS (if indicated):    NA  Assets:  Communication Skills Desire for Improvement Resilience  ADL's:  Intact He has no physical trouble with ADLs, however states that he sometimes lacks motivation to shower.   Cognition:  WNL  Sleep:  Number of Hours: 6.3   Physical Exam: GEN: Well developed, in NAD HENT: NCAT RESP: Normal respiratory effort, breathing comfortably on RA EXT: Able to ambulate, grossly nl movement of extremities NEURO: Mild tremor noted in L hand   Labs: Basic Metabolic Panel: BMP Latest Ref Rng & Units 07/13/2021 04/13/2021 03/21/2021  Glucose 70 - 99 mg/dL 96 161(W) 960(A)  BUN 6 - 20 mg/dL 9 <5(W) 13  Creatinine 0.61 - 1.24 mg/dL 0.98 1.19 1.47  Sodium 135 - 145 mmol/L 136 136 135  Potassium 3.5 - 5.1 mmol/L 4.4 4.2 4.0  Chloride 98 - 111 mmol/L 103 96(L) 102  CO2 22 - 32 mmol/L 24 26 21(L)  Calcium 8.9 - 10.3 mg/dL 9.5 9.5 9.6    Complete Metabolic Panel: CMP Latest Ref Rng & Units 07/13/2021 04/13/2021 03/21/2021  Glucose 70 - 99 mg/dL 96 829(F) 621(H)  BUN 6 - 20 mg/dL 9 <0(Q) 13  Creatinine 0.61 - 1.24 mg/dL 6.57  8.46 9.62  Sodium 135 - 145 mmol/L 136 136 135  Potassium 3.5 - 5.1 mmol/L 4.4 4.2 4.0  Chloride 98 - 111 mmol/L 103 96(L) 102  CO2 22 - 32 mmol/L 24 26 21(L)  Calcium 8.9 - 10.3 mg/dL 9.5 9.5 9.6  Total Protein 6.5 - 8.1 g/dL 8.2(H) 7.5 9.2(H)  Total Bilirubin 0.3 - 1.2 mg/dL 0.9 9.5(M) 0.9  Alkaline Phos 38 - 126 U/L 84 111 83  AST 15 - 41 U/L 27 30 28   ALT 0 - 44 U/L 32 27 30    Complete Blood Count: CBC Latest Ref Rng & Units 07/13/2021 04/13/2021 03/21/2021  WBC 4.0 - 10.5 K/uL 10.8(H) 15.3(H) 8.2  Hemoglobin 13.0 - 17.0 g/dL 03/23/2021 84.1 32.4  Hematocrit 39.0 - 52.0 % 49.5 45.9 49.3  Platelets 150 - 400 K/uL 273 290 261    Urinalysis:    Component Value Date/Time   COLORURINE STRAW (A) 12/24/2018 1300   APPEARANCEUR CLEAR 12/24/2018 1300   LABSPEC 1.006 12/24/2018 1300   PHURINE 8.0 12/24/2018 1300   GLUCOSEU NEGATIVE 12/24/2018 1300   HGBUR NEGATIVE 12/24/2018 1300   BILIRUBINUR NEGATIVE 12/24/2018 1300   KETONESUR NEGATIVE 12/24/2018 1300   PROTEINUR NEGATIVE 12/24/2018 1300   NITRITE NEGATIVE 12/24/2018 1300   LEUKOCYTESUR NEGATIVE 12/24/2018 1300   Drugs of Abuse:     Component Value Date/Time   LABOPIA NONE DETECTED 07/13/2021 1220   COCAINSCRNUR NONE DETECTED 07/13/2021 1220   LABBENZ POSITIVE (A) 07/13/2021 1220   AMPHETMU NONE DETECTED 07/13/2021 1220   THCU NONE DETECTED 07/13/2021 1220   LABBARB NONE DETECTED 07/13/2021 1220     Alcohol Level:    Component Value Date/Time   ETH <10 07/13/2021 1138    Lipid Panel:     Component Value Date/Time   CHOL 142 03/24/2021 0650   TRIG 58 03/24/2021 0650   HDL 39 (L) 03/24/2021 0650   CHOLHDL 3.6 03/24/2021 0650   VLDL 12 03/24/2021 0650   LDLCALC 91 03/24/2021 0650     Assessment/Plan:  Treatment Plan Summary:  Principal Problem:   MDD (major depressive disorder), recurrent episode, severe (HCC)  Active Problems:   Suicidal ideation   Alcohol abuse R/o alcohol induced depressive d/o  Daily  contact with patient to assess and evaluate symptoms and progress in treatment, medication management, and plan. Jejuan Scala is a 40 y.o. male with a PPHx of 5y history of alcohol use disorder, MDD, and prior hospitalization for MDD w/SI and alcohol use disorder who presented via involuntary admission for SI without a plan .   MDD without psychotic features (r/o alcohol induced depressive d/o) - Continue Paxil 20mg  qd for depression - Continue Buspar 10mg  TID for anxiety - Continue Tegretol 200 mg BID for mood stability (AST 27 and ALT 32 on admission; WBC 10.8, H/H 16.1/49.5 and platelets 273 on admission -will check Tegretol level, LFTs, and CBC in 2-3 days for monitoring) - Vistaril 50mg  q6 hours PRN anxiety - Trazodone 50mg  qhs PRN insomnia  Alcohol use disorder  - CIWA protocol: 1mg  Ativan for CIWA >10, q12h with oral MVI and thiamine replacement - Gabapentin 300 mg TID for alcohol cravings/anxiety - Imodium 4mg  q6h PRN diarrhea - Zofran 4mg  q6h PRN nausea - Social work inquiring about options for residential rehab at discharge  Admission labs pending: Lipid panel, TSH, A1c, repeat CBC, EKG -----  , MS3 [facility] 07/17/21  Attestation for Student Documentation:   I personally was present and performed or re-performed the history, physical exam and medical decision-making activities of this service and have verified that the service and findings are accurately documented in the student's note. , MD, 

## 2021-07-17 NOTE — B[facility] Counselor (Signed)
CSW spoke with the Pt about not being able to go back to [facility] or [facility].  CSW informed the Pt that she would put in referrals for [facility] and [facility].  The Pt reports that he has been to both of these centers in the past 6 months and walked out of both of them because he "did not like them".  The Pt states that he is not sure where he would like to go from [facility] and states that he does not feel like he would do well in a shelter.  CSW explained that she would continue to work on referrals to programs that would take individuals with no income or insurance but explained that his options are limited at this time.  The Pt reports that he will use the packet that CSW provided him to call some housing and shelter resources today.  CSW will check back in with the Pt, [facility], and [facility] tomorrow (07/18/2021).

## 2021-07-17 NOTE — B[facility] Counselor (Signed)
CSW spoke with Aggie Cosier at [facility] who stated that the Pt was referred to them from a crisis program and spent one week at their facility until he was referred to [facility] for a higher level of care.  She states that he spent 1 week in [facility] before coming to [facility].  Aggie Cosier states that she believes that the Pt needs a higher level of care such as a long term mental health program.  CSW attempted to explain that there is also a substance use issue and was told "No he needs mental health treatment over the substance issue.  Tell him when he leaves from your facility to go to [facility]".  CSW asked Aggie Cosier directly if the Pt could return to [facility] and Aggie Cosier stated "No he cannot come back here".  CSW thanked Aggie Cosier and ended the call.

## 2021-07-17 NOTE — Progress Notes (Signed)
Recreation Therapy Notes  Animal-Assisted Activity (AAA) Program Checklist/Progress Notes Patient Eligibility Criteria Checklist & Daily Group note for Rec Tx Intervention  Date: 8.16.22 Time: 1430 Location: [address]  AAA/T Program Assumption of Risk Form signed by Patient/ or Parent Legal Guardian YES   Patient is free of allergies or severe asthma  YES   Patient reports no fear of animals  YES   Patient reports no history of cruelty to animals YES   Patient understands his/her participation is voluntary  YES   Patient washes hands before animal contact  YES  Patient washes hands after animal contact  YES  Education: Charity fundraiser, Appropriate Animal Interaction   Education Outcome: Acknowledges understanding/In group clarification offered/Needs additional education.   Clinical Observations/Feedback: Pt did not attend group session.    Caroll Rancher, LRT/CTRS        Caroll Rancher A 07/17/2021 3:55 PM

## 2021-07-17 NOTE — Progress Notes (Signed)
Pt refused labs this morning.  

## 2021-07-18 DIAGNOSIS — F332 Major depressive disorder, recurrent severe without psychotic features: Secondary | ICD-10-CM | POA: Diagnosis not present

## 2021-07-18 NOTE — Progress Notes (Signed)
[facility]  Progress Note    Patient Name: Carlos Flowers  MRN: [id number]  DOB: 07-29-1981   Hospital day 2  24-Hour Events:  The patient's chart and nursing notes were reviewed. The patient's case was discussed in multidisciplinary team meeting. Per MAR: - Patient is compliant with scheduled meds. - PRNs: Ativan 1mg  CIWA>10q12, Tylenol, Maalox, Atarax, milk of magnesia, trazodone  Patient has not received any PRNs for agitation or withdrawal symptoms. He had labs drawn. He has not been to groups yet.  Subjective:  He states that he is still feeling depressed and that he feels frustrated by the news of not having a rehab center to return to as of right now. He was encouraged to work with social work on alternative options. When asked about suicidal ideation, he states, "I will admit that I have been thinking about dying, I don't have motivation to be alive." He denies a plan to complete suicide. He denies homicidal ideation, AVH, first rank symptoms, delusions, or paranoia. He denies nausea, vomiting, diarrhea, constipation, chest pain, shortness of breath. He states that he did not sleep well last night. He did not attend group yesterday, and was encouraged to do so today.   Options for rehab post-discharge were discussed. He understands that he cannot go back to [facility] because he was involuntarily admitted to [facility]. He has left several inpatient rehab facilities in the past, and understands that leaving a facility can make it difficult to be placed back in that facility in the future. The [facility] is being considered as an option for him, but this facility requires an ID or [employer]. 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 He was encouraged to continue to look into his options. He does not have the funds required for up-front costs for [facility] or [facility].   Past Psychiatric History: -MDD -EtOH use- 5y  history of "one case of beer per day" - Remote methamphetamine use - Hospitalization at [facility] in April 2022 for SI and alcohol use disorder  Past Medical History: Past Medical History:  Diagnosis Date   Alcoholism (HCC)    Anxiety    Depression    Substance abuse (HCC)      Past Surgical History: History reviewed. No pertinent surgical history.    Family History: Family History  Problem Relation Age of Onset   Cancer Maternal Grandmother    Heart disease Paternal Grandmother    Heart disease Paternal Grandfather     Family psychiatric history: Depression on mother's side of family. No family history of suicide attempt or completion.   Allergies: Allergies  Allergen Reactions   Peanut-Containing Drug Products Anaphylaxis    Current Medications:  Current Facility-Administered Medications:    acetaminophen (TYLENOL) tablet 650 mg, 650 mg, Oral, Q6H PRN, May 2022, Jola Babinski, MD   busPIRone (BUSPAR) tablet 10 mg, 10 mg, Oral, TID, Marlane Mingle Jola Babinski, MD, 10 mg at 07/18/21 0757   carbamazepine (TEGRETOL) tablet 200 mg, 200 mg, Oral, BID, 07/20/21, MD, 200 mg at 07/18/21 0758   gabapentin (NEURONTIN) capsule 300 mg, 300 mg, Oral, TID, 07/20/21, MD, 300 mg at 07/18/21 0756   hydrOXYzine (ATARAX/VISTARIL) tablet 50 mg, 50 mg, Oral, Q6H PRN, 07/20/21, MD, 50 mg at 07/18/21 0758   loperamide (IMODIUM) capsule 4 mg, 4 mg, Oral, Q6H PRN, 07/20/21, MD   LORazepam (ATIVAN) tablet 1 mg,  1 mg, Oral, Q12H PRN, Jola Babinski Marlane Mingle, MD   multivitamin with minerals tablet 1 tablet, 1 tablet, Oral, q morning, Jola Babinski, Marlane Mingle, MD, 1 tablet at 07/17/21 0801   nicotine polacrilex (NICORETTE) gum 2 mg, 2 mg, Oral, PRN, Antonieta Pert, MD, 2 mg at 07/18/21 0757   ondansetron (ZOFRAN-ODT) disintegrating tablet 4 mg, 4 mg, Oral, Q6H PRN, Mason Jim, Tailor Lucking E, MD   PARoxetine (PAXIL) tablet 20 mg, 20 mg, Oral, q morning, Jola Babinski, Marlane Mingle, MD, 20 mg at 07/18/21  0757   thiamine tablet 100 mg, 100 mg, Oral, Daily, Asani Mcburney E, MD, 100 mg at 07/18/21 0756   traZODone (DESYREL) tablet 50 mg, 50 mg, Oral, QHS PRN, Antonieta Pert, MD, 50 mg at 07/16/21 0002  Social History: Social History   Socioeconomic History   Marital status: Single    Spouse name: Not on file   Number of children: Not on file   Years of education: Not on file   Highest education level: Not on file  Occupational History   Not on file  Tobacco Use   Smoking status: Every Day    Packs/day: 1.00    Years: 17.00    Pack years: 17.00    Types: Cigarettes   Smokeless tobacco: Never  Vaping Use   Vaping Use: Never used  Substance and Sexual Activity   Alcohol use: Yes    Alcohol/week: 168.0 standard drinks    Types: 168 Cans of beer per week   Drug use: Not Currently    Types: Cocaine, Heroin, Methamphetamines    Comment: Last used methamphetamines "several months ago"   Sexual activity: Not Currently  Other Topics Concern   Not on file  Social History Narrative   Not on file   Social Determinants of Health   Financial Resource Strain: Not on file  Food Insecurity: Not on file  Transportation Needs: Not on file  Physical Activity: Not on file  Stress: Not on file  Social Connections: Not on file    Objective: Psychiatric Specialty Exam: Blood pressure 117/74, pulse 74, temperature 97.8 F (36.6 C), temperature source Oral, resp. rate 18, height 6' (1.829 m), weight 43.5 kg, SpO2 99 %.Body mass index is 13.02 kg/m.  General Appearance: Casually dressed, fair grooming  Eye Contact:  Good  Speech:  Normal Rate, clear, coherent  Volume:  Normal  Mood:  Anxious and Depressed  Affect:  Congruent  Thought Process:  Coherent, Goal Directed, and Linear  Orientation:  Full (Time, Place, and Person)  Thought Content:  Logical- Denies HI, AVH, paranoia, first-rank symptoms, delusions  Suicidal Thoughts:  Yes.  without intent/plan and can contract for safety  on the unit  Homicidal Thoughts:  No  Memory:  Immediate;   Good Recent;   Fair Remote;   NA  Judgement:  Fair- desires treatment for alcohol use disorder  Insight:  Fair  Psychomotor Activity:  Normal  Concentration:  Concentration: Good and Attention Span: Good  Recall:  [employer] of Knowledge:  Fair  Language:  Good  Akathisia:  No  AIMS (if indicated):    NA  Assets:  Communication Skills Desire for Improvement Resilience  ADL's:  Intact   Cognition:  WNL  Sleep:  Number of Hours: 6.75   Physical Exam: GEN: Well developed, in NAD HENT: NCAT RESP: Normal respiratory effort, breathing comfortably on RA EXT: Able to ambulate, grossly nl movement of extremities   Labs: Basic Metabolic Panel: BMP Latest Ref Rng &  Units 07/13/2021 04/13/2021 03/21/2021  Glucose 70 - 99 mg/dL 96 161(W107(H) 960(A101(H)  BUN 6 - 20 mg/dL 9 <5(W<5(L) 13  Creatinine 0.61 - 1.24 mg/dL 0.980.87 1.191.01 1.471.02  Sodium 135 - 145 mmol/L 136 136 135  Potassium 3.5 - 5.1 mmol/L 4.4 4.2 4.0  Chloride 98 - 111 mmol/L 103 96(L) 102  CO2 22 - 32 mmol/L 24 26 21(L)  Calcium 8.9 - 10.3 mg/dL 9.5 9.5 9.6    Complete Metabolic Panel: CMP Latest Ref Rng & Units 07/13/2021 04/13/2021 03/21/2021  Glucose 70 - 99 mg/dL 96 829(F107(H) 621(H101(H)  BUN 6 - 20 mg/dL 9 <0(Q<5(L) 13  Creatinine 0.61 - 1.24 mg/dL 6.570.87 8.461.01 9.621.02  Sodium 135 - 145 mmol/L 136 136 135  Potassium 3.5 - 5.1 mmol/L 4.4 4.2 4.0  Chloride 98 - 111 mmol/L 103 96(L) 102  CO2 22 - 32 mmol/L 24 26 21(L)  Calcium 8.9 - 10.3 mg/dL 9.5 9.5 9.6  Total Protein 6.5 - 8.1 g/dL 8.2(H) 7.5 9.2(H)  Total Bilirubin 0.3 - 1.2 mg/dL 0.9 9.5(M1.5(H) 0.9  Alkaline Phos 38 - 126 U/L 84 111 83  AST 15 - 41 U/L 27 30 28   ALT 0 - 44 U/L 32 27 30    Complete Blood Count: CBC Latest Ref Rng & Units 07/17/2021 07/13/2021 04/13/2021  WBC 4.0 - 10.5 K/uL 12.7(H) 10.8(H) 15.3(H)  Hemoglobin 13.0 - 17.0 g/dL 84.115.1 32.416.1 40.115.3  Hematocrit 39.0 - 52.0 % 46.1 49.5 45.9  Platelets 150 - 400 K/uL 288 273 290     Urinalysis:    Component Value Date/Time   COLORURINE STRAW (A) 12/24/2018 1300   APPEARANCEUR CLEAR 12/24/2018 1300   LABSPEC 1.006 12/24/2018 1300   PHURINE 8.0 12/24/2018 1300   GLUCOSEU NEGATIVE 12/24/2018 1300   HGBUR NEGATIVE 12/24/2018 1300   BILIRUBINUR NEGATIVE 12/24/2018 1300   KETONESUR NEGATIVE 12/24/2018 1300   PROTEINUR NEGATIVE 12/24/2018 1300   NITRITE NEGATIVE 12/24/2018 1300   LEUKOCYTESUR NEGATIVE 12/24/2018 1300   Drugs of Abuse:     Component Value Date/Time   LABOPIA NONE DETECTED 07/13/2021 1220   COCAINSCRNUR NONE DETECTED 07/13/2021 1220   LABBENZ POSITIVE (A) 07/13/2021 1220   AMPHETMU NONE DETECTED 07/13/2021 1220   THCU NONE DETECTED 07/13/2021 1220   LABBARB NONE DETECTED 07/13/2021 1220     Alcohol Level:    Component Value Date/Time   ETH <10 07/13/2021 1138    Lipid Panel:     Component Value Date/Time   CHOL 192 07/17/2021 1832   TRIG 82 07/17/2021 1832   HDL 48 07/17/2021 1832   CHOLHDL 4.0 07/17/2021 1832   VLDL 16 07/17/2021 1832   LDLCALC 128 (H) 07/17/2021 1832     Assessment/Plan:  Treatment Plan Summary:  Principal Problem:   MDD (major depressive disorder), recurrent episode, severe (HCC) Active Problems:   Suicidal ideation   Alcohol abuse R/o alcohol induced depressive d/o  Daily contact with patient to assess and evaluate symptoms and progress in treatment, medication management, and plan. Nunzio CoryMarcus Myers Mcclean is a 40 y.o. male with a PPHx of 5y history of alcohol use disorder, MDD, and prior hospitalization for MDD w/SI and alcohol use disorder who presented via involuntary admission for SI without a plan .   MDD without psychotic features (r/o alcohol induced depressive d/o) - Continue Paxil 20mg  qd for depression - Continue Buspar 10mg  TID for anxiety - Continue Tegretol 200 mg BID for mood stability (AST 27 and ALT 32 on admission; WBC 10.8, H/H  16.1/49.5 and platelets 273 on admission -will check Tegretol  level, LFTs, and CBC Friday for monitoring) - Vistaril 50mg  q6 hours PRN anxiety - Trazodone 50mg  qhs PRN insomnia  Alcohol use disorder  - CIWA protocol: 1mg  Ativan for CIWA >10, q12h with oral MVI and thiamine replacement (recent scores 0) - Gabapentin 300 mg TID for alcohol cravings/anxiety - Imodium 4mg  q6h PRN diarrhea - Zofran 4mg  q6h PRN nausea - Social work inquiring about options for residential rehab at discharge  Admission labs: Lipid panel WNL except fro LDL 128; TSH 1.063, A1c 5.6, WBC 12.7 H/H 15.1/46.1 and platelets 288; EKG shows NSR 82 bpm with QTC 432 ms  Leukocytosis - will need to see PCP after discharge for trending - appears chronically elevated when compared to previous labs (WBC 15.4 and 14.1 4 months ago, 15.3 3 months ago) -----  , MS3 [facility] 07/18/21  Attestation for Student Documentation:   I personally was present and performed or re-performed the history, physical exam and medical decision-making activities of this service and have verified that the service and findings are accurately documented in the student's note. 11-15-1983, MD, Filomena Jungling

## 2021-07-18 NOTE — [facility] Suicide Risk Assessment (Signed)
[facility] INPATIENT:  Family/Significant Other Suicide Prevention Education  Suicide Prevention Education:  Contact Attempts: [name] [phone] (Girlfriend) has been identified by the patient as the family member/significant other with whom the patient will be residing, and identified as the person(s) who will aid the patient in the event of a mental health crisis.  With written consent from the patient, two attempts were made to provide suicide prevention education, prior to and/or following the patient's discharge.  We were unsuccessful in providing suicide prevention education.  A suicide education pamphlet was given to the patient to share with family/significant other.  Date and time of first attempt: 07/17/2021 at 10:09am Date and time of second attempt: 07/18/2021 at 10:00am  Aram Beecham 07/18/2021, 11:27 AM

## 2021-07-18 NOTE — [facility] Group Notes (Signed)
Type of Therapy and Topic:  Group Therapy:  Self-Esteem   Participation Level:  Did not attend   Description of Group: This group addressed positive self-esteem. Patients were given a worksheet with a blank shield. Patients were asked what a shield is and when it is used. Patients were asked to list, draw, or write protective factors in the their lives on their shields. Patients discussed the words, ideas and drawings that they put on their shield. Patients were encouraged to have a daily reflection of positive characteristics/ protective factors.  Therapeutic Goals Patient will verbalize two of their positive qualities Patient will demonstrate insight but naming social supports in their lives Patient will verbalize their feelings when voicing positive self affirmations and when voicing positive affirmations of others Patients will discuss the potential positive impact on their wellness/recovery of focusing on positive traits of self and others.  Summary of Patient Progress:    Did not attend  

## 2021-07-18 NOTE — Progress Notes (Signed)
   07/18/21 1800  Psych Admission Type (Psych Patients Only)  Admission Status Involuntary  Psychosocial Assessment  Patient Complaints Anxiety  Eye Contact Brief  Facial Expression Flat  Affect Appropriate to circumstance  Speech Logical/coherent;Soft  Interaction Isolative  Motor Activity Other (Comment) (wnl)  Appearance/Hygiene Improved  Behavior Characteristics Cooperative  Mood Depressed;Anxious  Thought Process  Coherency Circumstantial  Content WDL  Delusions None reported or observed  Perception WDL  Hallucination None reported or observed  Judgment Poor  Confusion None  Danger to Self  Current suicidal ideation? Denies  Danger to Others  Danger to Others None reported or observed  Patient compliant with medication Denies SI/HI/A/VH no adverse drug noted. Will continue to monitor.

## 2021-07-18 NOTE — Progress Notes (Signed)
Recreation Therapy Notes  Date:  8.17.22 Time: 0930 Location: 300 Hall Dayroom  Group Topic: Stress Management  Goal Area(s) Addresses:  Patient will identify positive stress management techniques. Patient will identify benefits of using stress management post d/c.  Intervention: Stress Management  Activity:  Meditation.  LRT played Flowers meditation that focused on calming anxiety.  Meditation spoke of acknowledging your feelings but not letting them take over your thoughts and emotions.  Education:  Stress Management, Discharge Planning.   Education Outcome: Acknowledges Education  Clinical Observations/Feedback: Pt did not attend group session.    Carlos Flowers 07/18/2021 12:03 PM

## 2021-07-18 NOTE — Progress Notes (Signed)
   07/17/21 2000  Psych Admission Type (Psych Patients Only)  Admission Status Involuntary  Psychosocial Assessment  Patient Complaints Anxiety;Depression  Eye Contact Brief  Facial Expression Flat  Affect Appropriate to circumstance  Speech Logical/coherent;Soft  Interaction Isolative  Motor Activity Other (Comment) (wnl)  Appearance/Hygiene Improved  Behavior Characteristics Cooperative  Mood Anxious;Depressed  Thought Process  Coherency Circumstantial  Content WDL  Delusions None reported or observed  Perception WDL  Hallucination None reported or observed  Judgment Poor  Confusion None  Danger to Self  Current suicidal ideation? Denies  Danger to Others  Danger to Others None reported or observed   Pt denies SI, HI, AVH and pain. Pt rates anxiety 5/10 (after receiving Vistaril PRN) and depression 10/10. Pt cooperative with performing EKG. Pt minimal and guarded. Per day shift RN, pt having cravings for alcohol and not actual withdrawal symptoms. Pt also has chronic anxiety.

## 2021-07-18 NOTE — B[facility] Counselor (Signed)
CSW contacted [facility] to inquire about the referral that was sent.  CSW left a voicemail for [place] in Admissions asking for a return call to discuss the progress of this referral.

## 2021-07-18 NOTE — B[facility] Counselor (Signed)
CSW contacted [facility] who states that the Pt will need to contact them and complete a pre-screen today before they can discuss when a bed would be available.  CSW provided the Pt with the phone number to complete the pre-screen.  CSW will follow up with [facility] tomorrow (07/19/21) to see if the Pt has been accepted.

## 2021-07-18 NOTE — BHH Counselor (Signed)
CSW spoke with Carlos Flowers at [facility] who states that the Pt has been accepted for 07/23/21 at 9:00am.  The Pt will need a 30 day supply of medications with 1 refill.

## 2021-07-19 DIAGNOSIS — F332 Major depressive disorder, recurrent severe without psychotic features: Secondary | ICD-10-CM | POA: Diagnosis not present

## 2021-07-19 MED ORDER — CETAPHIL MOISTURIZING EX LOTN
TOPICAL_LOTION | CUTANEOUS | Status: DC | PRN
  Filled 2021-07-19: qty 473

## 2021-07-19 MED ORDER — CARBAMAZEPINE 200 MG PO TABS
100.0000 mg | ORAL_TABLET | Freq: Two times a day (BID) | ORAL | Status: AC
  Administered 2021-07-19 – 2021-07-22 (×6): 100 mg via ORAL
  Filled 2021-07-19 (×3): qty 0.5
  Filled 2021-07-19: qty 1
  Filled 2021-07-19 (×3): qty 0.5

## 2021-07-19 MED ORDER — QUETIAPINE FUMARATE 50 MG PO TABS
50.0000 mg | ORAL_TABLET | Freq: Every day | ORAL | Status: DC
  Administered 2021-07-19: 50 mg via ORAL
  Filled 2021-07-19 (×4): qty 1

## 2021-07-19 NOTE — Progress Notes (Signed)
Pt did not attend, Pt was in bed after staff announced Orientation group.

## 2021-07-19 NOTE — Progress Notes (Signed)
Psychoeducational Group Note  Date:  07/19/2021 Time:  2015  Group Topic/Focus:  Wrap up group  Participation Level: Did Not Attend  Participation Quality:  Not Applicable  Affect:  Not Applicable  Cognitive:  Not Applicable  Insight:  Not Applicable  Engagement in Group: Not Applicable  Additional Comments:  Did not attend,   Marcille Buffy 07/19/2021, 9:16 PM

## 2021-07-19 NOTE — Progress Notes (Signed)
Carlos Flowers was in his room much of the evening.  He continues to report passive SI and verbally agrees to seek out staff if those thoughts worsen.  He denied HI or AVH.  He reported his depression 10/10 and anxiety 5/10 (10 the worst).  Q 15 minute checks maintained for safety.  He remains safe on the unit.   07/18/21 2125  Psych Admission Type (Psych Patients Only)  Admission Status Involuntary  Psychosocial Assessment  Patient Complaints Anxiety;Insomnia;Depression  Eye Contact Fair  Facial Expression Flat;Sad  Affect Sad  Speech Logical/coherent;Soft  Interaction Isolative  Motor Activity Slow  Appearance/Hygiene Unremarkable  Behavior Characteristics Cooperative;Appropriate to situation  Mood Depressed;Anxious  Thought Process  Coherency Circumstantial  Content WDL  Delusions None reported or observed  Perception WDL  Hallucination None reported or observed  Judgment Poor  Confusion None  Danger to Self  Current suicidal ideation? Passive  Self-Injurious Behavior Some self-injurious ideation observed or expressed.  No lethal plan expressed   Agreement Not to Harm Self Yes  Description of Agreement Verbal agreement to not harm self on the unit  Danger to Others  Danger to Others None reported or observed

## 2021-07-19 NOTE — Progress Notes (Signed)
Patient is calm and cooperative with assessment. He is alert and oriented x4. Patient endorses passive SI without a plan but is able to contract for safety. Patient denies HI and AVH. Patient says he has not had an appetite but is attempting to eat. Patient is observed to be eating his breakfast at 10am. Patient is compliant with scheduled medications and requests nicotine gum with his morning and lunchtime medications.   Patient remains isolative to his room and does not attend any groups his morning and afternoon. Patient is encouraged to come out more often. Patient remains safe on the unit at this time.

## 2021-07-19 NOTE — [facility] Group Notes (Signed)
Did not attend group 

## 2021-07-19 NOTE — [facility] Group Notes (Signed)
Pt. Didn't attend group. 

## 2021-07-19 NOTE — Progress Notes (Addendum)
[facility]  Progress Note    Patient Name: Carlos Flowers  MRN: [id number]  DOB: 08/17/1981   Hospital day 3  24-Hour Events:  The patient's chart and nursing notes were reviewed. The patient's case was discussed in multidisciplinary team meeting. Per MAR: - Patient is compliant with scheduled meds. - PRNs: Ativan 1mg  CIWA>10q12, Tylenol, Maalox, Atarax, milk of magnesia, trazodone  Patient has not received any PRNs for agitation or withdrawal symptoms. He had labs drawn. He has not been to groups yet.  Subjective:  He states that he is feeling depressed and endorses passive suicidal ideation. He states that he "just doesn't want to be alive".He states that he "would never do anything to actually hurt myself." He denies a plan to complete suicide. He denies homicidal ideation, AVH, first rank symptoms, or delusions but reports general sense of paranoia. When asked to clarify, he describes sense that he is being watched. He denies nausea, vomiting, diarrhea, constipation, chest pain, shortness of breath. He states that he did not sleep well last night. He did not attend group yesterday, and was encouraged to do so today.   He states that the hardest parts about this hospitalization have been losing a relationship with a woman he was seeing prior to hospitalization as well as "quitting all of it all at once", which was in reference to stopping both alcohol and nicotine at the same time.  Options for rehab post-discharge were discussed. He has been accepted at both [facility] and [facility] for post-discharge rehab. He states that he would prefer [facility], but is willing to start any residential program that is available. 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 He states that he is worried about [facility]'s policy against cigarettes and would like nicotine gum to manage his nicotine cravings. He states that "it's all overwhelming" when asked how he is  feeling about discharge and seeking treatment. He states that he is at a 10/10 on the readiness scale to stop using alcohol, stating that alcohol has "caused me to lose everything", and he does not want to go back to that. He desires improvement in his mood and voices hope that his future can improve if he is able to complete a residential rehab program.   Since he does not have present mood instability, has no h/o mania, is already on Neurontin to help with cravings/potential withdrawal, and due to P450 interactions with use of Tegretol, team recommends tapering Tegretol. We discussed starting on Seroquel to help with sleep, residual anxiety, and paranoia and to augment as treatment for depression. The r/b/se/a to Seroquel were discussed including risks of weight gain, sedation, TD/EPS, and development of DM and high cholesterol and he consents to med trial.   Past Psychiatric History: -MDD -EtOH use- 5y history of "one case of beer per day" - Remote methamphetamine use - Hospitalization at [facility] in April 2022 for SI and alcohol use disorder  Past Medical History: Past Medical History:  Diagnosis Date   Alcoholism (HCC)    Anxiety    Depression    Substance abuse (HCC)      Past Surgical History: History reviewed. No pertinent surgical history.    Family History: Family History  Problem Relation Age of Onset   Cancer Maternal Grandmother    Heart disease Paternal Grandmother    Heart disease Paternal Grandfather     Family psychiatric history: Depression on mother's side of family. No  family history of suicide attempt or completion.   Allergies: Allergies  Allergen Reactions   Peanut-Containing Drug Products Anaphylaxis    Current Medications:  Current Facility-Administered Medications:    acetaminophen (TYLENOL) tablet 650 mg, 650 mg, Oral, Q6H PRN, Jola Babinski, Marlane Mingle, MD   busPIRone (BUSPAR) tablet 10 mg, 10 mg, Oral, TID, Jola Babinski Marlane Mingle, MD, 10 mg at 07/19/21 1122    carbamazepine (TEGRETOL) tablet 200 mg, 200 mg, Oral, BID, Antonieta Pert, MD, 200 mg at 07/19/21 0810   gabapentin (NEURONTIN) capsule 300 mg, 300 mg, Oral, TID, Antonieta Pert, MD, 300 mg at 07/19/21 1122   hydrOXYzine (ATARAX/VISTARIL) tablet 50 mg, 50 mg, Oral, Q6H PRN, Antonieta Pert, MD, 50 mg at 07/19/21 1122   loperamide (IMODIUM) capsule 4 mg, 4 mg, Oral, Q6H PRN, Antonieta Pert, MD   LORazepam (ATIVAN) tablet 1 mg, 1 mg, Oral, Q12H PRN, Antonieta Pert, MD   multivitamin with minerals tablet 1 tablet, 1 tablet, Oral, q morning, Jola Babinski, Marlane Mingle, MD, 1 tablet at 07/19/21 1122   nicotine polacrilex (NICORETTE) gum 2 mg, 2 mg, Oral, PRN, Antonieta Pert, MD, 2 mg at 07/19/21 0810   ondansetron (ZOFRAN-ODT) disintegrating tablet 4 mg, 4 mg, Oral, Q6H PRN, Mason Jim, Kashaun Bebo E, MD   PARoxetine (PAXIL) tablet 20 mg, 20 mg, Oral, q morning, Jola Babinski, Marlane Mingle, MD, 20 mg at 07/19/21 1610   thiamine tablet 100 mg, 100 mg, Oral, Daily, Diarra Ceja E, MD, 100 mg at 07/19/21 0810   traZODone (DESYREL) tablet 50 mg, 50 mg, Oral, QHS PRN, Antonieta Pert, MD, 50 mg at 07/18/21 2125  Social History: Social History   Socioeconomic History   Marital status: Single    Spouse name: Not on file   Number of children: Not on file   Years of education: Not on file   Highest education level: Not on file  Occupational History   Not on file  Tobacco Use   Smoking status: Every Day    Packs/day: 1.00    Years: 17.00    Pack years: 17.00    Types: Cigarettes   Smokeless tobacco: Never  Vaping Use   Vaping Use: Never used  Substance and Sexual Activity   Alcohol use: Yes    Alcohol/week: 168.0 standard drinks    Types: 168 Cans of beer per week   Drug use: Not Currently    Types: Cocaine, Heroin, Methamphetamines    Comment: Last used methamphetamines "several months ago"   Sexual activity: Not Currently  Other Topics Concern   Not on file  Social History Narrative    Not on file   Social Determinants of Health   Financial Resource Strain: Not on file  Food Insecurity: Not on file  Transportation Needs: Not on file  Physical Activity: Not on file  Stress: Not on file  Social Connections: Not on file    Objective: Psychiatric Specialty Exam: Blood pressure 110/76, pulse 85, temperature 97.9 F (36.6 C), temperature source Oral, resp. rate 17, height 6' (1.829 m), weight 43.5 kg, SpO2 99 %.Body mass index is 13.02 kg/m.  General Appearance: Casually dressed, fair grooming, lying in bed for interview  Eye Contact:  Good  Speech:  Normal Rate, clear, coherent  Volume:  Normal  Mood:  Anxious and Depressed  Affect:  Congruent  Thought Process:  Coherent, Goal Directed, and Linear  Orientation:  Full (Time, Place, and Person)  Thought Content:  Logical- Denies HI, AVH,  first-rank  symptoms, delusions but endorses paranoia with sense he is being watched  Suicidal Thoughts:  Yes.  without intent/plan and can contract for safety on the unit  Homicidal Thoughts:  No  Memory:  Immediate;   Good Recent;   Fair Remote;   NA  Judgement:  Fair- desires treatment for alcohol use disorder  Insight:  Fair  Psychomotor Activity:  Normal  Concentration:  Concentration: Good and Attention Span: Good  Recall:  [employer] of Knowledge:  Fair  Language:  Good  Akathisia:  No  AIMS (if indicated):    NA  Assets:  Communication Skills Desire for Improvement Resilience  ADL's:  Intact   Cognition:  WNL  Sleep:  Number of Hours: 6.75   Physical Exam: GEN: Well developed, in NAD HENT: NCAT RESP: Normal respiratory effort, breathing comfortably on RA Skin: Erythematous, scaling rash noted over L palm   Labs: Basic Metabolic Panel: BMP Latest Ref Rng & Units 07/13/2021 04/13/2021 03/21/2021  Glucose 70 - 99 mg/dL 96 098(J) 191(Y)  BUN 6 - 20 mg/dL 9 <7(W) 13  Creatinine 0.61 - 1.24 mg/dL 2.95 6.21 3.08  Sodium 135 - 145 mmol/L 136 136 135  Potassium 3.5  - 5.1 mmol/L 4.4 4.2 4.0  Chloride 98 - 111 mmol/L 103 96(L) 102  CO2 22 - 32 mmol/L 24 26 21(L)  Calcium 8.9 - 10.3 mg/dL 9.5 9.5 9.6    Complete Metabolic Panel: CMP Latest Ref Rng & Units 07/13/2021 04/13/2021 03/21/2021  Glucose 70 - 99 mg/dL 96 657(Q) 469(G)  BUN 6 - 20 mg/dL 9 <2(X) 13  Creatinine 0.61 - 1.24 mg/dL 5.28 4.13 2.44  Sodium 135 - 145 mmol/L 136 136 135  Potassium 3.5 - 5.1 mmol/L 4.4 4.2 4.0  Chloride 98 - 111 mmol/L 103 96(L) 102  CO2 22 - 32 mmol/L 24 26 21(L)  Calcium 8.9 - 10.3 mg/dL 9.5 9.5 9.6  Total Protein 6.5 - 8.1 g/dL 8.2(H) 7.5 9.2(H)  Total Bilirubin 0.3 - 1.2 mg/dL 0.9 0.1(U) 0.9  Alkaline Phos 38 - 126 U/L 84 111 83  AST 15 - 41 U/L 27 30 28   ALT 0 - 44 U/L 32 27 30    Complete Blood Count: CBC Latest Ref Rng & Units 07/17/2021 07/13/2021 04/13/2021  WBC 4.0 - 10.5 K/uL 12.7(H) 10.8(H) 15.3(H)  Hemoglobin 13.0 - 17.0 g/dL 04/15/2021 27.2 53.6  Hematocrit 39.0 - 52.0 % 46.1 49.5 45.9  Platelets 150 - 400 K/uL 288 273 290    Urinalysis:    Component Value Date/Time   COLORURINE STRAW (A) 12/24/2018 1300   APPEARANCEUR CLEAR 12/24/2018 1300   LABSPEC 1.006 12/24/2018 1300   PHURINE 8.0 12/24/2018 1300   GLUCOSEU NEGATIVE 12/24/2018 1300   HGBUR NEGATIVE 12/24/2018 1300   BILIRUBINUR NEGATIVE 12/24/2018 1300   KETONESUR NEGATIVE 12/24/2018 1300   PROTEINUR NEGATIVE 12/24/2018 1300   NITRITE NEGATIVE 12/24/2018 1300   LEUKOCYTESUR NEGATIVE 12/24/2018 1300   Drugs of Abuse:     Component Value Date/Time   LABOPIA NONE DETECTED 07/13/2021 1220   COCAINSCRNUR NONE DETECTED 07/13/2021 1220   LABBENZ POSITIVE (A) 07/13/2021 1220   AMPHETMU NONE DETECTED 07/13/2021 1220   THCU NONE DETECTED 07/13/2021 1220   LABBARB NONE DETECTED 07/13/2021 1220     Alcohol Level:    Component Value Date/Time   ETH <10 07/13/2021 1138    Lipid Panel:     Component Value Date/Time   CHOL 192 07/17/2021 1832   TRIG 82 07/17/2021 1832  HDL 48 07/17/2021 1832    CHOLHDL 4.0 07/17/2021 1832   VLDL 16 07/17/2021 1832   LDLCALC 128 (H) 07/17/2021 1832     Assessment/Plan:  Treatment Plan Summary:  Principal Problem:   MDD (major depressive disorder), recurrent episode, severe (HCC) Active Problems:   Suicidal ideation   Alcohol abuse R/o alcohol induced depressive d/o  Daily contact with patient to assess and evaluate symptoms and progress in treatment, medication management, and plan. Nunzio CoryMarcus Myers Oshima is a 40 y.o. male with a PPHx of 5y history of alcohol use disorder, MDD, and prior hospitalization for MDD w/SI and alcohol use disorder who presented via involuntary admission for SI without a plan .   MDD without psychotic features (r/o alcohol induced depressive d/o) - Continue Paxil 20mg  qd for depression - Continue Buspar 10mg  TID for anxiety - Taper off of Tegretol going down to 100 mg BID for 3 days then stop (AST 27 and ALT 32 on admission; WBC 10.8, H/H 16.1/49.5 and platelets 273 on admission) Will cancel Tegretol  level but will recheck CBC and LFTs this weekend while tapering off Tegretol - Vistaril 50mg  q6 hours PRN anxiety - Trazodone 50mg  qhs PRN insomnia - Start Seroquel 50mg  tonight for augmentation for depression and for paranoia  Alcohol use disorder  - CIWA protocol: 1mg  Ativan for CIWA >10, q12h with oral MVI and thiamine replacement (recent scores 0) - Gabapentin 300 mg TID for alcohol cravings/anxiety - Imodium 4mg  q6h PRN diarrhea - Zofran 4mg  q6h PRN nausea - [facility]  Admission labs: Lipid panel WNL except for LDL 128; TSH 1.063, A1c 5.6, WBC 12.7 H/H 15.1/46.1 and platelets 288; EKG shows NSR 82 bpm with QTC 432 ms  Leukocytosis - will need to see PCP after discharge for trending - appears chronically elevated when compared to previous labs (WBC 15.4 and 14.1 4 months ago, 15.3 3 months ago) -----  Filomena JunglingHannah Vrooman, MS3 [facility] 07/19/21

## 2021-07-19 NOTE — BHH Counselor (Signed)
CSW spoke with [facility] who states that the Pt was approved for admission and will need to contact them daily for an available bed.  [facility] state that they have no available beds today.

## 2021-07-19 NOTE — Progress Notes (Signed)
Psychoeducational Group Note  Date:  07/19/2021 Time: 2109  Group Topic/Focus:  Wrap-Up Group:   The focus of this group is to help patients review their daily goal of treatment and discuss progress on daily workbooks.  Participation Level: Did Not Attend  Participation Quality:  Not Applicable  Affect:  Not Applicable  Cognitive:  Not Applicable  Insight:  Not Applicable  Engagement in Group: Not Applicable  Additional Comments:  The patient did not attend group this evening.   Hazle Coca S 07/19/2021, 9:09 PM

## 2021-07-19 NOTE — Progress Notes (Addendum)
   07/19/21 2000  Psych Admission Type (Psych Patients Only)  Admission Status Involuntary  Psychosocial Assessment  Patient Complaints Depression  Eye Contact Brief  Facial Expression Flat;Sad  Affect Sad  Speech Logical/coherent;Soft  Interaction Isolative  Motor Activity Slow  Appearance/Hygiene Unremarkable  Behavior Characteristics Cooperative  Mood Depressed  Thought Process  Coherency WDL  Content WDL  Delusions None reported or observed  Perception WDL  Hallucination None reported or observed  Judgment Poor  Confusion None  Danger to Self  Current suicidal ideation? Denies  Danger to Others  Danger to Others None reported or observed   Pt seen in the bed. Pt isolative. Denies SI, HI, AVH and pain. Denies anxiety and rates depression 10/10. Not very talkative. Denies any other issues right now.

## 2021-07-20 DIAGNOSIS — F332 Major depressive disorder, recurrent severe without psychotic features: Secondary | ICD-10-CM | POA: Diagnosis not present

## 2021-07-20 MED ORDER — ONDANSETRON 4 MG PO TBDP: 4.0000 mg | ORAL_TABLET | Freq: Four times a day (QID) | ORAL | Status: AC | PRN

## 2021-07-20 MED ORDER — QUETIAPINE FUMARATE 100 MG PO TABS
100.0000 mg | ORAL_TABLET | Freq: Every day | ORAL | Status: DC
  Administered 2021-07-20: 100 mg via ORAL
  Filled 2021-07-20 (×3): qty 1

## 2021-07-20 NOTE — BHH Group Notes (Signed)
Type of Therapy and Topic: Group Therapy: Anger Management   Participation Level:  Did not attend   Description of Group: In this group, patients will learn helpful strategies and techniques to manage anger, express anger in alternative ways, change hostile attitudes, and prevent aggressive acts, such as verbal abuse and violence.This group will be process-oriented and eductional, with patients participating in exploration of their own experiences as well as giving and receiving support and challenge from other group members.  Therapeutic Goals: Patient will learn to manage anger. Patient will learn to stop violence or the threat of violence. Patient will learn to develop self control over thoughts and actions. Patient will receive support and feedback from others  Summary of Patient Progress: Did not attend  

## 2021-07-20 NOTE — Progress Notes (Signed)
Recreation Therapy Notes  Date: 8.19.22 Time: 0930 Location: [address] Hall Dayroom  Group Topic: Stress Management   Goal Area(s) Addresses:  Patient will actively participate in stress management techniques presented during session.  Patient will successfully identify benefit of practicing stress management post d/c.   Intervention: Relaxation exercise with ambient sound and script   Activity: Guided Imagery. LRT provided education, instruction, and demonstration on practice of visualization via guided imagery. Patient was asked to participate in the technique introduced during session. Patients were given suggestions of ways to access scripts post d/c and encouraged to explore Youtube and other apps available on smartphones, tablets, and computers.  Education:  Stress Management, Discharge Planning.   Education Outcome: Acknowledges education  Clinical Observations/Feedback: Patient did not attend group session.     Caroll Rancher, LRT/CTRS         Caroll Rancher A 07/20/2021 12:34 PM

## 2021-07-20 NOTE — Progress Notes (Signed)
   07/20/21 0716  Vital Signs  Pulse Rate 91  BP (!) 128/103  BP Location Right Arm  BP Method Automatic  Patient Position (if appropriate) Lying   D: Patient denies SI/HI/AVH. Patient rated anxiety  5/10 and  depression 10/10. Patient isolated in room A:  Patient took scheduled medicine.  Pt. Asked for and received many pieces of nicotine gum. Support and encouragement provided Routine safety checks conducted every 15 minutes. Patient  Informed to notify staff with any concerns.   R:  Safety maintained.

## 2021-07-20 NOTE — Tx Team (Signed)
Interdisciplinary Treatment and Diagnostic Plan Update  07/20/2021 Time of Session: 9:40am [name] MRN: [id number]  Principal Diagnosis: MDD (major depressive disorder), recurrent episode, severe (HCC)  Secondary Diagnoses: Principal Problem:   MDD (major depressive disorder), recurrent episode, severe (HCC) Active Problems:   Suicidal ideation   Alcohol abuse   Current Medications:  Current Facility-Administered Medications  Medication Dose Route Frequency Provider Last Rate Last Admin   acetaminophen (TYLENOL) tablet 650 mg  650 mg Oral Q6H PRN Antonieta Pert, MD       busPIRone (BUSPAR) tablet 10 mg  10 mg Oral TID Antonieta Pert, MD   10 mg at 07/20/21 0751   carbamazepine (TEGRETOL) tablet 100 mg  100 mg Oral BID Comer Locket, MD   100 mg at 07/20/21 0258   cetaphil lotion   Topical PRN Lauro Franklin, MD       gabapentin (NEURONTIN) capsule 300 mg  300 mg Oral TID Antonieta Pert, MD   300 mg at 07/20/21 0750   hydrOXYzine (ATARAX/VISTARIL) tablet 50 mg  50 mg Oral Q6H PRN Antonieta Pert, MD   50 mg at 07/20/21 0755   loperamide (IMODIUM) capsule 4 mg  4 mg Oral Q6H PRN Antonieta Pert, MD       LORazepam (ATIVAN) tablet 1 mg  1 mg Oral Q12H PRN Antonieta Pert, MD       multivitamin with minerals tablet 1 tablet  1 tablet Oral q morning Antonieta Pert, MD   1 tablet at 07/20/21 0751   nicotine polacrilex (NICORETTE) gum 2 mg  2 mg Oral PRN Antonieta Pert, MD   2 mg at 07/20/21 0940   ondansetron (ZOFRAN-ODT) disintegrating tablet 4 mg  4 mg Oral Q6H PRN Comer Locket, MD       PARoxetine (PAXIL) tablet 20 mg  20 mg Oral q morning Antonieta Pert, MD   20 mg at 07/20/21 0751   QUEtiapine (SEROQUEL) tablet 50 mg  50 mg Oral QHS Bartholomew Crews E, MD   50 mg at 07/19/21 2115   thiamine tablet 100 mg  100 mg Oral Daily Mason Jim, Amy E, MD   100 mg at 07/20/21 0750   traZODone (DESYREL) tablet 50 mg  50 mg Oral QHS PRN Antonieta Pert, MD   50 mg at 07/18/21 2125   PTA Medications: Medications Prior to Admission  Medication Sig Dispense Refill Last Dose   busPIRone (BUSPAR) 10 MG tablet Take 10 mg by mouth 3 (three) times daily. 8am, 12pm, 9pm      carbamazepine (TEGRETOL) 200 MG tablet Take 100-200 mg by mouth See admin instructions. Start date 07/05/2021: take 1 tablet (200 mg) by mouth twice daily for four days, then take 1/2 tablet (100 mg) twice daily for 3 days, then take 1/2 tablet (100 mg) daily for 3 days      hydrOXYzine (ATARAX/VISTARIL) 25 MG tablet Take 1 tablet (25 mg total) by mouth 3 (three) times daily as needed for anxiety. (Patient not taking: No sig reported) 30 tablet 0    Multiple Vitamin (MULTIVITAMIN WITH MINERALS) TABS tablet Take 1 tablet by mouth every morning.      Omega-3 Fatty Acids (FISH OIL) 1000 MG CAPS Take 3,000 mg by mouth every morning.      PARoxetine (PAXIL) 20 MG tablet Take 20 mg by mouth every morning.      thiamine 100 MG tablet Take 100 mg by mouth every  morning.      traZODone (DESYREL) 50 MG tablet Take 50 mg by mouth at bedtime as needed for sleep.       Patient Stressors: [employer] fast streamer difficulties Medication change or noncompliance Occupational concerns Substance abuse  Patient Strengths: Ability for insight Capable of independent living Communication skills Physical Health Religious Affiliation Supportive family/friends  Treatment Modalities: Medication Management, Group therapy, Case management,  1 to 1 session with clinician, Psychoeducation, Recreational therapy.   Physician Treatment Plan for Primary Diagnosis: MDD (major depressive disorder), recurrent episode, severe (HCC) Long Term Goal(s): Improvement in symptoms so as ready for discharge   Short Term Goals: Ability to identify changes in lifestyle to reduce recurrence of condition will improve Ability to verbalize feelings will improve Ability to disclose and discuss suicidal  ideas Ability to demonstrate self-control will improve Ability to identify and develop effective coping behaviors will improve Compliance with prescribed medications will improve Ability to identify triggers associated with substance abuse/mental health issues will improve  Medication Management: Evaluate patient's response, side effects, and tolerance of medication regimen.  Therapeutic Interventions: 1 to 1 sessions, Unit Group sessions and Medication administration.  Evaluation of Outcomes: Progressing  Physician Treatment Plan for Secondary Diagnosis: Principal Problem:   MDD (major depressive disorder), recurrent episode, severe (HCC) Active Problems:   Suicidal ideation   Alcohol abuse  Long Term Goal(s): Improvement in symptoms so as ready for discharge   Short Term Goals: Ability to identify changes in lifestyle to reduce recurrence of condition will improve Ability to verbalize feelings will improve Ability to disclose and discuss suicidal ideas Ability to demonstrate self-control will improve Ability to identify and develop effective coping behaviors will improve Compliance with prescribed medications will improve Ability to identify triggers associated with substance abuse/mental health issues will improve     Medication Management: Evaluate patient's response, side effects, and tolerance of medication regimen.  Therapeutic Interventions: 1 to 1 sessions, Unit Group sessions and Medication administration.  Evaluation of Outcomes: Progressing   RN Treatment Plan for Primary Diagnosis: MDD (major depressive disorder), recurrent episode, severe (HCC) Long Term Goal(s): Knowledge of disease and therapeutic regimen to maintain health will improve  Short Term Goals: Ability to remain free from injury will improve, Ability to verbalize frustration and anger appropriately will improve, Ability to demonstrate self-control, Ability to identify and develop effective coping  behaviors will improve, and Compliance with prescribed medications will improve  Medication Management: RN will administer medications as ordered by provider, will assess and evaluate patient's response and provide education to patient for prescribed medication. RN will report any adverse and/or side effects to prescribing provider.  Therapeutic Interventions: 1 on 1 counseling sessions, Psychoeducation, Medication administration, Evaluate responses to treatment, Monitor vital signs and CBGs as ordered, Perform/monitor CIWA, COWS, AIMS and Fall Risk screenings as ordered, Perform wound care treatments as ordered.  Evaluation of Outcomes: Progressing   LCSW Treatment Plan for Primary Diagnosis: MDD (major depressive disorder), recurrent episode, severe (HCC) Long Term Goal(s): Safe transition to appropriate next level of care at discharge, Engage patient in therapeutic group addressing interpersonal concerns.  Short Term Goals: Engage patient in aftercare planning with referrals and resources, Increase social support, Increase ability to appropriately verbalize feelings, Facilitate acceptance of mental health diagnosis and concerns, Identify triggers associated with mental health/substance abuse issues, and Increase skills for wellness and recovery  Therapeutic Interventions: Assess for all discharge needs, 1 to 1 time with Social worker, Explore available resources and support systems, Assess for  adequacy in community support network, Educate family and significant other(s) on suicide prevention, Complete Psychosocial Assessment, Interpersonal group therapy.  Evaluation of Outcomes: Progressing   Progress in Treatment: Attending groups: No. Participating in groups: No. Taking medication as prescribed: Yes. Toleration medication: Yes. Family/Significant other contact made: Yes, individual(s) contacted:  girlfriend Patient understands diagnosis: Yes. Discussing patient identified  problems/goals with staff: Yes. Medical problems stabilized or resolved: Yes. Denies suicidal/homicidal ideation: Yes. Issues/concerns per patient self-inventory: No.   New problem(s) identified: No, Describe:  none  New Short Term/Long Term Goal(s): detox, medication management for mood stabilization; elimination of SI thoughts; development of comprehensive mental wellness/sobriety plan   Patient Goals:  "To get stable"  Discharge Plan or Barriers: Pt has been accepted to [facility] for Monday, August 22nd at [facility] for residential substance use treatment.     Reason for Continuation of Hospitalization: Anxiety Depression Medication stabilization  Estimated Length of Stay: 3-5 days  Attendees: Patient: Carlos Flowers 07/16/2021   Physician: Arna Snipe, DO 07/16/2021   Nursing:  07/16/2021   RN Care Manager: 07/16/2021   Social Worker: Ruthann Cancer, LCSW 07/16/2021  Recreational Therapist:  07/16/2021   Other:  07/16/2021   Other:  07/16/2021   Other: 07/16/2021     Scribe for Treatment Team: Felizardo Hoffmann, LCSWA 07/20/2021 11:06 AM

## 2021-07-20 NOTE — Progress Notes (Addendum)
[facility] MD Progress Note  07/20/2021 2:37 PM [name]  MRN:  [id number] Subjective:   [name] is a 40 yr old male who presents under IVC for SI and worsening depression. PPHx is significant for MDD, EtOH Abuse, Meth Abuse and prior hospitalization [facility] 03/2021 for similar symptoms.  He reports that he is doing okay today.  He reports that his sleep was off and on last night.  He reports that his appetite has been fine.  He reports no SI, HI, or AVH but does reports some residual paranoia which he describes as sense of unease and anxiety around peers on the unit. He denies ideas of reference, delusions, or first rank symptoms. He reports that his mood is doing all right today.  Discussed that we would increase the Seroquel tonight to help with both his sleep and anxiety.  Encouraged him to attend group.  When asked if he went to any groups yesterday he reported that he went to 1.  Discussed with him that the plan was still to get him to [facility] on Monday.  Discussed the importance of completing the residential rehab program and working with them on options for future sober living placement or [facility] placement after he completes their program. He has no other concerns at present.  Principal Problem: MDD (major depressive disorder), recurrent episode, severe (HCC) Diagnosis: Principal Problem:   MDD (major depressive disorder), recurrent episode, severe (HCC) Active Problems:   Suicidal ideation   Alcohol abuse  Total Time spent with patient: 30 minutes  Past Psychiatric History: MDD, EtOH Abuse, Meth Abuse and prior hospitalization [facility] 03/2021 for similar symptoms.  Past Medical History:  Past Medical History:  Diagnosis Date   Alcoholism (HCC)    Anxiety    Depression    Substance abuse (HCC)    History reviewed. No pertinent surgical history. Family History:  Family History  Problem Relation Age of Onset   Cancer Maternal Grandmother    Heart disease Paternal Grandmother     Heart disease Paternal Grandfather    Family Psychiatric  History: Mothers side of family- Depression Social History:  Social History   Substance and Sexual Activity  Alcohol Use Yes   Alcohol/week: 168.0 standard drinks   Types: 168 Cans of beer per week     Social History   Substance and Sexual Activity  Drug Use Not Currently   Types: Cocaine, Heroin, Methamphetamines   Comment: Last used methamphetamines "several months ago"    Social History   Socioeconomic History   Marital status: Single    Spouse name: Not on file   Number of children: Not on file   Years of education: Not on file   Highest education level: Not on file  Occupational History   Not on file  Tobacco Use   Smoking status: Every Day    Packs/day: 1.00    Years: 17.00    Pack years: 17.00    Types: Cigarettes   Smokeless tobacco: Never  Vaping Use   Vaping Use: Never used  Substance and Sexual Activity   Alcohol use: Yes    Alcohol/week: 168.0 standard drinks    Types: 168 Cans of beer per week   Drug use: Not Currently    Types: Cocaine, Heroin, Methamphetamines    Comment: Last used methamphetamines "several months ago"   Sexual activity: Not Currently  Other Topics Concern   Not on file  Social History Narrative   Not on file   Social  Determinants of Health   Financial Resource Strain: Not on file  Food Insecurity: Not on file  Transportation Needs: Not on file  Physical Activity: Not on file  Stress: Not on file  Social Connections: Not on file   Sleep: Fair  Appetite:  Good  Current Medications: Current Facility-Administered Medications  Medication Dose Route Frequency Provider Last Rate Last Admin   acetaminophen (TYLENOL) tablet 650 mg  650 mg Oral Q6H PRN Antonieta Pert, MD       busPIRone (BUSPAR) tablet 10 mg  10 mg Oral TID Antonieta Pert, MD   10 mg at 07/20/21 1206   carbamazepine (TEGRETOL) tablet 100 mg  100 mg Oral BID Comer Locket, MD   100 mg at  07/20/21 0300   cetaphil lotion   Topical PRN Lauro Franklin, MD       gabapentin (NEURONTIN) capsule 300 mg  300 mg Oral TID Antonieta Pert, MD   300 mg at 07/20/21 1209   hydrOXYzine (ATARAX/VISTARIL) tablet 50 mg  50 mg Oral Q6H PRN Antonieta Pert, MD   50 mg at 07/20/21 0755   loperamide (IMODIUM) capsule 4 mg  4 mg Oral Q6H PRN Antonieta Pert, MD       LORazepam (ATIVAN) tablet 1 mg  1 mg Oral Q12H PRN Antonieta Pert, MD       multivitamin with minerals tablet 1 tablet  1 tablet Oral q morning Antonieta Pert, MD   1 tablet at 07/20/21 0751   nicotine polacrilex (NICORETTE) gum 2 mg  2 mg Oral PRN Antonieta Pert, MD   2 mg at 07/20/21 1209   ondansetron (ZOFRAN-ODT) disintegrating tablet 4 mg  4 mg Oral Q6H PRN Comer Locket, MD       PARoxetine (PAXIL) tablet 20 mg  20 mg Oral q morning Antonieta Pert, MD   20 mg at 07/20/21 0751   QUEtiapine (SEROQUEL) tablet 100 mg  100 mg Oral QHS Lauro Franklin, MD       thiamine tablet 100 mg  100 mg Oral Daily Mason Jim, Percy Winterrowd E, MD   100 mg at 07/20/21 0750   traZODone (DESYREL) tablet 50 mg  50 mg Oral QHS PRN Antonieta Pert, MD   50 mg at 07/18/21 2125    Lab Results: No results found for this or any previous visit (from the past 48 hour(s)).  Blood Alcohol level:  Lab Results  Component Value Date   ETH <10 07/13/2021   ETH <10 04/13/2021    Metabolic Disorder Labs: Lab Results  Component Value Date   HGBA1C 5.6 07/17/2021   MPG 114.02 07/17/2021   MPG 116.89 03/24/2021   No results found for: PROLACTIN Lab Results  Component Value Date   CHOL 192 07/17/2021   TRIG 82 07/17/2021   HDL 48 07/17/2021   CHOLHDL 4.0 07/17/2021   VLDL 16 07/17/2021   LDLCALC 128 (H) 07/17/2021   LDLCALC 91 03/24/2021    Physical Findings:  Musculoskeletal: Strength & Muscle Tone: within normal limits Gait & Station: normal Patient leans: N/A  Psychiatric Specialty Exam:  Presentation   General Appearance: Appropriate for Environment; Casual; Fairly Groomed  Eye Contact:Good  Speech:Clear and Coherent; Normal Rate  Speech Volume:Normal  Handedness:Right   Mood and Affect  Mood:anxious and dysphoric  Affect:Congruent   Thought Process  Thought Processes:Coherent; Goal Directed  Descriptions of Associations:Intact  Orientation:Full (Time, Place and Person)  Thought Content:Denies SI, HI,  AVH, ideas of reference, or first rank symptoms; endorses some paranoia/anxiety around peers but is not grossly responding to internal/external stimuli on exam  History of Schizophrenia/Schizoaffective disorder:No  Duration of Psychotic Symptoms:No data recorded Hallucinations:Hallucinations: None Ideas of Reference:None  Suicidal Thoughts:Suicidal Thoughts: No Homicidal Thoughts:Homicidal Thoughts: No  Sensorium  Memory:Immediate Fair; Recent Fair  Judgment:Fair  Insight:Fair   Executive Functions  Concentration:Fair  Attention Span:Fair  Recall:Fair  Fund of Knowledge:Fair  Language:Good   Psychomotor Activity  Psychomotor Activity: Psychomotor Activity: Normal  Assets  Assets:Desire for Improvement; Resilience   Sleep  Sleep: Sleep: Good Number of Hours of Sleep: 6.5   Physical Exam: Physical Exam Vitals and nursing note reviewed.  Constitutional:      General: He is not in acute distress.    Appearance: Normal appearance. He is normal weight. He is not ill-appearing or toxic-appearing.  HENT:     Head: Normocephalic and atraumatic.  Pulmonary:     Effort: Pulmonary effort is normal.  Musculoskeletal:        General: Normal range of motion.  Neurological:     General: No focal deficit present.     Mental Status: He is alert.   Review of Systems  Respiratory:  Negative for shortness of breath.   Cardiovascular:  Negative for chest pain.  Gastrointestinal:  Negative for constipation, diarrhea, nausea and vomiting.   Psychiatric/Behavioral:  Positive for depression.   Blood pressure (!) 128/103, pulse 91, temperature 98.7 F (37.1 C), temperature source Oral, resp. rate 16, height 6' (1.829 m), weight 43.5 kg, SpO2 97 %. Body mass index is 13.02 kg/m.   Treatment Plan Summary: Daily contact with patient to assess and evaluate symptoms and progress in treatment   Carlos Flowers is a 40 yr old male who presents under IVC for SI and worsening depression. PPHx is significant for MDD, EtOH Abuse, Meth Abuse and prior hospitalization [facility] 03/2021 for similar symptoms.   Patient does appear to have some benefit from starting the Seroquel as his mood has improved.  However, his sleep is still suboptimal and he reports residual anxiety/paranoia around peers so will increase Seroquel.  If he does not start attending more group therapies or getting out of his room we will place a lockout order.  We will continue to taper him off Tegretol. We will continue to monitor.   MDD without psychotic features (r/o alcohol induced depressive d/o) -Continue Paxil 20mg  daily -Continue Buspar 10mg  TID  -Increase Seroquel to 100 mg QHS -Continue taper off Tegretol (down to 100mg  bid for 6 doses then stop) - checking CBC and LFTs tomorrow for monitoring with recent use of Tegretol - Continue Neurontin 300mg  TID for anxiety/cravings/withdrawal   EtOH Abuse/Withdrawal: -Discontinue CIWA based on negative scores -Continue Thiamine 100 mg daily -Continue Gabapentin 300 mg TID for anxiety/cravings/withdrawal -Continue Imodium 4 mg q6 PRN  -Continue Zofran 4 mg q6 PRN -Continue Multivitamin daily - Anticipate d/c to [facility] on Monday  -Continue PRN's: Tylenol, Maalox, Atarax, Milk of Magnesia, Trazodone   , MD 07/20/2021, 2:37 PM

## 2021-07-21 DIAGNOSIS — F332 Major depressive disorder, recurrent severe without psychotic features: Secondary | ICD-10-CM | POA: Diagnosis not present

## 2021-07-21 LAB — CBC WITH DIFFERENTIAL/PLATELET
Abs Immature Granulocytes: 0.04 10*3/uL (ref 0.00–0.07)
Basophils Absolute: 0.1 10*3/uL (ref 0.0–0.1)
Basophils Relative: 1 %
Eosinophils Absolute: 0.2 10*3/uL (ref 0.0–0.5)
Eosinophils Relative: 2 %
HCT: 48.9 % (ref 39.0–52.0)
Hemoglobin: 15.8 g/dL (ref 13.0–17.0)
Immature Granulocytes: 1 %
Lymphocytes Relative: 26 %
Lymphs Abs: 2.2 10*3/uL (ref 0.7–4.0)
MCH: 29.5 pg (ref 26.0–34.0)
MCHC: 32.3 g/dL (ref 30.0–36.0)
MCV: 91.2 fL (ref 80.0–100.0)
Monocytes Absolute: 0.7 10*3/uL (ref 0.1–1.0)
Monocytes Relative: 8 %
Neutro Abs: 5.1 10*3/uL (ref 1.7–7.7)
Neutrophils Relative %: 62 %
Platelets: 268 10*3/uL (ref 150–400)
RBC: 5.36 MIL/uL (ref 4.22–5.81)
RDW: 13.4 % (ref 11.5–15.5)
WBC: 8.2 10*3/uL (ref 4.0–10.5)
nRBC: 0 % (ref 0.0–0.2)

## 2021-07-21 LAB — HEPATIC FUNCTION PANEL
ALT: 34 U/L (ref 0–44)
AST: 23 U/L (ref 15–41)
Albumin: 4.6 g/dL (ref 3.5–5.0)
Alkaline Phosphatase: 91 U/L (ref 38–126)
Bilirubin, Direct: <0.1 mg/dL (ref 0.0–0.2)
Total Bilirubin: 0.5 mg/dL (ref 0.3–1.2)
Total Protein: 7.9 g/dL (ref 6.5–8.1)

## 2021-07-21 MED ORDER — QUETIAPINE FUMARATE 100 MG PO TABS
100.0000 mg | ORAL_TABLET | Freq: Every day | ORAL | Status: DC
  Administered 2021-07-21 – 2021-07-25 (×5): 100 mg via ORAL
  Filled 2021-07-21 (×3): qty 1
  Filled 2021-07-21 (×5): qty 14

## 2021-07-21 MED ORDER — QUETIAPINE FUMARATE 200 MG PO TABS
200.0000 mg | ORAL_TABLET | Freq: Every day | ORAL | Status: DC
  Filled 2021-07-21: qty 1

## 2021-07-21 NOTE — Progress Notes (Signed)
Pt presents with anxiety (5/10) and depression (10/10).  Pt appears sad and isolates.  Patient denies SI/HI and AVH.  Pt verbally contracts for safety.  Patient remains safe on unit with Q 15 minute safety checks.     07/20/21 2140  Psych Admission Type (Psych Patients Only)  Admission Status Involuntary  Psychosocial Assessment  Patient Complaints Anxiety;Depression  Eye Contact Brief  Facial Expression Flat;Sad  Affect Sad  Speech Logical/coherent;Soft  Interaction Isolative  Motor Activity Slow  Appearance/Hygiene Unremarkable  Behavior Characteristics Cooperative  Mood Depressed;Anxious  Thought Process  Coherency WDL  Content WDL  Delusions None reported or observed  Perception WDL  Hallucination None reported or observed  Judgment Poor  Confusion None  Danger to Self  Current suicidal ideation? Denies  Self-Injurious Behavior No self-injurious ideation or behavior indicators observed or expressed   Agreement Not to Harm Self Yes  Description of Agreement Verbal agreement to not harm self on the unit  Danger to Others  Danger to Others None reported or observed

## 2021-07-21 NOTE — [facility] Group Notes (Signed)
[facility] Group Notes:  (Nursing/MHT/Case Management/Adjunct)  Date:  07/20/2021 Time:  8:00 PM  Type of Therapy:  Group Therapy  Participation Level:  Did Not Attend  Participation Quality:  NA  Affect:  NA  Cognitive:  NA  Insight:  None  Engagement in Group:  NA  Modes of Intervention:  NA  Summary of Progress/Problems:  Carlos Flowers [name] 07/20/2021, 8:00 PM 

## 2021-07-21 NOTE — Progress Notes (Addendum)
[facility] MD Progress Note  07/21/2021 3:49 PM Carlos Flowers  MRN:  [id number] Subjective:  Carlos Flowers is a 40 yr old male who presents under IVC for SI and worsening depression. PPHx is significant for MDD, EtOH Abuse, Meth Abuse and prior hospitalization [facility] 03/2021 for similar symptoms.  Patient reports he did not sleep well last night. On assessment this AM patient reports he does not like going to group or spending time in the dayroom because he feels it is too crowded. Patient reports "Y'all just don't have enough space here." Patient reports that he is not sure if there is a point to go to rehab right now. Patient reports he has been to rehab before and he does not find group therapy useful. Patient reports that he has found individual therapy somewhat helpful to him in the past. Patient reports "I wish I could just be normal and have a job." Patient reports he is not sure what to do. Patient denies SI but also endorses that he feels everything is useless. Patient also endorses that he feels anxious on the unit. Patient denies HI and AVH. Patient reports he is eating well.   Principal Problem: MDD (major depressive disorder), recurrent episode, severe (HCC) Diagnosis: Principal Problem:   MDD (major depressive disorder), recurrent episode, severe (HCC) Active Problems:   Suicidal ideation   Alcohol abuse  Total Time spent with patient: 15 minutes  Past Psychiatric History: See H&P  Past Medical History:  Past Medical History:  Diagnosis Date   Alcoholism (HCC)    Anxiety    Depression    Substance abuse (HCC)    History reviewed. No pertinent surgical history. Family History:  Family History  Problem Relation Age of Onset   Cancer Maternal Grandmother    Heart disease Paternal Grandmother    Heart disease Paternal Grandfather    Family Psychiatric  History: See H&P Social History:  Social History   Substance and Sexual Activity  Alcohol Use Yes   Alcohol/week: 168.0  standard drinks   Types: 168 Cans of beer per week     Social History   Substance and Sexual Activity  Drug Use Not Currently   Types: Cocaine, Heroin, Methamphetamines   Comment: Last used methamphetamines "several months ago"    Social History   Socioeconomic History   Marital status: Single    Spouse name: Not on file   Number of children: Not on file   Years of education: Not on file   Highest education level: Not on file  Occupational History   Not on file  Tobacco Use   Smoking status: Every Day    Packs/day: 1.00    Years: 17.00    Pack years: 17.00    Types: Cigarettes   Smokeless tobacco: Never  Vaping Use   Vaping Use: Never used  Substance and Sexual Activity   Alcohol use: Yes    Alcohol/week: 168.0 standard drinks    Types: 168 Cans of beer per week   Drug use: Not Currently    Types: Cocaine, Heroin, Methamphetamines    Comment: Last used methamphetamines "several months ago"   Sexual activity: Not Currently  Other Topics Concern   Not on file  Social History Narrative   Not on file   Social Determinants of Health   Financial Resource Strain: Not on file  Food Insecurity: Not on file  Transportation Needs: Not on file  Physical Activity: Not on file  Stress: Not on file  Social  Connections: Not on file   Additional Social History:                         Sleep: Poor  Appetite:  Good  Current Medications: Current Facility-Administered Medications  Medication Dose Route Frequency Provider Last Rate Last Admin   acetaminophen (TYLENOL) tablet 650 mg  650 mg Oral Q6H PRN Antonieta Pert, MD       busPIRone (BUSPAR) tablet 10 mg  10 mg Oral TID Antonieta Pert, MD   10 mg at 07/21/21 1133   carbamazepine (TEGRETOL) tablet 100 mg  100 mg Oral BID Comer Locket, MD   100 mg at 07/21/21 4431   cetaphil lotion   Topical PRN Lauro Franklin, MD       gabapentin (NEURONTIN) capsule 300 mg  300 mg Oral TID Antonieta Pert,  MD   300 mg at 07/21/21 1133   hydrOXYzine (ATARAX/VISTARIL) tablet 50 mg  50 mg Oral Q6H PRN Antonieta Pert, MD   50 mg at 07/21/21 1133   loperamide (IMODIUM) capsule 4 mg  4 mg Oral Q6H PRN Antonieta Pert, MD       multivitamin with minerals tablet 1 tablet  1 tablet Oral q morning Antonieta Pert, MD   1 tablet at 07/21/21 1134   nicotine polacrilex (NICORETTE) gum 2 mg  2 mg Oral PRN Antonieta Pert, MD   2 mg at 07/21/21 1441   ondansetron (ZOFRAN-ODT) disintegrating tablet 4 mg  4 mg Oral Q6H PRN Comer Locket, MD       PARoxetine (PAXIL) tablet 20 mg  20 mg Oral q morning Antonieta Pert, MD   20 mg at 07/21/21 0815   QUEtiapine (SEROQUEL) tablet 200 mg  200 mg Oral QHS Eliseo Gum B, MD       thiamine tablet 100 mg  100 mg Oral Daily Mason Jim, Daleysa Kristiansen E, MD   100 mg at 07/21/21 0815   traZODone (DESYREL) tablet 50 mg  50 mg Oral QHS PRN Antonieta Pert, MD   50 mg at 07/18/21 2125    Lab Results:  Results for orders placed or performed during the hospital encounter of 07/15/21 (from the past 48 hour(s))  CBC with Differential/Platelet     Status: None   Collection Time: 07/21/21  7:06 AM  Result Value Ref Range   WBC 8.2 4.0 - 10.5 K/uL   RBC 5.36 4.22 - 5.81 MIL/uL   Hemoglobin 15.8 13.0 - 17.0 g/dL   HCT 54.0 08.6 - 76.1 %   MCV 91.2 80.0 - 100.0 fL   MCH 29.5 26.0 - 34.0 pg   MCHC 32.3 30.0 - 36.0 g/dL   RDW 95.0 93.2 - 67.1 %   Platelets 268 150 - 400 K/uL   nRBC 0.0 0.0 - 0.2 %   Neutrophils Relative % 62 %   Neutro Abs 5.1 1.7 - 7.7 K/uL   Lymphocytes Relative 26 %   Lymphs Abs 2.2 0.7 - 4.0 K/uL   Monocytes Relative 8 %   Monocytes Absolute 0.7 0.1 - 1.0 K/uL   Eosinophils Relative 2 %   Eosinophils Absolute 0.2 0.0 - 0.5 K/uL   Basophils Relative 1 %   Basophils Absolute 0.1 0.0 - 0.1 K/uL   Immature Granulocytes 1 %   Abs Immature Granulocytes 0.04 0.00 - 0.07 K/uL    Comment: Performed at [facility], [address] [place], [place] [address]  Hepatic function panel     Status: None   Collection Time: 07/21/21  7:06 AM  Result Value Ref Range   Total Protein 7.9 6.5 - 8.1 g/dL   Albumin 4.6 3.5 - 5.0 g/dL   AST 23 15 - 41 U/L   ALT 34 0 - 44 U/L   Alkaline Phosphatase 91 38 - 126 U/L   Total Bilirubin 0.5 0.3 - 1.2 mg/dL   Bilirubin, Direct <6.0<0.1 0.0 - 0.2 mg/dL   Indirect Bilirubin NOT CALCULATED 0.3 - 0.9 mg/dL    Comment: Performed at [facility], [address] [place], [place] [address]    Blood Alcohol level:  Lab Results  Component Value Date   Bjosc LLCETH <10 07/13/2021   ETH <10 04/13/2021    Metabolic Disorder Labs: Lab Results  Component Value Date   HGBA1C 5.6 07/17/2021   MPG 114.02 07/17/2021   MPG 116.89 03/24/2021   No results found for: PROLACTIN Lab Results  Component Value Date   CHOL 192 07/17/2021   TRIG 82 07/17/2021   HDL 48 07/17/2021   CHOLHDL 4.0 07/17/2021   VLDL 16 07/17/2021   LDLCALC 128 (H) 07/17/2021   LDLCALC 91 03/24/2021    Physical Findings: AIMS: Facial and Oral Movements Muscles of Facial Expression: None, normal Lips and Perioral Area: None, normal Jaw: None, normal Tongue: None, normal,Extremity Movements Upper (arms, wrists, hands, fingers): None, normal Lower (legs, knees, ankles, toes): None, normal, Trunk Movements Neck, shoulders, hips: None, normal, Overall Severity Severity of abnormal movements (highest score from questions above): None, normal Incapacitation due to abnormal movements: None, normal Patient's awareness of abnormal movements (rate only patient's report): No Awareness, Dental Status Current problems with teeth and/or dentures?: No Does patient usually wear dentures?: No  CIWA:  CIWA-Ar Total: 0 COWS:     Musculoskeletal: Strength & Muscle Tone: within normal limits Gait & Station: normal Patient leans: N/A  Psychiatric Specialty Exam:  Presentation  General Appearance: Appropriate for  Environment  Eye Contact:Fair  Speech:Clear and Coherent  Speech Volume:Decreased  Handedness:Right   Mood and Affect  Mood:Dysphoric  Affect:Congruent   Thought Process  Thought Processes:Coherent  Descriptions of Associations:Intact  Orientation:Full (Time, Place and Person)  Thought Content:Logical  History of Schizophrenia/Schizoaffective disorder:No  Duration of Psychotic Symptoms:No data recorded Hallucinations:Hallucinations: None  Ideas of Reference:None  Suicidal Thoughts:Suicidal Thoughts: No  Homicidal Thoughts:Homicidal Thoughts: No   Sensorium  Memory:Immediate Good; Recent Good; Remote Good  Judgment:Impaired  Insight:None   Executive Functions  Concentration:Fair  Attention Span:Fair  Recall:Good  Fund of Knowledge:Good  Language:Good   Psychomotor Activity  Psychomotor Activity:Psychomotor Activity: Psychomotor Retardation   Assets  Assets:Resilience   Sleep  Sleep:Sleep: Poor Number of Hours of Sleep: 6.5    Physical Exam: Physical Exam Constitutional:      Appearance: Normal appearance.  HENT:     Head: Normocephalic and atraumatic.  Pulmonary:     Effort: Pulmonary effort is normal.  Neurological:     General: No focal deficit present.     Mental Status: He is alert and oriented to person, place, and time.   Review of Systems  Constitutional:  Negative for chills and fever.  HENT:  Negative for hearing loss.   Eyes:  Negative for blurred vision.  Respiratory:  Negative for cough and wheezing.   Cardiovascular:  Negative for chest pain.  Gastrointestinal:  Negative for abdominal pain.  Neurological:  Negative for dizziness.  Psychiatric/Behavioral:  Positive  for depression.   Blood pressure (!) 123/95, pulse 92, temperature 98.7 F (37.1 C), temperature source Oral, resp. rate 14, height 6' (1.829 m), weight 43.5 kg, SpO2 97 %. Body mass index is 13.02 kg/m.   Treatment Plan Summary: Daily contact  with patient to assess and evaluate symptoms and progress in treatment MDD without psychotic features (r/o alcohol induced depressive d/o) Patient continues to display poor insight as he was not able to come up with a plan that could help him decrease his chance of repeating his usual cycle of EtOH use. Patient did not appear to understand that he would have to make some personal changes and put effort into changing his behavior to help with the process. Patient is not future oriented and overall appears anhedonic and pessimistic.   -Continue Paxil 20mg  daily -Continue Buspar 10mg  TID  -Continue Seroquel 100 mg QHS -Continue taper off Tegretol (down to 100mg  bid for a total 6 doses then stop, 8/21) - Hepatic function panel WNL today and CBC WNL today - Continue Neurontin 300mg  TID for anxiety/cravings/withdrawal    EtOH Abuse/Withdrawal: -Discontinue CIWA based on negative scores -Continue Thiamine 100 mg daily -Continue Gabapentin 300 mg TID for anxiety/cravings/withdrawal -Continue Imodium 4 mg q6 PRN  -Continue Zofran 4 mg q6 PRN -Continue Multivitamin daily - Anticipate d/c to [facility] on Monday   -Continue PRN's: Tylenol, Maalox, Atarax, Milk of Magnesia, Trazodone   9/21, MD 07/21/2021, 3:49 PM  I have independently evaluated the patient during a face-to-face assessment on 07/21/21. I reviewed the patient's chart, and I participated in key portions of the service. I discussed the case with the Monday, and I agree with the assessment and plan of care as documented in the House Officer's note.   On my assessment patient reports improved paranoia and anxiety with dose titration up on Seroquel. He is reluctant to make additional medication changes at this time so will hold on further dose increase on Seroquel as he is titrated off Tegretol. He denies SI, HI, or AVH. He continues to endorse plans to go to [facility] Monday but remains pessimistic about rehab in  general when discussed today. 07/23/21, MD, [employer]

## 2021-07-21 NOTE — Plan of Care (Signed)
  Problem: Education: Goal: Emotional status will improve Outcome: Progressing   Problem: Coping: Goal: Ability to demonstrate self-control will improve Outcome: Progressing   Problem: Activity: Goal: Interest or engagement in activities will improve Outcome: Not Progressing

## 2021-07-21 NOTE — Progress Notes (Signed)
   07/21/21 0908  Charting Type  Charting Type Shift assessment  Assessment of needs addressed Yes  Orders Chart Check (once per shift) Completed  Safety Check Verification  Has the RN verified the 15 minute safety check completion? Yes  Neurological  Neuro (WDL) WDL  HEENT  HEENT (WDL) WDL  Respiratory  Respiratory (WDL) WDL  Cardiac  Cardiac (WDL) X (Elevated B/P)  Vascular  Vascular (WDL) X (Elevated B/P)  Braden Scale (Ages 8 and up)  Sensory Perceptions 4  Moisture 4  Activity 4  Mobility 4  Nutrition 3  Friction and Shear 3  Braden Scale Score 22  Musculoskeletal  Musculoskeletal (WDL) WDL  Assistive Device None  Gastrointestinal  Gastrointestinal (WDL) WDL  GU Assessment  Genitourinary (WDL) WDL  Neurological  Level of Consciousness Alert

## 2021-07-21 NOTE — Plan of Care (Signed)
  Problem: Education: Goal: Emotional status will improve Outcome: Not Progressing Goal: Mental status will improve Outcome: Not Progressing   Problem: Activity: Goal: Interest or engagement in activities will improve Outcome: Not Progressing   

## 2021-07-21 NOTE — Progress Notes (Signed)
Pt did not attend group. 

## 2021-07-21 NOTE — Progress Notes (Signed)
Psychoeducational Group Note  Date:  07/21/2021 Time:  2000  Group Topic/Focus:  Wrap up group  Participation Level: Did Not Attend  Participation Quality:  Not Applicable  Affect:  Not Applicable  Cognitive:  Not Applicable  Insight:  Not Applicable  Engagement in Group: Not Applicable  Additional Comments:  Did not attend.   Johann Capers S 07/21/2021, 9:22 PM

## 2021-07-21 NOTE — Plan of Care (Signed)
  Problem: Education: Goal: Emotional status will improve Outcome: Progressing Goal: Mental status will improve Outcome: Progressing   

## 2021-07-21 NOTE — Progress Notes (Signed)
     07/21/21 2133  Psych Admission Type (Psych Patients Only)  Admission Status Involuntary  Psychosocial Assessment  Patient Complaints Anxiety;Depression  Eye Contact Brief  Facial Expression Flat;Sad  Affect Sad  Speech Logical/coherent;Soft  Interaction Isolative  Motor Activity Slow  Appearance/Hygiene Unremarkable  Behavior Characteristics Cooperative  Mood Depressed;Anxious  Thought Process  Coherency WDL  Content WDL  Delusions None reported or observed  Perception WDL  Hallucination None reported or observed  Judgment Poor  Confusion None  Danger to Self  Current suicidal ideation? Denies  Self-Injurious Behavior No self-injurious ideation or behavior indicators observed or expressed   Agreement Not to Harm Self Yes  Description of Agreement Verbal agreement to not harm self on the unit  Danger to Others  Danger to Others None reported or observed

## 2021-07-22 DIAGNOSIS — F332 Major depressive disorder, recurrent severe without psychotic features: Secondary | ICD-10-CM | POA: Diagnosis not present

## 2021-07-22 MED ORDER — PAROXETINE HCL 10 MG PO TABS
10.0000 mg | ORAL_TABLET | Freq: Every morning | ORAL | Status: AC
  Administered 2021-07-23: 10 mg via ORAL
  Filled 2021-07-22: qty 1

## 2021-07-22 MED ORDER — FLUOXETINE HCL 10 MG PO CAPS
10.0000 mg | ORAL_CAPSULE | Freq: Once | ORAL | Status: AC
  Filled 2021-07-22: qty 1

## 2021-07-22 MED ORDER — FLUOXETINE HCL 20 MG PO CAPS
20.0000 mg | ORAL_CAPSULE | Freq: Every day | ORAL | Status: DC
  Administered 2021-07-23 – 2021-07-24 (×2): 20 mg via ORAL
  Filled 2021-07-22 (×4): qty 1

## 2021-07-22 MED ORDER — FLUOXETINE HCL 10 MG PO CAPS
ORAL_CAPSULE | ORAL | Status: AC
  Administered 2021-07-22: 10 mg via ORAL
  Filled 2021-07-22: qty 1

## 2021-07-22 NOTE — Progress Notes (Signed)
Psychoeducational Group Note  Date:  07/22/2021 Time:  2000  Group Topic/Focus:  Wrap up group  Participation Level: Did Not Attend  Participation Quality:  Not Applicable  Affect:  Not Applicable  Cognitive:  Not Applicable  Insight:  Not Applicable  Engagement in Group: Not Applicable  Additional Comments:  Did not attend.   Marcille Buffy 07/22/2021, 11:36 PM

## 2021-07-22 NOTE — Progress Notes (Addendum)
[facility] MD Progress Note  07/22/2021 2:19 PM Carlos Flowers  MRN:  [id number] Subjective:  [name] is a 40 yr old male who presents under IVC for SI and worsening depression. PPHx is significant for MDD, EtOH Abuse, Meth Abuse and [facility] for similar symptoms.   On assessment today patient continues to lay in his bed in the dark. Patient has not attended one group session. Patient reports that he is still having SI but without intent. Patient reports that "there is no point" to anything. Patient reports "I just have major, major depression." Patient reports that it is hard for him to think and reports "I don't know why I am like this, I just can't seem to do anything." Patient reports that he feels "about the same" as yesterday but he did sleep better overnight. Patient reports he continues to eat. Patient denies HI and AVH or paranoia.   Principal Problem: MDD (major depressive disorder), recurrent episode, severe (HCC) Diagnosis: Principal Problem:   MDD (major depressive disorder), recurrent episode, severe (HCC) Active Problems:   Suicidal ideation   Alcohol abuse  Total Time spent with patient: 15 minutes  Past Psychiatric History: Per H&P  Past Medical History:  Past Medical History:  Diagnosis Date   Alcoholism (HCC)    Anxiety    Depression    Substance abuse (HCC)    History reviewed. No pertinent surgical history. Family History:  Family History  Problem Relation Age of Onset   Cancer Maternal Grandmother    Heart disease Paternal Grandmother    Heart disease Paternal Grandfather    Family Psychiatric  History: Per H&P Social History:  Social History   Substance and Sexual Activity  Alcohol Use Yes   Alcohol/week: 168.0 standard drinks   Types: 168 Cans of beer per week     Social History   Substance and Sexual Activity  Drug Use Not Currently   Types: Cocaine, Heroin, Methamphetamines   Comment: Last used methamphetamines "several  months ago"    Social History   Socioeconomic History   Marital status: Single    Spouse name: Not on file   Number of children: Not on file   Years of education: Not on file   Highest education level: Not on file  Occupational History   Not on file  Tobacco Use   Smoking status: Every Day    Packs/day: 1.00    Years: 17.00    Pack years: 17.00    Types: Cigarettes   Smokeless tobacco: Never  Vaping Use   Vaping Use: Never used  Substance and Sexual Activity   Alcohol use: Yes    Alcohol/week: 168.0 standard drinks    Types: 168 Cans of beer per week   Drug use: Not Currently    Types: Cocaine, Heroin, Methamphetamines    Comment: Last used methamphetamines "several months ago"   Sexual activity: Not Currently  Other Topics Concern   Not on file  Social History Narrative   Not on file   Social Determinants of Health   Financial Resource Strain: Not on file  Food Insecurity: Not on file  Transportation Needs: Not on file  Physical Activity: Not on file  Stress: Not on file  Social Connections: Not on file   Sleep: Fair  Appetite:  Good  Current Medications: Current Facility-Administered Medications  Medication Dose Route Frequency Provider Last Rate Last Admin   acetaminophen (TYLENOL) tablet 650 mg  650 mg Oral Q6H PRN Landry Mellowlary, Greg  Hart [place], MD       busPIRone (BUSPAR) tablet 10 mg  10 mg Oral TID Antonieta Pert, MD   10 mg at 07/22/21 1205   cetaphil lotion   Topical PRN Lauro Franklin, MD       [START ON 07/23/2021] FLUoxetine (PROZAC) capsule 20 mg  20 mg Oral Daily Mason Jim, Hurley Sobel E, MD       gabapentin (NEURONTIN) capsule 300 mg  300 mg Oral TID Antonieta Pert, MD   300 mg at 07/22/21 1205   hydrOXYzine (ATARAX/VISTARIL) tablet 50 mg  50 mg Oral Q6H PRN Antonieta Pert, MD   50 mg at 07/22/21 0910   loperamide (IMODIUM) capsule 4 mg  4 mg Oral Q6H PRN Antonieta Pert, MD       multivitamin with minerals tablet 1 tablet  1 tablet Oral q  morning Antonieta Pert, MD   1 tablet at 07/22/21 2951   nicotine polacrilex (NICORETTE) gum 2 mg  2 mg Oral PRN Antonieta Pert, MD   2 mg at 07/22/21 1207   ondansetron (ZOFRAN-ODT) disintegrating tablet 4 mg  4 mg Oral Q6H PRN Comer Locket, MD       Melene Muller ON 07/23/2021] PARoxetine (PAXIL) tablet 10 mg  10 mg Oral q morning Mason Jim, Dyann Goodspeed E, MD       QUEtiapine (SEROQUEL) tablet 100 mg  100 mg Oral QHS Mason Jim, Keesha Pellum E, MD   100 mg at 07/21/21 2133   thiamine tablet 100 mg  100 mg Oral Daily Mason Jim, Joylene Wescott E, MD   100 mg at 07/22/21 0900   traZODone (DESYREL) tablet 50 mg  50 mg Oral QHS PRN Antonieta Pert, MD   50 mg at 07/18/21 2125    Lab Results:  Results for orders placed or performed during the hospital encounter of 07/15/21 (from the past 48 hour(s))  CBC with Differential/Platelet     Status: None   Collection Time: 07/21/21  7:06 AM  Result Value Ref Range   WBC 8.2 4.0 - 10.5 K/uL   RBC 5.36 4.22 - 5.81 MIL/uL   Hemoglobin 15.8 13.0 - 17.0 g/dL   HCT 88.4 16.6 - 06.3 %   MCV 91.2 80.0 - 100.0 fL   MCH 29.5 26.0 - 34.0 pg   MCHC 32.3 30.0 - 36.0 g/dL   RDW 01.6 01.0 - 93.2 %   Platelets 268 150 - 400 K/uL   nRBC 0.0 0.0 - 0.2 %   Neutrophils Relative % 62 %   Neutro Abs 5.1 1.7 - 7.7 K/uL   Lymphocytes Relative 26 %   Lymphs Abs 2.2 0.7 - 4.0 K/uL   Monocytes Relative 8 %   Monocytes Absolute 0.7 0.1 - 1.0 K/uL   Eosinophils Relative 2 %   Eosinophils Absolute 0.2 0.0 - 0.5 K/uL   Basophils Relative 1 %   Basophils Absolute 0.1 0.0 - 0.1 K/uL   Immature Granulocytes 1 %   Abs Immature Granulocytes 0.04 0.00 - 0.07 K/uL    Comment: Performed at [facility], [address] [place], [place] [address]  Hepatic function panel     Status: None   Collection Time: 07/21/21  7:06 AM  Result Value Ref Range   Total Protein 7.9 6.5 - 8.1 g/dL   Albumin 4.6 3.5 - 5.0 g/dL   AST 23 15 - 41 U/L   ALT 34 0 - 44 U/L   Alkaline Phosphatase 91  38 -  126 U/L   Total Bilirubin 0.5 0.3 - 1.2 mg/dL   Bilirubin, Direct <2.9 0.0 - 0.2 mg/dL   Indirect Bilirubin NOT CALCULATED 0.3 - 0.9 mg/dL    Comment: Performed at [facility], [address], [place], [place] [address]    Blood Alcohol level:  Lab Results  Component Value Date   [facility] <10 07/13/2021   ETH <10 04/13/2021    Metabolic Disorder Labs: Lab Results  Component Value Date   HGBA1C 5.6 07/17/2021   MPG 114.02 07/17/2021   MPG 116.89 03/24/2021   No results found for: PROLACTIN Lab Results  Component Value Date   CHOL 192 07/17/2021   TRIG 82 07/17/2021   HDL 48 07/17/2021   CHOLHDL 4.0 07/17/2021   VLDL 16 07/17/2021   LDLCALC 128 (H) 07/17/2021   LDLCALC 91 03/24/2021    Physical Findings: AIMS: Facial and Oral Movements Muscles of Facial Expression: None, normal Lips and Perioral Area: None, normal Jaw: None, normal Tongue: None, normal,Extremity Movements Upper (arms, wrists, hands, fingers): None, normal Lower (legs, knees, ankles, toes): None, normal, Trunk Movements Neck, shoulders, hips: None, normal, Overall Severity Severity of abnormal movements (highest score from questions above): None, normal Incapacitation due to abnormal movements: None, normal Patient's awareness of abnormal movements (rate only patient's report): No Awareness, Dental Status Current problems with teeth and/or dentures?: No Does patient usually wear dentures?: No  CIWA:  CIWA-Ar Total: 0 COWS:     Musculoskeletal: Strength & Muscle Tone: within normal limits Gait & Station:  remains in bed Patient leans: N/A  Psychiatric Specialty Exam:  Presentation  General Appearance: Appropriate for Environment  Eye Contact:Fair  Speech:Clear and Coherent  Speech Volume:Decreased  Handedness:Right   Mood and Affect  Mood:Dysphoric  Affect:Depressed   Thought Process  Thought Processes:Goal Directed  Descriptions of  Associations:intact  Orientation:Full (Time, Place and Person)  Thought Content:Denies AVH, paranoia, or delusions  History of Schizophrenia/Schizoaffective disorder:No  Duration of Psychotic Symptoms:No data recorded Hallucinations:Hallucinations: None  Ideas of Reference:None  Suicidal Thoughts:Suicidal Thoughts: Yes, Passive SI Passive Intent and/or Plan: Without Plan  Homicidal Thoughts:Homicidal Thoughts: No   Sensorium  Memory:Immediate Good; Recent Good; Remote Good  Judgment:Impaired  Insight:None   Executive Functions  Concentration:Fair  Attention Span:Fair  Recall:Fair  Fund of Knowledge:Fair  Language:Fair   Psychomotor Activity  Psychomotor Activity:Psychomotor Activity: Psychomotor Retardation   Assets  Assets:Resilience   Sleep  Sleep:Sleep: Fair    Physical Exam: Physical Exam HENT:     Head: Normocephalic and atraumatic.  Pulmonary:     Effort: Pulmonary effort is normal.  Neurological:     General: No focal deficit present.     Mental Status: He is alert.   Review of Systems  Constitutional:  Negative for chills and fever.  HENT:  Negative for hearing loss.   Eyes:  Negative for blurred vision.  Respiratory:  Negative for cough and wheezing.   Cardiovascular:  Negative for chest pain.  Gastrointestinal:  Negative for abdominal pain.  Neurological:  Negative for dizziness.  Psychiatric/Behavioral:  Positive for depression and suicidal ideas. Negative for hallucinations.   Blood pressure (!) 129/92, pulse 86, temperature 97.6 F (36.4 C), temperature source Oral, resp. rate 14, height 6' (1.829 m), weight 43.5 kg, SpO2 99 %. Body mass index is 13.02 kg/m.   Treatment Plan Summary: Medication management MDD without psychotic features (r/o alcohol induced depressive d/o) Patient continues to appear depressed, not participating in any groups or leaving his bed. Patient  remains very pessimistic and not very future oriented.  Initially patient could not contract for safety; however he made it very clear that he was having thoughts but had no intention to act on the thoughts. Through shared-decision making it was decided with patient that it would be best for him to not go to [facility] tomorrow, because he continues to have SI. Patient also agreed that it would be best to stop his Paxil and try a more stimulating SSRI.  - Titrate off Paxil (10mg  8/22, and none 8/23) - Start Prozac 10mg  today and increase to 20mg  tomorrow  - Patient was assigned tasks to get him out of his room today: Min of 30 min out of his room today. Patient must call his prior facility "[facility]" to f/u and if he wishes to go to [facility] of he must call himself.   EtOH Abuse/Withdrawal: -Continue Thiamine 100 mg daily -Continue Gabapentin 300 mg TID for anxiety/cravings/withdrawal -Continue Imodium 4 mg q6 PRN  -Continue Zofran 4 mg q6 PRN -Continue Multivitamin daily - Anticipate d/c to [facility] on Monday   Tobacco use disorder - Continue nicorette gum.  -Continue PRN's: Tylenol, Maalox, Atarax, Milk of Magnesia, Trazodone   PGY-2 [place], MD 07/22/2021, 2:19 PM

## 2021-07-22 NOTE — Progress Notes (Signed)
Pt didn't attend group. 

## 2021-07-22 NOTE — Progress Notes (Signed)
Patient compliant with medication. Patient is isolative in his room most of the time. Stating he does not want to be in a crowded room for groups. Endorses Passive SI with no plan and verbally contracted for safety.  Support and encouragement provided as needed. No adverse drug noted. Will continue to monitor.

## 2021-07-23 DIAGNOSIS — F332 Major depressive disorder, recurrent severe without psychotic features: Secondary | ICD-10-CM | POA: Diagnosis not present

## 2021-07-23 NOTE — [facility] Group Notes (Signed)
Type of Therapy and Topic:  Group Therapy: Anger and Coping Skills   Participation Level:  Did not attend      Description of Group:   In this group, patients learned how to recognize the physical, cognitive, emotional, and behavioral responses they have to anger-provoking situations.  They identified how they usually or often react when angered, and learned how healthy and unhealthy coping skills work initially, but the unhealthy ones stop working.   They analyzed how their frequently-chosen coping skill is possibly beneficial and how it is possibly unhelpful.  The group discussed a variety of healthier coping skills that could help in resolving the actual issues, as well as how to go about planning for the the possibility of future similar situations.   Therapeutic Goals: Patients will identify one thing that makes them angry and how they feel emotionally and physically, what their thoughts are or tend to be in those situations, and what healthy or unhealthy coping mechanism they typically use Patients will identify how their coping technique works for them, as well as how it works against them. Patients will explore possible new behaviors to use in future anger situations. Patients will learn that anger itself is normal and cannot be eliminated, and that healthier coping skills can assist with resolving conflict rather than worsening situations.   Summary of Patient Progress:  Did not attend  

## 2021-07-23 NOTE — [facility] Group Notes (Signed)
Pt did not attend AA meeting this evening.  

## 2021-07-23 NOTE — [facility] Counselor (Signed)
CSW spoke with Marcelino Duster at [facility] who states that they will hold the available bed for the Pt until Thursday.  The Pt will need to be there by 9:00am on Thursday 07/26/21.

## 2021-07-23 NOTE — Progress Notes (Signed)
Psychoeducational Group Note  Date:  07/23/2021 Time:  1842  Group Topic/Focus:  Goals Group:   The focus of this group is to help patients establish daily goals to achieve during treatment and discuss how the patient can incorporate goal setting into their daily lives to aide in recovery.  Participation Level: Did Not Attend  Participation Quality:  Not Applicable  Affect:  Not Applicable  Cognitive:  Not Applicable  Insight:  Not Applicable  Engagement in Group: Not Applicable  Additional Comments:  Pt refused to attend the group this morning.  Bryson Gavia E 07/23/2021, 6:44 PM

## 2021-07-23 NOTE — Progress Notes (Addendum)
[facility] MD Progress Note  07/23/2021 10:38 AM Carlos Flowers  MRN:  [id number] Subjective:  Carlos Flowers is a 40 yr old male who presents under IVC for SI and worsening depression. PPHx is significant for MDD, EtOH Abuse, Meth Abuse and prior hospitalization [facility] 03/2021 for similar symptoms.   On assessment today patient is in the hallway; we returned to his room for the interview. He reports that he feels "the same as last week", and that "everything is wrong". He reports continued SI, but without intent or plan and can contract for safety on the unit. He denies HI. He acknowledges that he would like to shower today and start to take steps towards feeling better so that he can go to a rehab facility. He states "it's so bad in here, I know I need to get out but I just can't do anything". Discussed with patient the importance of getting out of his room and going to group today. He states that he is having cravings for alcohol, and that "just everything" makes him want to drink. Patient reports he continues to eat and reports fair sleep. Patient denies delusions, AVH or paranoia. He denies medication side-effects with transition onto Prozac and taper off Paxil.   Principal Problem: MDD (major depressive disorder), recurrent episode, severe (HCC) Diagnosis: Principal Problem:   MDD (major depressive disorder), recurrent episode, severe (HCC) Active Problems:   Suicidal ideation   Alcohol abuse  Total Time Spent in Direct Patient Care:  I personally spent 25 minutes on the unit in direct patient care. The direct patient care time included face-to-face time with the patient, reviewing the patient's chart, communicating with other professionals, and coordinating care. Greater than 50% of this time was spent in counseling or coordinating care with the patient regarding goals of hospitalization, psycho-education, and discharge planning needs.   Past Psychiatric History: Per H&P  Past Medical History:   Past Medical History:  Diagnosis Date   Alcoholism (HCC)    Anxiety    Depression    Substance abuse (HCC)    Family History:  Family History  Problem Relation Age of Onset   Cancer Maternal Grandmother    Heart disease Paternal Grandmother    Heart disease Paternal Grandfather    Family Psychiatric  History: Per H&P  Social History:  Social History   Substance and Sexual Activity  Alcohol Use Yes   Alcohol/week: 168.0 standard drinks   Types: 168 Cans of beer per week     Social History   Substance and Sexual Activity  Drug Use Not Currently   Types: Cocaine, Heroin, Methamphetamines   Comment: Last used methamphetamines "several months ago"    Social History   Socioeconomic History   Marital status: Single    Spouse name: Not on file   Number of children: Not on file   Years of education: Not on file   Highest education level: Not on file  Occupational History   Not on file  Tobacco Use   Smoking status: Every Day    Packs/day: 1.00    Years: 17.00    Pack years: 17.00    Types: Cigarettes   Smokeless tobacco: Never  Vaping Use   Vaping Use: Never used  Substance and Sexual Activity   Alcohol use: Yes    Alcohol/week: 168.0 standard drinks    Types: 168 Cans of beer per week   Drug use: Not Currently    Types: Cocaine, Heroin, Methamphetamines    Comment: Last  used methamphetamines "several months ago"   Sexual activity: Not Currently  Other Topics Concern   Not on file  Social History Narrative   Not on file   Social Determinants of Health   Financial Resource Strain: Not on file  Food Insecurity: Not on file  Transportation Needs: Not on file  Physical Activity: Not on file  Stress: Not on file  Social Connections: Not on file   Sleep: Fair  Appetite:  Good  Current Medications: Current Facility-Administered Medications  Medication Dose Route Frequency Provider Last Rate Last Admin   acetaminophen (TYLENOL) tablet 650 mg  650 mg  Oral Q6H PRN Antonieta Pert, MD       busPIRone (BUSPAR) tablet 10 mg  10 mg Oral TID Antonieta Pert, MD   10 mg at 07/23/21 0825   cetaphil lotion   Topical PRN Lauro Franklin, MD       FLUoxetine (PROZAC) capsule 20 mg  20 mg Oral Daily Mason Jim, Eissa Buchberger E, MD   20 mg at 07/23/21 5643   gabapentin (NEURONTIN) capsule 300 mg  300 mg Oral TID Antonieta Pert, MD   300 mg at 07/23/21 3295   hydrOXYzine (ATARAX/VISTARIL) tablet 50 mg  50 mg Oral Q6H PRN Antonieta Pert, MD   50 mg at 07/23/21 0827   loperamide (IMODIUM) capsule 4 mg  4 mg Oral Q6H PRN Antonieta Pert, MD       multivitamin with minerals tablet 1 tablet  1 tablet Oral q morning Antonieta Pert, MD   1 tablet at 07/23/21 1884   nicotine polacrilex (NICORETTE) gum 2 mg  2 mg Oral PRN Antonieta Pert, MD   2 mg at 07/23/21 0827   ondansetron (ZOFRAN-ODT) disintegrating tablet 4 mg  4 mg Oral Q6H PRN Comer Locket, MD       QUEtiapine (SEROQUEL) tablet 100 mg  100 mg Oral QHS Mason Jim, Porchia Sinkler E, MD   100 mg at 07/22/21 2123   thiamine tablet 100 mg  100 mg Oral Daily Bartholomew Crews E, MD   100 mg at 07/23/21 1660   traZODone (DESYREL) tablet 50 mg  50 mg Oral QHS PRN Antonieta Pert, MD   50 mg at 07/22/21 2122    Lab Results:  No results found for this or any previous visit (from the past 48 hour(s)).   Blood Alcohol level:  Lab Results  Component Value Date   ETH <10 07/13/2021   ETH <10 04/13/2021    Metabolic Disorder Labs: Lab Results  Component Value Date   HGBA1C 5.6 07/17/2021   MPG 114.02 07/17/2021   MPG 116.89 03/24/2021   No results found for: PROLACTIN Lab Results  Component Value Date   CHOL 192 07/17/2021   TRIG 82 07/17/2021   HDL 48 07/17/2021   CHOLHDL 4.0 07/17/2021   VLDL 16 07/17/2021   LDLCALC 128 (H) 07/17/2021   LDLCALC 91 03/24/2021    Physical Findings: AIMS: Facial and Oral Movements Muscles of Facial Expression: None, normal Lips and Perioral Area:  None, normal Jaw: None, normal Tongue: None, normal,Extremity Movements Upper (arms, wrists, hands, fingers): None, normal Lower (legs, knees, ankles, toes): None, normal, Trunk Movements Neck, shoulders, hips: None, normal, Overall Severity Severity of abnormal movements (highest score from questions above): None, normal Incapacitation due to abnormal movements: None, normal Patient's awareness of abnormal movements (rate only patient's report): No Awareness, Dental Status Current problems with teeth and/or dentures?: No Does patient usually  wear dentures?: No  CIWA:  CIWA-Ar Total: 0    Musculoskeletal: Strength & Muscle Tone: within normal limits Gait & Station:  remains in bed Patient leans: N/A  Psychiatric Specialty Exam:  Presentation  General Appearance: Appropriate for Environment  Eye Contact:Fair  Speech:Clear and Coherent  Speech Volume:Decreased  Handedness:Right   Mood and Affect  Mood:Dysphoric  Affect:Depressed   Thought Process  Thought Processes:superficially goal directed, linear  Descriptions of Associations:intact  Orientation:Full (Time, Place and Person)  Thought Content:Denies AVH, paranoia, or delusions - no acute psychosis on exam  History of Schizophrenia/Schizoaffective disorder:No  Duration of Psychotic Symptoms:No data recorded Hallucinations:Hallucinations: None  Ideas of Reference:None  Suicidal Thoughts:Suicidal Thoughts: Yes, Passive SI Passive Intent and/or Plan: Without Plan  Homicidal Thoughts:Homicidal Thoughts: No   Sensorium  Memory:Immediate Good; Recent Good; Remote Good  Judgment:Impaired  Insight:Lacking   Executive Functions  Concentration:Fair  Attention Span:Fair  Recall:Fair  Fund of Knowledge:Fair  Language:Fair   Psychomotor Activity  Psychomotor Activity:Psychomotor Activity: Psychomotor Retardation   Assets  Assets:Resilience   Sleep  Sleep:Sleep: Fair    Physical  Exam: Physical Exam HENT:     Head: Normocephalic and atraumatic.  Pulmonary:     Effort: Pulmonary effort is normal.  Neurological:     General: No focal deficit present.     Mental Status: He is alert.   Review of Systems  Eyes:  Negative for blurred vision.  Respiratory:  Negative for shortness of breath.   Cardiovascular:  Negative for chest pain.  Gastrointestinal:  Negative for abdominal pain, constipation, diarrhea, nausea and vomiting.  Psychiatric/Behavioral:  Positive for depression and suicidal ideas. Negative for hallucinations.   Blood pressure (!) 119/97, pulse 96, temperature (!) 97.3 F (36.3 C), temperature source Oral, resp. rate 14, height 6' (1.829 m), weight 43.5 kg, SpO2 96 %. Body mass index is 13.02 kg/m.   Treatment Plan Summary: Medication management MDD without psychotic features (r/o alcohol induced depressive d/o) Patient continues to appear depressed, not participating in any groups or leaving his bed. Patient remains pessimistic.Today he expresses SI without a plan and can contract for safety. Through shared-decision making it was decided with patient that it would be best for him to not go to [facility] today, because he continues to have SI. Patient also agreed that it would be best to stop his Paxil and try a more stimulating SSRI.  - Continue titrating off Paxil 10mg  today and then discontinue - Started Prozac 10mg  yesterday and increase to 20mg  today  - Continue Seroquel 100mg  qhs for help with anxiety, depression and recent paranoia - Continue Gabapentin 300mg  tid for anxiety/cravings/withdrawal - Placed on a room lockout today for meals and groups    Alcohol Use disorder -Continue Thiamine 100 mg daily -Continue Gabapentin 300 mg TID for anxiety/cravings/withdrawal -Continue Imodium 4 mg q6 PRN  -Continue Zofran 4 mg q6 PRN -Continue Multivitamin daily - Social work to talk with [facility] about holding a bed for later this week and still looking  into bed availability for [facility]   Tobacco use disorder - Continue nicorette gum.  -Continue PRN's: Tylenol, Maalox, Atarax, Milk of Magnesia, Trazodone   , Medical Student 07/23/2021, 10:38 AM

## 2021-07-23 NOTE — Progress Notes (Signed)
   07/22/21 2122  Psych Admission Type (Psych Patients Only)  Admission Status Involuntary  Psychosocial Assessment  Patient Complaints Anxiety;Depression  Eye Contact Brief;Fair  Facial Expression Flat;Sad  Affect Sad;Depressed  Speech Logical/coherent;Soft  Interaction Minimal;Guarded  Motor Activity Slow  Appearance/Hygiene Unremarkable  Behavior Characteristics Cooperative;Appropriate to situation  Mood Depressed;Sad  Thought Process  Coherency WDL  Content WDL  Delusions None reported or observed  Perception WDL  Hallucination None reported or observed  Judgment Poor  Confusion None  Danger to Self  Current suicidal ideation? Denies  Self-Injurious Behavior No self-injurious ideation or behavior indicators observed or expressed   Agreement Not to Harm Self Yes  Description of Agreement Verbal contract  Danger to Others  Danger to Others None reported or observed

## 2021-07-23 NOTE — Progress Notes (Signed)
Recreation Therapy Notes  Date: 8.22.22 Time: 0930 Location: [address] Dayroom   Group Topic: Stress Management    Goal Area(s) Addresses:  Patient will actively participate in stress management techniques presented during session.  Patient will successfully identify benefit of practicing stress management post d/c.    Intervention: Relaxation exercise with ambient sound and script   Activity: Guided Imagery. LRT provided education, instruction, and demonstration on practice of visualization via guided imagery. Patient was asked to participate in the technique introduced during session.  Patients were given suggestions of ways to access scripts post d/c and encouraged to explore Youtube and other apps available on smartphones, tablets, and computers.   Education:  Stress Management, Discharge Planning.    Education Outcome:  Acknowledges education   Clinical Observations/Feedback: Patient did not attend group session.         Caroll Rancher, LRT/CTRS         Caroll Rancher A 07/23/2021 12:21 PM

## 2021-07-24 DIAGNOSIS — F1010 Alcohol abuse, uncomplicated: Secondary | ICD-10-CM

## 2021-07-24 DIAGNOSIS — F332 Major depressive disorder, recurrent severe without psychotic features: Secondary | ICD-10-CM | POA: Diagnosis not present

## 2021-07-24 DIAGNOSIS — R45851 Suicidal ideations: Secondary | ICD-10-CM | POA: Diagnosis not present

## 2021-07-24 DIAGNOSIS — F1094 Alcohol use, unspecified with alcohol-induced mood disorder: Secondary | ICD-10-CM

## 2021-07-24 MED ORDER — ADULT MULTIVITAMIN W/MINERALS CH
1.0000 | ORAL_TABLET | Freq: Every day | ORAL | Status: DC
  Administered 2021-07-25 – 2021-07-26 (×2): 1 via ORAL
  Filled 2021-07-24: qty 1
  Filled 2021-07-24: qty 14
  Filled 2021-07-24 (×2): qty 1

## 2021-07-24 MED ORDER — FLUOXETINE HCL 20 MG PO CAPS
40.0000 mg | ORAL_CAPSULE | Freq: Every day | ORAL | Status: DC
  Administered 2021-07-25 – 2021-07-26 (×2): 40 mg via ORAL
  Filled 2021-07-24: qty 28
  Filled 2021-07-24 (×2): qty 2
  Filled 2021-07-24: qty 28

## 2021-07-24 NOTE — Progress Notes (Signed)
[facility]  07/24/2021 5:54 PM Carlos Flowers Greb  MRN:  [id number] Subjective:  [name] is a 40 yr old male who presents under IVC for SI and worsening depression. PPHx is significant for MDD, EtOH Abuse, Meth Abuse and [facility] similar [facility].  Total Time Spent in Direct Patient Care:  I personally spent 25 minutes on the unit in direct patient care. The direct patient care time included face-to-face time with the patient, reviewing the patient's chart, communicating with other professionals, and coordinating care. Greater than 50% of this time was spent in counseling or coordinating care with the patient regarding goals of hospitalization, psycho-education, and discharge planning needs.  Carlos Flowers Dingus is a 40 y.o. male with a history of MDD, alcohol abuse, meth abuse, who was initially admitted for inpatient psychiatric hospitalization on 07/15/2021 for management of suicidal ideation in the context of worsening depression. The patient is currently on [facility] Day 9.   Chart Review from last 24 hours:  The patient's chart was reviewed and nursing notes were reviewed. The patient's case was discussed in multidisciplinary team meeting. [facility]   Information Obtained Today During Patient Interview: The patient was seen and evaluated on the unit.  On assessment today patient is in his room sitting on the bed. He reports that he feels "the same as yesterday". He says that although [facility], he also does not want to be at another treatment facility, stating "there's no point to me going anywhere". He reports continued SI, but without intent or plan and can contract for safety on the unit. He denies HI. He denies AVH, delusions, and paranoia. [facility] [employer] [facility] [facility], and would also like to get in touch with some family members, stating "I'm desperate." He states "I've never been this bad before. I don't  want to be around people because I feel embarrassed and anxious". Patient did not attend group yesterday while placed on room lockout. He states that he is having cravings for alcohol. When asked if he plans to use alcohol after hospitalization, he states "I mean no, I don't have any money." Patient reports he continues to eat and reports fair sleep. He denies medication side-effects with transition onto Prozac and taper off Paxil.   Principal Problem: MDD (major depressive disorder), recurrent episode, severe (HCC) Diagnosis: Principal Problem:   MDD (major depressive disorder), recurrent episode, severe (HCC) Active Problems:   Alcohol-induced mood disorder (HCC)   Suicidal ideation   Alcohol abuse  Past Psychiatric History: Per H&P  Past Medical History:  Past Medical History:  Diagnosis Date   Alcoholism (HCC)    Anxiety    Depression    Substance abuse (HCC)    Family History:  Family History  Problem Relation Age of Onset   Cancer Maternal Grandmother    Heart disease Paternal Grandmother    Heart disease Paternal Grandfather    Family Psychiatric  History: Per H&P  Social History:  Social History   Substance and Sexual Activity  Alcohol Use Yes   Alcohol/week: 168.0 standard drinks   Types: 168 Cans of beer per week     Social History   Substance and Sexual Activity  Drug Use Not Currently   Types: Cocaine, Heroin, Methamphetamines   Comment: Last used methamphetamines "several months ago"    Social History   Socioeconomic History   Marital status: Single    Spouse name:  Not on file   Number of children: Not on file   Years of education: Not on file   Highest education level: Not on file  Occupational History   Not on file  Tobacco Use   Smoking status: Every Day    Packs/day: 1.00    Years: 17.00    Pack years: 17.00    Types: Cigarettes   Smokeless tobacco: Never  Vaping Use   Vaping Use: Never used  Substance and Sexual Activity   Alcohol use:  Yes    Alcohol/week: 168.0 standard drinks    Types: 168 Cans of beer per week   Drug use: Not Currently    Types: Cocaine, Heroin, Methamphetamines    Comment: Last used methamphetamines "several months ago"   Sexual activity: Not Currently  Other Topics Concern   Not on file  Social History Narrative   Not on file   Social Determinants of Health   Financial Resource Strain: Not on file  Food Insecurity: Not on file  Transportation Needs: Not on file  Physical Activity: Not on file  Stress: Not on file  Social Connections: Not on file   Sleep: Fair  Appetite:  Good  Current Medications: Current Facility-Administered Medications  Medication Dose Route Frequency Provider Last Rate Last Admin   acetaminophen (TYLENOL) tablet 650 mg  650 mg Oral Q6H PRN Antonieta Pert, MD       busPIRone (BUSPAR) tablet 10 mg  10 mg Oral TID Antonieta Pert, MD   10 mg at 07/24/21 1720   cetaphil lotion   Topical PRN Lauro Franklin, MD       [START ON 07/25/2021] FLUoxetine (PROZAC) capsule 40 mg  40 mg Oral Daily Pashayan, Mardelle Matte, MD       gabapentin (NEURONTIN) capsule 300 mg  300 mg Oral TID Antonieta Pert, MD   300 mg at 07/24/21 1720   hydrOXYzine (ATARAX/VISTARIL) tablet 50 mg  50 mg Oral Q6H PRN Antonieta Pert, MD   50 mg at 07/24/21 4696   loperamide (IMODIUM) capsule 4 mg  4 mg Oral Q6H PRN Antonieta Pert, MD       [START ON 07/25/2021] multivitamin with minerals tablet 1 tablet  1 tablet Oral Daily Antonieta Pert, MD       nicotine polacrilex (NICORETTE) gum 2 mg  2 mg Oral PRN Antonieta Pert, MD   2 mg at 07/24/21 1721   QUEtiapine (SEROQUEL) tablet 100 mg  100 mg Oral QHS Mason Jim, Amy E, MD   100 mg at 07/23/21 2133   thiamine tablet 100 mg  100 mg Oral Daily Mason Jim, Amy E, MD   100 mg at 07/24/21 0805   traZODone (DESYREL) tablet 50 mg  50 mg Oral QHS PRN Antonieta Pert, MD   50 mg at 07/22/21 2122    Lab Results:  No results found for  this or any previous visit (from the past 48 hour(s)).   Blood Alcohol level:  Lab Results  Component Value Date   ETH <10 07/13/2021   ETH <10 04/13/2021    Metabolic Disorder Labs: Lab Results  Component Value Date   HGBA1C 5.6 07/17/2021   MPG 114.02 07/17/2021   MPG 116.89 03/24/2021   No results found for: PROLACTIN Lab Results  Component Value Date   CHOL 192 07/17/2021   TRIG 82 07/17/2021   HDL 48 07/17/2021   CHOLHDL 4.0 07/17/2021   VLDL 16 07/17/2021   LDLCALC  128 (H) 07/17/2021   LDLCALC 91 03/24/2021    Physical Findings: AIMS: Facial and Oral Movements Muscles of Facial Expression: None, normal Lips and Perioral Area: None, normal Jaw: None, normal Tongue: None, normal,Extremity Movements Upper (arms, wrists, hands, fingers): None, normal Lower (legs, knees, ankles, toes): None, normal, Trunk Movements Neck, shoulders, hips: None, normal, Overall Severity Severity of abnormal movements (highest score from questions above): None, normal Incapacitation due to abnormal movements: None, normal Patient's awareness of abnormal movements (rate only patient's report): No Awareness, Dental Status Current problems with teeth and/or dentures?: No Does patient usually wear dentures?: No  CIWA:  CIWA-Ar Total: 0    Musculoskeletal: Strength & Muscle Tone: within normal limits Gait & Station:  remains in bed Patient leans: N/A  Psychiatric Specialty Exam:  Presentation  General Appearance: Appropriate for Environment  Eye Contact:Fair  Speech:Clear and Coherent  Speech Volume:Decreased  Handedness:Right   Mood and Affect  Mood:Depressed; Hopeless; Worthless  Affect:Depressed; Restricted   Thought Process  Thought Processes:superficially goal directed, linear  Descriptions of Associations:intact  Orientation:Full (Time, Place and Person)  Thought Content:Denies AVH, paranoia, or delusions - no acute psychosis on exam  History of  Schizophrenia/Schizoaffective disorder:No  Duration of Psychotic Symptoms:No data recorded Hallucinations:Hallucinations: None  Ideas of Reference:None  Suicidal Thoughts:Suicidal Thoughts: Yes, Active SI Active Intent and/or Plan: Without Plan; Without Means to Carry Out  Homicidal Thoughts:Homicidal Thoughts: No   Sensorium  Memory:Immediate Good; Recent Good; Remote Good  Judgment:Impaired  Insight:Lacking   Executive Functions  Concentration:Fair  Attention Span:Fair  Recall:Fair  Fund of Knowledge:Fair  Language:Fair   Psychomotor Activity  Psychomotor Activity:Psychomotor Activity: Psychomotor Retardation   Assets  Assets:Communication Skills; Resilience   Sleep  Sleep:Sleep: Poor Number of Hours of Sleep: 4.75    Physical Exam: Physical Exam HENT:     Head: Normocephalic and atraumatic.  Pulmonary:     Effort: Pulmonary effort is normal.  Neurological:     General: No focal deficit present.     Mental Status: He is alert.   Review of Systems  Respiratory:  Negative for shortness of breath.   Cardiovascular:  Negative for chest pain.  Gastrointestinal:  Negative for abdominal pain, constipation, diarrhea, nausea and vomiting.  Psychiatric/Behavioral:  Positive for depression and suicidal ideas. Negative for hallucinations.   Blood pressure 115/86, pulse 90, temperature 97.8 F (36.6 C), temperature source Oral, resp. rate 18, height 6' (1.829 m), weight 43.5 kg, SpO2 98 %. Body mass index is 13.02 kg/m.   Treatment Plan Summary: Medication management MDD without psychotic features (r/o alcohol induced depressive d/o) Patient continues to appear depressed, not participating in any groups or leaving his bed. Patient remains pessimistic.Today he expresses SI without a plan and can contract for safety while on the unit. Through shared-decision making it was decided with patient that it would be best for him to not go to [facility] today, because he  continues to have SI. Patient also agreed that it would be best to stop his Paxil and try a more stimulating SSRI.  - Discontinue Paxil  - Increase Prozac to 40mg  today - Continue Seroquel 100mg  qhs for help with anxiety, depression and recent paranoia - Continue Gabapentin 300mg  tid for anxiety/cravings/withdrawal - Placed on a room lockout yesterday for meals and groups    Alcohol Use disorder -Continue Thiamine 100 mg daily -Continue Gabapentin 300 mg TID for anxiety/cravings/withdrawal -Continue Imodium 4 mg q6 PRN  -Continue Zofran 4 mg q6 PRN -Continue Multivitamin daily - Social  work to talk with [facility] about holding a bed for later this week and still looking into bed availability for [facility]   Tobacco use disorder - Continue nicorette gum.  -Continue PRN's: Tylenol, Maalox, Atarax, Milk of Magnesia, Trazodone   Filomena Jungling, Medical Student, contributed to this note 07/24/2021, 9:58 AM     Mariel Craft, MD 07/24/2021, 5:54 PM

## 2021-07-24 NOTE — B[facility] Group Notes (Addendum)
    Spiritual care group on grief and loss facilitated by chaplain Dyanne Carrel, [facility]   Group Goal:   Support / Education around grief and loss   Members engage in facilitated group support and psycho-social education.   Group Description:   Following introductions and group rules, group members engaged in facilitated group dialog and support around topic of loss, with particular support around experiences of loss in their lives. Group Identified types of loss (relationships / self / things) and identified patterns, circumstances, and changes that precipitate losses. Reflected on thoughts / feelings around loss, normalized grief responses, and recognized variety in grief experience. Group noted Worden's four tasks of grief in discussion.   Group drew on Adlerian / Rogerian, narrative, MI,   Patient Progress:  Carlos Flowers participated in group and was quiet but engaged in group conversation.  Chaplain Dyanne Carrel, Bcc Pager, [phone] 3:25 PM

## 2021-07-24 NOTE — Progress Notes (Signed)
Pt is alert and oriented to person, place, time and situation. Pt is calm, cooperative, denies SI/HI/AVH. Pt continues to be monitor per Q15 min face checks and monitor for safety and progress.

## 2021-07-24 NOTE — Progress Notes (Signed)
Patient requested to meet.  He was grieving the loss of a job that he had and the life that he could have had.  He stated that he has a hard time focusing on other things and feels that maybe what he needs is to grieve.  I provided listening presence and he stated that having shared it was helpful.  Chaplain Dyanne Carrel, Bcc Pager, [phone] 3:30 PM

## 2021-07-24 NOTE — Progress Notes (Signed)
   07/23/21 2133  Psych Admission Type (Psych Patients Only)  Admission Status Involuntary  Psychosocial Assessment  Patient Complaints Depression  Eye Contact Brief;Fair  Facial Expression Flat;Sad  Affect Blunted  Speech Logical/coherent;Soft  Interaction Minimal;Guarded  Motor Activity Slow  Appearance/Hygiene Unremarkable  Behavior Characteristics Cooperative  Mood Depressed;Sad  Thought Process  Coherency WDL  Content WDL  Delusions None reported or observed  Perception WDL  Hallucination None reported or observed  Judgment Poor  Confusion None  Danger to Self  Current suicidal ideation? Passive  Self-Injurious Behavior No self-injurious ideation or behavior indicators observed or expressed   Agreement Not to Harm Self Yes  Description of Agreement Verbal contract  Danger to Others  Danger to Others None reported or observed

## 2021-07-25 DIAGNOSIS — R45851 Suicidal ideations: Secondary | ICD-10-CM | POA: Diagnosis not present

## 2021-07-25 DIAGNOSIS — F1094 Alcohol use, unspecified with alcohol-induced mood disorder: Secondary | ICD-10-CM | POA: Diagnosis not present

## 2021-07-25 DIAGNOSIS — F1010 Alcohol abuse, uncomplicated: Secondary | ICD-10-CM | POA: Diagnosis not present

## 2021-07-25 DIAGNOSIS — F332 Major depressive disorder, recurrent severe without psychotic features: Secondary | ICD-10-CM | POA: Diagnosis not present

## 2021-07-25 MED ORDER — QUETIAPINE FUMARATE 100 MG PO TABS
100.0000 mg | ORAL_TABLET | Freq: Every day | ORAL | 0 refills | Status: DC
Start: 2021-07-25 — End: 2021-07-28

## 2021-07-25 MED ORDER — TRAZODONE HCL 50 MG PO TABS: 50.0000 mg | ORAL_TABLET | Freq: Every evening | ORAL | 0 refills | Status: DC | PRN

## 2021-07-25 MED ORDER — HYDROXYZINE HCL 50 MG PO TABS: 50.0000 mg | ORAL_TABLET | Freq: Four times a day (QID) | ORAL | 0 refills | Status: DC | PRN

## 2021-07-25 MED ORDER — CETAPHIL MOISTURIZING EX LOTN: TOPICAL_LOTION | CUTANEOUS | 0 refills | Status: AC | PRN

## 2021-07-25 MED ORDER — BUSPIRONE HCL 10 MG PO TABS: 10.0000 mg | ORAL_TABLET | Freq: Three times a day (TID) | ORAL | 0 refills | Status: DC

## 2021-07-25 MED ORDER — GABAPENTIN 300 MG PO CAPS: 300.0000 mg | ORAL_CAPSULE | Freq: Three times a day (TID) | ORAL | 0 refills | Status: DC

## 2021-07-25 MED ORDER — FLUOXETINE HCL 40 MG PO CAPS: 40.0000 mg | ORAL_CAPSULE | Freq: Every day | ORAL | 0 refills | Status: DC

## 2021-07-25 MED ORDER — THIAMINE HCL 100 MG PO TABS: 100.0000 mg | ORAL_TABLET | Freq: Every day | ORAL | 0 refills | Status: AC

## 2021-07-25 MED ORDER — NICOTINE POLACRILEX 2 MG MT GUM: 2.0000 mg | CHEWING_GUM | OROMUCOSAL | 0 refills | Status: AC | PRN

## 2021-07-25 NOTE — [facility] Suicide Risk Assessment (Signed)
[facility] Discharge Suicide Risk Assessment   Principal Problem: MDD (major depressive disorder), recurrent episode, severe (HCC) Discharge Diagnoses: Principal Problem:   MDD (major depressive disorder), recurrent episode, severe (HCC) Active Problems:   Alcohol-induced mood disorder (HCC)   Suicidal ideation   Alcohol abuse   Total Time spent with patient:  25 minutes Patient was seen and discussed with treatment team.  On day prior to discharge, following sustained improvement in the affect of this patient, adequate interaction with peers, and denial of adverse reactions from medications, the treatment team decided Burris Matherne was stable for discharge to a residential treatment facility for ongoing substance use rehabilitation treatment.  Musculoskeletal: Strength & Muscle Tone: within normal limits Gait & Station: normal Patient leans: N/A  Psychiatric Specialty Exam  Presentation  General Appearance: Appropriate for Environment  Eye Contact:Fair  Speech:Clear and Coherent  Speech Volume:Decreased  Handedness:Right   Mood and Affect  Mood:Depressed; Hopeless; Worthless  Duration of Depression Symptoms: No data recorded Affect:Depressed; Restricted   Thought Process  Thought Processes:Coherent  Descriptions of Associations:Tangential  Orientation:Full (Time, Place and Person)  Thought Content:Logical; Rumination  History of Schizophrenia/Schizoaffective disorder:No  Duration of Psychotic Symptoms:No data recorded Hallucinations:Hallucinations: None  Ideas of Reference:None  Suicidal Thoughts:Suicidal Thoughts: Yes, Active SI Active Intent and/or Plan: Without Plan; Without Means to Carry Out  Homicidal Thoughts:Homicidal Thoughts: No   Sensorium  Memory:Immediate Good; Recent Good; Remote Good  Judgment:Impaired  Insight:None   Executive Functions  Concentration:Poor  Attention Span:Fair  Recall:Fair  Fund of  Knowledge:Fair  Language:Fair   Psychomotor Activity  Psychomotor Activity:Psychomotor Activity: Psychomotor Retardation   Assets  Assets:Communication Skills; Resilience   Sleep  Sleep:Sleep: Poor Number of Hours of Sleep: 4.75   Physical Exam: Physical Exam ROS Blood pressure 117/84, pulse 85, temperature 98.1 F (36.7 C), temperature source Oral, resp. rate 16, height 6' (1.829 m), weight 43.5 kg, SpO2 98 %. Body mass index is 13.02 kg/m.  Mental Status Per Nursing Assessment::   On Admission:  Suicidal ideation indicated by patient  Demographic Factors:  Male, Caucasian, Low socioeconomic status, and Unemployed  Loss Factors: Homeless  Historical Factors: Family history of mental illness or substance abuse  Risk Reduction Factors:   Positive social support, Positive therapeutic relationship, Positive coping skills or problem solving skills, and discharge is a step down to a residential substance use rehabilitation program  Continued Clinical Symptoms:  Anxiety Depression:   Comorbid alcohol abuse/dependence Alcohol/Substance Abuse/Dependencies  Cognitive Features That Contribute To Risk:  Thought constriction (tunnel vision)    Suicide Risk:  Minimal: No identifiable suicidal ideation.  Patients presenting with no risk factors but with morbid ruminations; may be classified as minimal risk based on the severity of the depressive symptoms   Follow-up Information     Services, [facility] Follow up.   Why: Referral made Contact information: [address] [place] [address] [phone]         [facility] Follow up.   Specialty: Addiction Medicine Why: Referral made Contact information: [address] [place] [address] [phone]         Arsenio Loader, [facility]. Go on 07/23/2021.   Why: You have a hospital follow up appointment for therapy and medication management services on  07/23/21 at 9:00 am.  This appointment will be held in person. Contact information: [address] [place] [address] [phone]  Plan Of Care/Follow-up recommendations:  Activity:  ad lib Diet:  as tolerated.  On day prior to discharge was able to engage in safety planning including plan to return to [facility] or contact emergency services if he feels unable to maintain his own safety or the safety of others. Patient had no further questions, comments, or concerns. Discharge into care of [facility] residential treatment facility.  Patient aware to return to nearest crisis center, ED or to call 911 for worsening symptoms of depression, suicidal or homicidal thoughts or AVH.  Suicide risk assessment is completed on 07/25/2021 in anticipation of her early discharge on 07/26/2021.   Mariel Craft, MD 07/25/2021, 7:06 PM

## 2021-07-25 NOTE — Progress Notes (Signed)
DAR NOTE: Pt present with flat affect and depressed mood in the unit. Pt has been isolating himself and has been in bed most of the time. Pt denies physical pain, took all his meds as scheduled. Pt's safety ensured with 15 minute and environmental checks. Pt currently denies SI/HI and A/V hallucinations. Pt verbally agrees to seek staff if SI/HI or A/VH occurs and to consult with staff before acting on these thoughts. Will continue POC.  

## 2021-07-25 NOTE — Tx Team (Addendum)
Interdisciplinary Treatment and Diagnostic Plan Update  07/25/2021 Time of Session: 9:40am Carlos Flowers MRN: [id number]  Principal Diagnosis: MDD (major depressive disorder), recurrent episode, severe (HCC)  Secondary Diagnoses: Principal Problem:   MDD (major depressive disorder), recurrent episode, severe (HCC) Active Problems:   Alcohol-induced mood disorder (HCC)   Suicidal ideation   Alcohol abuse   Current Medications:  Current Facility-Administered Medications  Medication Dose Route Frequency Provider Last Rate Last Admin   acetaminophen (TYLENOL) tablet 650 mg  650 mg Oral Q6H PRN Antonieta Pert, MD       busPIRone (BUSPAR) tablet 10 mg  10 mg Oral TID Antonieta Pert, MD   10 mg at 07/25/21 1138   cetaphil lotion   Topical PRN Lauro Franklin, MD       FLUoxetine (PROZAC) capsule 40 mg  40 mg Oral Daily Lauro Franklin, MD   40 mg at 07/25/21 9678   gabapentin (NEURONTIN) capsule 300 mg  300 mg Oral TID Antonieta Pert, MD   300 mg at 07/25/21 1138   hydrOXYzine (ATARAX/VISTARIL) tablet 50 mg  50 mg Oral Q6H PRN Antonieta Pert, MD   50 mg at 07/25/21 9381   loperamide (IMODIUM) capsule 4 mg  4 mg Oral Q6H PRN Antonieta Pert, MD       multivitamin with minerals tablet 1 tablet  1 tablet Oral Daily Antonieta Pert, MD   1 tablet at 07/25/21 0175   nicotine polacrilex (NICORETTE) gum 2 mg  2 mg Oral PRN Antonieta Pert, MD   2 mg at 07/25/21 1138   QUEtiapine (SEROQUEL) tablet 100 mg  100 mg Oral QHS Mason Jim, Amy E, MD   100 mg at 07/24/21 2156   thiamine tablet 100 mg  100 mg Oral Daily Mason Jim, Amy E, MD   100 mg at 07/25/21 1025   traZODone (DESYREL) tablet 50 mg  50 mg Oral QHS PRN Antonieta Pert, MD   50 mg at 07/24/21 2156   PTA Medications: Medications Prior to Admission  Medication Sig Dispense Refill Last Dose   busPIRone (BUSPAR) 10 MG tablet Take 10 mg by mouth 3 (three) times daily. 8am, 12pm, 9pm       carbamazepine (TEGRETOL) 200 MG tablet Take 100-200 mg by mouth See admin instructions. Start date 07/05/2021: take 1 tablet (200 mg) by mouth twice daily for four days, then take 1/2 tablet (100 mg) twice daily for 3 days, then take 1/2 tablet (100 mg) daily for 3 days      hydrOXYzine (ATARAX/VISTARIL) 25 MG tablet Take 1 tablet (25 mg total) by mouth 3 (three) times daily as needed for anxiety. (Patient not taking: No sig reported) 30 tablet 0    Multiple Vitamin (MULTIVITAMIN WITH MINERALS) TABS tablet Take 1 tablet by mouth every morning.      Omega-3 Fatty Acids (FISH OIL) 1000 MG CAPS Take 3,000 mg by mouth every morning.      PARoxetine (PAXIL) 20 MG tablet Take 20 mg by mouth every morning.      thiamine 100 MG tablet Take 100 mg by mouth every morning.      traZODone (DESYREL) 50 MG tablet Take 50 mg by mouth at bedtime as needed for sleep.       Patient Stressors: [employer] fast streamer difficulties Medication change or noncompliance Occupational concerns Substance abuse  Patient Strengths: Ability for insight Capable of independent living Communication skills Physical Health Religious Affiliation Supportive family/friends  Treatment  Modalities: Medication Management, Group therapy, Case management,  1 to 1 session with clinician, Psychoeducation, Recreational therapy.   Physician Treatment Plan for Primary Diagnosis: MDD (major depressive disorder), recurrent episode, severe (HCC) Long Term Goal(s): Improvement in symptoms so as ready for discharge   Short Term Goals: Ability to identify changes in lifestyle to reduce recurrence of condition will improve Ability to verbalize feelings will improve Ability to disclose and discuss suicidal ideas Ability to demonstrate self-control will improve Ability to identify and develop effective coping behaviors will improve Compliance with prescribed medications will improve Ability to identify triggers associated with  substance abuse/mental health issues will improve  Medication Management: Evaluate patient's response, side effects, and tolerance of medication regimen.  Therapeutic Interventions: 1 to 1 sessions, Unit Group sessions and Medication administration.  Evaluation of Outcomes: Adequate for Discharge  Physician Treatment Plan for Secondary Diagnosis: Principal Problem:   MDD (major depressive disorder), recurrent episode, severe (HCC) Active Problems:   Alcohol-induced mood disorder (HCC)   Suicidal ideation   Alcohol abuse  Long Term Goal(s): Improvement in symptoms so as ready for discharge   Short Term Goals: Ability to identify changes in lifestyle to reduce recurrence of condition will improve Ability to verbalize feelings will improve Ability to disclose and discuss suicidal ideas Ability to demonstrate self-control will improve Ability to identify and develop effective coping behaviors will improve Compliance with prescribed medications will improve Ability to identify triggers associated with substance abuse/mental health issues will improve     Medication Management: Evaluate patient's response, side effects, and tolerance of medication regimen.  Therapeutic Interventions: 1 to 1 sessions, Unit Group sessions and Medication administration.  Evaluation of Outcomes: Adequate for Discharge   RN Treatment Plan for Primary Diagnosis: MDD (major depressive disorder), recurrent episode, severe (HCC) Long Term Goal(s): Knowledge of disease and therapeutic regimen to maintain health will improve  Short Term Goals: Ability to remain free from injury will improve, Ability to verbalize frustration and anger appropriately will improve, Ability to demonstrate self-control, Ability to identify and develop effective coping behaviors will improve, and Compliance with prescribed medications will improve  Medication Management: RN will administer medications as ordered by provider, will assess  and evaluate patient's response and provide education to patient for prescribed medication. RN will report any adverse and/or side effects to prescribing provider.  Therapeutic Interventions: 1 on 1 counseling sessions, Psychoeducation, Medication administration, Evaluate responses to treatment, Monitor vital signs and CBGs as ordered, Perform/monitor CIWA, COWS, AIMS and Fall Risk screenings as ordered, Perform wound care treatments as ordered.  Evaluation of Outcomes: Adequate for Discharge   LCSW Treatment Plan for Primary Diagnosis: MDD (major depressive disorder), recurrent episode, severe (HCC) Long Term Goal(s): Safe transition to appropriate next level of care at discharge, Engage patient in therapeutic group addressing interpersonal concerns.  Short Term Goals: Engage patient in aftercare planning with referrals and resources, Increase social support, Increase ability to appropriately verbalize feelings, Facilitate acceptance of mental health diagnosis and concerns, Identify triggers associated with mental health/substance abuse issues, and Increase skills for wellness and recovery  Therapeutic Interventions: Assess for all discharge needs, 1 to 1 time with Social worker, Explore available resources and support systems, Assess for adequacy in community support network, Educate family and significant other(s) on suicide prevention, Complete Psychosocial Assessment, Interpersonal group therapy.  Evaluation of Outcomes: Adequate for Discharge   Progress in Treatment: Attending groups: Yes. Participating in groups: Yes. Taking medication as prescribed: Yes. Toleration medication: Yes. Family/Significant other contact made:  Yes, individual(s) contacted:  girlfriend Patient understands diagnosis: Yes. Discussing patient identified problems/goals with staff: Yes. Medical problems stabilized or resolved: Yes. Denies suicidal/homicidal ideation: Yes. Issues/concerns per patient  self-inventory: No.   New problem(s) identified: No, Describe:  none  New Short Term/Long Term Goal(s): detox, medication management for mood stabilization; elimination of SI thoughts; development of comprehensive mental wellness/sobriety plan   Patient Goals:  "To get stable"  Discharge Plan or Barriers: Pt has been accepted to [facility] for Monday, August 25th at [facility] for residential substance use treatment.     Reason for Continuation of Hospitalization: Medication stabilization  Estimated Length of Stay: Adequate for discharge   Attendees: Patient: Carlos Flowers 07/25/2021   Physician: Rhea Belton, DO 07/25/2021   Nursing:  07/25/2021   RN Care Manager: 07/25/2021   Social Worker: Melba Coon, LCSWA 07/25/2021  Recreational Therapist:  07/25/2021   Other:  07/25/2021   Other:  07/25/2021   Other: 07/25/2021     Scribe for Treatment Team: Aram Beecham, LCSWA 07/25/2021 2:03 PM

## 2021-07-25 NOTE — Progress Notes (Signed)
Pt was irritable this morning, but relaxes more as the day progresses.  Pt is going to [facility] tomorrow morning for rehab.  Pt denies SI/HI/AVH.  Pt using prn nicorette gum which he says calms him down.  RN will continue to monitor and provide support as needed.  07/25/21 1045  Psych Admission Type (Psych Patients Only)  Admission Status Involuntary  Psychosocial Assessment  Patient Complaints Anxiety;Depression  Eye Contact Brief;Fair  Facial Expression Flat;Sad  Affect Blunted  Speech Logical/coherent;Soft  Interaction Minimal;Guarded  Motor Activity Slow  Appearance/Hygiene Unremarkable  Behavior Characteristics Appropriate to situation  Mood Depressed  Thought Process  Coherency WDL  Content WDL  Delusions None reported or observed  Perception WDL  Hallucination None reported or observed  Judgment Poor  Confusion None  Danger to Self  Current suicidal ideation? Denies  Self-Injurious Behavior No self-injurious ideation or behavior indicators observed or expressed   Agreement Not to Harm Self Yes  Description of Agreement Verbal contract  Danger to Others  Danger to Others None reported or observed

## 2021-07-25 NOTE — Progress Notes (Signed)
Recreation Therapy Notes  Date:  8.24.22 Time: 0930 Location: [address] Dayroom  Group Topic: Stress Management  Goal Area(s) Addresses:  Patient will identify positive stress management techniques. Patient will identify benefits of using stress management post d/c.  Intervention: Stress Management  Activity :  Meditation.  LRT played a meditation that focused on being resilient in the face of adversity and managing reactions to situations beyond your control.  Education:  Stress Management, Discharge Planning.   Education Outcome: Acknowledges Education  Clinical Observations/Feedback:  Pt did not attend group session.    Senai Ramnath, LRT/CTRS         Sema Stangler A 07/25/2021 12:22 PM 

## 2021-07-25 NOTE — [facility] Group Notes (Signed)
Pt did not attend goals group. 

## 2021-07-25 NOTE — Progress Notes (Signed)
Adult Psychoeducational Group Note  Date:  07/25/2021 Time:  10:56 PM  Group Topic/Focus:  Wrap-Up Group: The focus of this group is to help patients review their daily goal of treatment and discuss progress on daily workbooks.  Participation Level:  Active  Participation Quality:  Appropriate  Affect:  Appropriate  Cognitive:  Appropriate  Insight: Appropriate  Engagement in Group:  Supportive  Modes of Intervention:  Discussion  Additional Comments: The patient participated in NA Group on this date with volunteers on how to decrease dependence on relationships while beginning to meet their own needs, build confidence, and practice assertiveness. Volunteers facilitated a discussion with the group regarding triggers and identified emotional and situational factors that affect their desire to use--the end of the Wrap-Up progress note for the NA support group.      Nicoletta Dress 07/25/2021, 10:56 PM

## 2021-07-25 NOTE — Progress Notes (Signed)
[facility] MD Progress Note  07/25/2021 10:15 AM Carlos Flowers  MRN:  [id number] Subjective:  Carlos Flowers is a 40 yr old male who presents under IVC for SI and worsening depression. PPHx is significant for MDD, EtOH Abuse, Meth Abuse and prior hospitalization [facility] 03/2021 for similar symptoms.  Total Time Spent in Direct Patient Care:  I personally spent 25 minutes on the unit in direct patient care. The direct patient care time included face-to-face time with the patient, reviewing the patient's chart, communicating with other professionals, and coordinating care. Greater than 50% of this time was spent in counseling or coordinating care with the patient regarding goals of hospitalization, psycho-education, and discharge planning needs.  Carlos Flowers is a 40 y.o. male with a history of MDD, alcohol abuse, meth abuse, who was initially admitted for inpatient psychiatric hospitalization on 07/15/2021 for management of suicidal ideation in the context of worsening depression. The patient is currently on Hospital Day 10.   Chart Review from last 24 hours:  The patient's chart was reviewed and nursing notes were reviewed. The patient's case was discussed in multidisciplinary team meeting.   Information Obtained Today During Patient Interview: The patient was seen and evaluated on the unit.  On assessment today patient is in his room laying in bed. He reports that he feels "the same as yesterday". He reports continued passive thoughts thinking people would be better off without him or he would be better off dead.  He denies any specific suicidal ideation, plan or intent.  He can contract for safety on the unit. He denies HI. He denies AVH, delusions, and paranoia. He has not been able to contact [facility] [address] He has a spot at [facility] for tomorrow, and recognizes he would prefer to be in treatment and living in a shelter. Patient did not attend group yesterday while placed on room lockout. He states  that he is having cravings for alcohol.  Patient reports he continues to eat and reports fair sleep. He denies medication side-effects with transition onto Prozac and taper off Paxil.    Principal Problem: MDD (major depressive disorder), recurrent episode, severe (HCC) Diagnosis: Principal Problem:   MDD (major depressive disorder), recurrent episode, severe (HCC) Active Problems:   Alcohol-induced mood disorder (HCC)   Suicidal ideation   Alcohol abuse  Past Psychiatric History: Per H&P  Past Medical History:  Past Medical History:  Diagnosis Date   Alcoholism (HCC)    Anxiety    Depression    Substance abuse (HCC)    Family History:  Family History  Problem Relation Age of Onset   Cancer Maternal Grandmother    Heart disease Paternal Grandmother    Heart disease Paternal Grandfather    Family Psychiatric  History: Per H&P  Social History:  Social History   Substance and Sexual Activity  Alcohol Use Yes   Alcohol/week: 168.0 standard drinks   Types: 168 Cans of beer per week     Social History   Substance and Sexual Activity  Drug Use Not Currently   Types: Cocaine, Heroin, Methamphetamines   Comment: Last used methamphetamines "several months ago"    Social History   Socioeconomic History   Marital status: Single    Spouse name: Not on file   Number of children: Not on file   Years of education: Not on file   Highest education level: Not on file  Occupational History   Not on file  Tobacco Use   Smoking status: Every  Day    Packs/day: 1.00    Years: 17.00    Pack years: 17.00    Types: Cigarettes   Smokeless tobacco: Never  Vaping Use   Vaping Use: Never used  Substance and Sexual Activity   Alcohol use: Yes    Alcohol/week: 168.0 standard drinks    Types: 168 Cans of beer per week   Drug use: Not Currently    Types: Cocaine, Heroin, Methamphetamines    Comment: Last used methamphetamines "several months ago"   Sexual activity: Not Currently   Other Topics Concern   Not on file  Social History Narrative   Not on file   Social Determinants of Health   Financial Resource Strain: Not on file  Food Insecurity: Not on file  Transportation Needs: Not on file  Physical Activity: Not on file  Stress: Not on file  Social Connections: Not on file   Sleep: Fair  Appetite:  Good  Current Medications: Current Facility-Administered Medications  Medication Dose Route Frequency Provider Last Rate Last Admin   acetaminophen (TYLENOL) tablet 650 mg  650 mg Oral Q6H PRN Antonieta Pert, MD       busPIRone (BUSPAR) tablet 10 mg  10 mg Oral TID Antonieta Pert, MD   10 mg at 07/25/21 4098   cetaphil lotion   Topical PRN Lauro Franklin, MD       FLUoxetine (PROZAC) capsule 40 mg  40 mg Oral Daily Lauro Franklin, MD   40 mg at 07/25/21 1191   gabapentin (NEURONTIN) capsule 300 mg  300 mg Oral TID Antonieta Pert, MD   300 mg at 07/25/21 4782   hydrOXYzine (ATARAX/VISTARIL) tablet 50 mg  50 mg Oral Q6H PRN Antonieta Pert, MD   50 mg at 07/25/21 9562   loperamide (IMODIUM) capsule 4 mg  4 mg Oral Q6H PRN Antonieta Pert, MD       multivitamin with minerals tablet 1 tablet  1 tablet Oral Daily Antonieta Pert, MD   1 tablet at 07/25/21 1308   nicotine polacrilex (NICORETTE) gum 2 mg  2 mg Oral PRN Antonieta Pert, MD   2 mg at 07/24/21 2158   QUEtiapine (SEROQUEL) tablet 100 mg  100 mg Oral QHS Mason Jim, Amy E, MD   100 mg at 07/24/21 2156   thiamine tablet 100 mg  100 mg Oral Daily Bartholomew Crews E, MD   100 mg at 07/25/21 6578   traZODone (DESYREL) tablet 50 mg  50 mg Oral QHS PRN Antonieta Pert, MD   50 mg at 07/24/21 2156    Lab Results:  No results found for this or any previous visit (from the past 48 hour(s)).   Blood Alcohol level:  Lab Results  Component Value Date   ETH <10 07/13/2021   ETH <10 04/13/2021    Metabolic Disorder Labs: Lab Results  Component Value Date   HGBA1C 5.6  07/17/2021   MPG 114.02 07/17/2021   MPG 116.89 03/24/2021   No results found for: PROLACTIN Lab Results  Component Value Date   CHOL 192 07/17/2021   TRIG 82 07/17/2021   HDL 48 07/17/2021   CHOLHDL 4.0 07/17/2021   VLDL 16 07/17/2021   LDLCALC 128 (H) 07/17/2021   LDLCALC 91 03/24/2021    Musculoskeletal: Strength & Muscle Tone: within normal limits Gait & Station:  remains in bed Patient leans: N/A  Psychiatric Specialty Exam:  Presentation  General Appearance: Appropriate for Environment  Eye  Contact:Fair  Speech:Clear and Coherent  Speech Volume:Decreased  Handedness:Right   Mood and Affect  Mood:Depressed; Hopeless; Worthless  Affect:Depressed; Restricted   Thought Process  Thought Processes:superficially goal directed, linear  Descriptions of Associations:intact  Orientation:Full (Time, Place and Person)  Thought Content:Denies AVH, paranoia, or delusions - no acute psychosis on exam  History of Schizophrenia/Schizoaffective disorder:No  Duration of Psychotic Symptoms:No data recorded Hallucinations:Hallucinations: None  Ideas of Reference:None  Suicidal Thoughts:Suicidal Thoughts: Yes, Active SI Active Intent and/or Plan: Without Plan; Without Means to Carry Out  Homicidal Thoughts:Homicidal Thoughts: No   Sensorium  Memory:Immediate Good; Recent Good; Remote Good  Judgment:Impaired  Insight:Lacking   Executive Functions  Concentration:Fair  Attention Span:Fair  Recall:Fair  Fund of Knowledge:Fair  Language:Fair   Psychomotor Activity  Psychomotor Activity:Psychomotor Activity: Psychomotor Retardation   Assets  Assets:Communication Skills; Resilience   Sleep  Sleep:Sleep: Poor Number of Hours of Sleep: 4.75    Physical Exam: Physical Exam HENT:     Head: Normocephalic and atraumatic.  Cardiovascular:     Rate and Rhythm: Normal rate.  Pulmonary:     Effort: Pulmonary effort is normal. No respiratory  distress.  Musculoskeletal:        General: Normal range of motion.     Cervical back: Normal range of motion.  Neurological:     General: No focal deficit present.     Mental Status: He is alert.   Review of Systems  Respiratory:  Negative for shortness of breath.   Cardiovascular:  Negative for chest pain.  Gastrointestinal:  Negative for abdominal pain, constipation, diarrhea, nausea and vomiting.  Neurological:  Negative for headaches.  Psychiatric/Behavioral:  Positive for depression and suicidal ideas. Negative for hallucinations.   Blood pressure 115/86, pulse 90, temperature 97.8 F (36.6 C), temperature source Oral, resp. rate 18, height 6' (1.829 m), weight 43.5 kg, SpO2 98 %. Body mass index is 13.02 kg/m.   Treatment Plan Summary: Medication management MDD without psychotic features (r/o alcohol induced depressive d/o) Patient continues to appear depressed, not participating in any groups or leaving his bed. Patient remains pessimistic.Today he expresses SI without a plan and can contract for safety while on the unit.  - Prozac 40mg  - Continue Seroquel 100mg  qhs for help with anxiety, depression and recent paranoia - Continue Gabapentin 300mg  tid for anxiety/cravings/withdrawal - Placed on a room lockout for meals and groups    Alcohol Use disorder -Continue Thiamine 100 mg daily -Continue Gabapentin 300 mg TID for anxiety/cravings/withdrawal -Continue Imodium 4 mg q6 PRN  -Continue Zofran 4 mg q6 PRN -Continue Multivitamin daily - Social work talked with [facility] and secured a bed for him for 8/25   Tobacco use disorder - Continue nicorette gum.  -Continue PRN's: Tylenol, Maalox, Atarax, Milk of Magnesia, Trazodone  , Medical Student, contributed to this note 07/25/2021, 10:15 AM  A suicide risk assessment is completed today in anticipation for patient's early discharge on 08/26/2021.  Filomena Jungling, Medical Student 07/25/2021, 10:15 AM

## 2021-07-25 NOTE — [facility] Group Notes (Signed)
Type of Therapy and Topic:  Group Therapy - Healthy vs Unhealthy Coping Skills  Participation Level:  Active   Description of Group The focus of this group was to determine what unhealthy coping techniques typically are used by group members and what healthy coping techniques would be helpful in coping with various problems. Patients were guided in becoming aware of the differences between healthy and unhealthy coping techniques. Patients were asked to identify 2-3 healthy coping skills they would like to learn to use more effectively.  Therapeutic Goals 1. Patients learned that coping is what human beings do all day long to deal with various situations in their lives 2. Patients defined and discussed healthy vs unhealthy coping techniques 3. Patients identified their preferred coping techniques and identified whether these were healthy or unhealthy 4. Patients determined 2-3 healthy coping skills they would like to become more familiar with and use more often. 5. Patients provided support and ideas to each other   Summary of Patient Progress:  Pt was provided with a worksheet to complete and share with the group.  Pt accepted this worksheet and was appropriate.  Pt was encouraged to speak with the social worker if they had any questions or concerns.  

## 2021-07-26 ENCOUNTER — Other Ambulatory Visit: Payer: Self-pay

## 2021-07-26 ENCOUNTER — Encounter (HOSPITAL_COMMUNITY): Payer: Self-pay | Admitting: Student

## 2021-07-26 ENCOUNTER — Other Ambulatory Visit (HOSPITAL_COMMUNITY)
Admission: EM | Admit: 2021-07-26 | Discharge: 2021-07-28 | Disposition: A | Discharge status: Home / Self Care | Payer: No Payment, Other | Attending: Psychiatry | Admitting: Psychiatry

## 2021-07-26 DIAGNOSIS — F17200 Nicotine dependence, unspecified, uncomplicated: Secondary | ICD-10-CM | POA: Diagnosis present

## 2021-07-26 DIAGNOSIS — Z765 Malingerer [conscious simulation]: Secondary | ICD-10-CM

## 2021-07-26 DIAGNOSIS — F1021 Alcohol dependence, in remission: Secondary | ICD-10-CM | POA: Insufficient documentation

## 2021-07-26 DIAGNOSIS — Z5900 Homelessness unspecified: Secondary | ICD-10-CM

## 2021-07-26 DIAGNOSIS — F331 Major depressive disorder, recurrent, moderate: Secondary | ICD-10-CM | POA: Diagnosis not present

## 2021-07-26 DIAGNOSIS — R45851 Suicidal ideations: Secondary | ICD-10-CM | POA: Diagnosis not present

## 2021-07-26 DIAGNOSIS — F17210 Nicotine dependence, cigarettes, uncomplicated: Secondary | ICD-10-CM | POA: Insufficient documentation

## 2021-07-26 DIAGNOSIS — F419 Anxiety disorder, unspecified: Secondary | ICD-10-CM | POA: Insufficient documentation

## 2021-07-26 DIAGNOSIS — Z20822 Contact with and (suspected) exposure to covid-19: Secondary | ICD-10-CM | POA: Insufficient documentation

## 2021-07-26 DIAGNOSIS — F32A Depression, unspecified: Secondary | ICD-10-CM | POA: Diagnosis present

## 2021-07-26 LAB — CBC WITH DIFFERENTIAL/PLATELET
Abs Immature Granulocytes: 0.06 10*3/uL (ref 0.00–0.07)
Basophils Absolute: 0.1 10*3/uL (ref 0.0–0.1)
Basophils Relative: 1 %
Eosinophils Absolute: 0.3 10*3/uL (ref 0.0–0.5)
Eosinophils Relative: 2 %
HCT: 46.9 % (ref 39.0–52.0)
Hemoglobin: 15.6 g/dL (ref 13.0–17.0)
Immature Granulocytes: 0 %
Lymphocytes Relative: 21 %
Lymphs Abs: 3 10*3/uL (ref 0.7–4.0)
MCH: 29.7 pg (ref 26.0–34.0)
MCHC: 33.3 g/dL (ref 30.0–36.0)
MCV: 89.3 fL (ref 80.0–100.0)
Monocytes Absolute: 0.9 10*3/uL (ref 0.1–1.0)
Monocytes Relative: 7 %
Neutro Abs: 9.8 10*3/uL — ABNORMAL HIGH (ref 1.7–7.7)
Neutrophils Relative %: 69 %
Platelets: 281 10*3/uL (ref 150–400)
RBC: 5.25 MIL/uL (ref 4.22–5.81)
RDW: 13.3 % (ref 11.5–15.5)
WBC: 14.2 10*3/uL — ABNORMAL HIGH (ref 4.0–10.5)
nRBC: 0 % (ref 0.0–0.2)

## 2021-07-26 LAB — COMPREHENSIVE METABOLIC PANEL
ALT: 63 U/L — ABNORMAL HIGH (ref 0–44)
AST: 36 U/L (ref 15–41)
Albumin: 4.7 g/dL (ref 3.5–5.0)
Alkaline Phosphatase: 87 U/L (ref 38–126)
Anion gap: 9 (ref 5–15)
BUN: 10 mg/dL (ref 6–20)
CO2: 26 mmol/L (ref 22–32)
Calcium: 9.5 mg/dL (ref 8.9–10.3)
Chloride: 103 mmol/L (ref 98–111)
Creatinine, Ser: 0.88 mg/dL (ref 0.61–1.24)
GFR, Estimated: >60 mL/min (ref >60)
Glucose, Bld: 87 mg/dL (ref 70–99)
Potassium: 4 mmol/L (ref 3.5–5.1)
Sodium: 138 mmol/L (ref 135–145)
Total Bilirubin: 0.5 mg/dL (ref 0.3–1.2)
Total Protein: 7.8 g/dL (ref 6.5–8.1)

## 2021-07-26 LAB — POCT URINE DRUG SCREEN - MANUAL ENTRY (I-SCREEN)
POC Amphetamine UR: NOT DETECTED
POC Buprenorphine (BUP): NOT DETECTED
POC Cocaine UR: NOT DETECTED
POC Marijuana UR: NOT DETECTED
POC Methadone UR: NOT DETECTED
POC Methamphetamine UR: NOT DETECTED
POC Morphine: NOT DETECTED
POC Oxazepam (BZO): NOT DETECTED
POC Oxycodone UR: NOT DETECTED
POC Secobarbital (BAR): NOT DETECTED

## 2021-07-26 LAB — POC SARS CORONAVIRUS 2 AG -  ED: SARS Coronavirus 2 Ag: NEGATIVE

## 2021-07-26 LAB — HEMOGLOBIN A1C
Hgb A1c MFr Bld: 5.8 % — ABNORMAL HIGH (ref 4.8–5.6)
Mean Plasma Glucose: 119.76 mg/dL

## 2021-07-26 LAB — RESP PANEL BY RT-PCR (FLU A&B, COVID) ARPGX2
Influenza A by PCR: NEGATIVE
Influenza B by PCR: NEGATIVE
SARS Coronavirus 2 by RT PCR: NEGATIVE

## 2021-07-26 LAB — TSH: TSH: 1.789 u[IU]/mL (ref 0.350–4.500)

## 2021-07-26 LAB — POC SARS CORONAVIRUS 2 AG: SARSCOV2ONAVIRUS 2 AG: NEGATIVE

## 2021-07-26 MED ORDER — NICOTINE POLACRILEX 2 MG MT GUM
2.0000 mg | CHEWING_GUM | Freq: Once | OROMUCOSAL | Status: AC
  Administered 2021-07-26: 2 mg via ORAL
  Filled 2021-07-26: qty 1

## 2021-07-26 MED ORDER — HYDROXYZINE HCL 25 MG PO TABS
25.0000 mg | ORAL_TABLET | Freq: Once | ORAL | Status: AC
  Administered 2021-07-26: 25 mg via ORAL
  Filled 2021-07-26: qty 1

## 2021-07-26 MED ORDER — ALUM & MAG HYDROXIDE-SIMETH 200-200-20 MG/5ML PO SUSP: 30.0000 mL | ORAL | Status: DC | PRN

## 2021-07-26 MED ORDER — ACETAMINOPHEN 325 MG PO TABS: 650.0000 mg | ORAL_TABLET | Freq: Four times a day (QID) | ORAL | Status: DC | PRN

## 2021-07-26 MED ORDER — MAGNESIUM HYDROXIDE 400 MG/5ML PO SUSP: 30.0000 mL | Freq: Every day | ORAL | Status: DC | PRN

## 2021-07-26 MED ORDER — NICOTINE 21 MG/24HR TD PT24: 21.0000 mg | MEDICATED_PATCH | Freq: Every day | TRANSDERMAL | Status: DC

## 2021-07-26 MED ORDER — HYDROXYZINE HCL 25 MG PO TABS
25.0000 mg | ORAL_TABLET | Freq: Three times a day (TID) | ORAL | Status: DC | PRN
  Administered 2021-07-26 – 2021-07-28 (×3): 25 mg via ORAL
  Filled 2021-07-26 (×3): qty 1

## 2021-07-26 NOTE — ED Notes (Signed)
PT TRANSFERRED TO [facility]

## 2021-07-26 NOTE — ED Notes (Signed)
Meal provided 

## 2021-07-26 NOTE — Discharge Summary (Signed)
Physician Discharge Summary Note  Patient:  Carlos Flowers is an 40 y.o., male MRN:  [id number] DOB:  1981-08-30 Patient phone:  [phone] (home)  Patient address:   [address] [place] [address],  Total Time spent with patient: 30 minutes  Date of Admission:  07/15/2021 Date of Discharge: 07/26/2021  Reason for Admission:  Per H&P- "Carlos Flowers is a 40 y.o. male with past psychiatric history of alcohol use disorder x5 years and past psychiatric hospitalization for suicidal ideation and alcohol use disorder who presented via [facility] with the [facility] PD as an involuntary admission with worsening SI and complaints of panic attacks.    Per chart review, he has been staying at [facility]. He states that he has had "worsening panic attacks, anxiety, and withdrawing from alcohol" at [facility], and wanted to seek treatment with Ativan, which was why he presented to the ED. Per interview, he states that he has lost interest in sports, which he has enjoyed in the past, for "years". He states that for the past several months, he has felt sad, guilty, and "more down than before". He states that he has slept more than typical for him, and had decreased appetite for the past several months as well. He states that he has had periods of depression while not using alcohol as well, however that his depression is worse when he is using alcohol. He endorses feelings of worthlessness, stating that he "does not have motivation to be alive". He denies any prior suicide attempt or plan to complete suicide. He denies family history of bipolar disorder, schizophrenia, or suicide completion, and endorses maternal family history of depression and anxiety. He does not currently report HI or AVH.    Pertaining to his alcohol use disorder, he states that he has drank "a case of beer a day" for the past four years. He has experienced withdrawal from alcohol in the past, and states that his symptoms  this time around are worse than prior withdrawals. He states that he "just needs some Ativan". He endorses a remote history of Meth use, but states it has been several months since last use. He endorses symptoms of anxiety, tremulousness, night sweats, nausea, headache, and dizziness. He denies vomiting, diarrhea, and constipation. He denies prior seizure.   He had one prior hospitalization at [facility] in April of 2022 for alcohol abuse and SI, from which he was discharged on gabapentin 100mg  BID, hydroxyzine 25mg  TID, paroxetine 20mg  qd, and trazodone 50mg  prn. He reports that these medications helped "to an extent", though he stopped taking them shortly after hospitalization, and self-medicated with alcohol. Additionally, he presented to [facility] in May of 2022 for SI and alcohol detox. He presented to 12 different emergency departments from 03/22/21 until 04/12/21. "  Principal Problem: MDD (major depressive disorder), recurrent episode, severe (HCC) Discharge Diagnoses: Principal Problem:   MDD (major depressive disorder), recurrent episode, severe (HCC) Active Problems:   Alcohol-induced mood disorder (HCC)   Suicidal ideation   Alcohol abuse   Past Psychiatric History: MDD, Anxiety, EtOH abuse, last hospitalization [facility] 03/2021.  Past Medical History:  Past Medical History:  Diagnosis Date   Alcoholism (HCC)    Anxiety    Depression    Substance abuse (HCC)    History reviewed. No pertinent surgical history. Family History:  Family History  Problem Relation Age of Onset   Cancer Maternal Grandmother    Heart disease Paternal Grandmother    Heart disease Paternal Grandfather  Family Psychiatric  History: Mother: Depression, Anxiety Maternal side- multiple depression Social History:  Social History   Substance and Sexual Activity  Alcohol Use Yes   Alcohol/week: 168.0 standard drinks   Types: 168 Cans of beer per week     Social History   Substance and Sexual Activity  Drug Use  Not Currently   Types: Cocaine, Heroin, Methamphetamines   Comment: Last used methamphetamines "several months ago"    Social History   Socioeconomic History   Marital status: Single    Spouse name: Not on file   Number of children: Not on file   Years of education: Not on file   Highest education level: Not on file  Occupational History   Not on file  Tobacco Use   Smoking status: Every Day    Packs/day: 1.00    Years: 17.00    Pack years: 17.00    Types: Cigarettes   Smokeless tobacco: Never  Vaping Use   Vaping Use: Never used  Substance and Sexual Activity   Alcohol use: Yes    Alcohol/week: 168.0 standard drinks    Types: 168 Cans of beer per week   Drug use: Not Currently    Types: Cocaine, Heroin, Methamphetamines    Comment: Last used methamphetamines "several months ago"   Sexual activity: Not Currently  Other Topics Concern   Not on file  Social History Narrative   Not on file   Social Determinants of Health   Financial Resource Strain: Not on file  Food Insecurity: Not on file  Transportation Needs: Not on file  Physical Activity: Not on file  Stress: Not on file  Social Connections: Not on file    Hospital Course:  Patient presented to [facility] on 8/12 under IVC via [facility] due to worsening depression and SI.  He had been at [facility] for 1 week and after telling staff about his SI but excellent may IVC him.  Patient was then admitted to [facility] on 8/15.  He was continued on his home medication of Paxil and started on gabapentin and Tegretol.  After few days it was noted that his symptoms were not improving so the Tegretol was tapered off and on 8/18 he was started on Seroquel for augmentation of his depression and for reported paranoia.  On 8/21 after continued depression with no signs of improvement it was decided to taper off the Paxil and switch him to Prozac.  At this point he was also not leaving the room frequently so he was placed on a room lockout order  during meals and groups.  During this entire hospitalization social work worked very hard to find placement for the patient.  He has a history of leaving several facilities early and as a result of not being allowed back at many of them.  Social work discussed with him multiple times that he had very few options left available to him.  Eventually social work was able to get him a spot at [facility].  He was informed that he had been offered a bed at [facility] and that if he did not complete the treatment there there would be no other bed offers for him and he would have to go to a shelter.  He reported understanding and was discharged to [facility].   On day of discharge he reports that he thinks he is not doing well.  He reports he slept okay.  He reports he ate okay.  He reports no HI or AVH.  When asked about SI in the context of discharge he says he continues to have passive thoughts.  However, he contracts for safety otherwise.  Discussed with him that he does not have any other choices [facility]  Discussed that every other facility had declined him as he had left early from all the programs.  He he then stated "well then [facility] always have them bring the right back in the ED and will do this whole thing again."  Discussed with him that if he did not complete this program there would be no other offers for him and he would have to go to a shelter.  [facility]  [facility]  Later that day after discharge [facility] and did not feel safe and so was brought back here.  [facility] and they informed us that when patient got there he was told 3 times that he could not smoke in the facility and so he said that he did not want to be there again.  Physical Findings:  Musculoskeletal: Strength & Muscle Tone: within normal limits Gait & Station: normal Patient  leans: N/A   Psychiatric Specialty Exam:  Presentation  General Appearance: Appropriate for Environment; Casual  Eye Contact:Good  Speech:Clear and Coherent; Normal Rate  Speech Volume:Normal  Handedness:Right   Mood and Affect  Mood:Dysphoric; Anxious  Affect:Depressed   Thought Process  Thought Processes:Coherent  Descriptions of Associations:Intact  Orientation:Full (Time, Place and Person)  Thought Content:Logical; Rumination  History of Schizophrenia/Schizoaffective disorder:No  Duration of Psychotic Symptoms:No data recorded Hallucinations:Hallucinations: None Ideas of Reference:None  Suicidal Thoughts:Suicidal Thoughts: Yes, Passive SI Passive Intent and/or Plan: Without Intent; Without Plan Homicidal Thoughts:Homicidal Thoughts: No  Sensorium  Memory:Immediate Good; Recent Good  Judgment:Impaired  Insight:None   Executive Functions  Concentration:Fair  Attention Span:Fair  Recall:Fair  Fund of Knowledge:Fair  Language:Good   Psychomotor Activity  Psychomotor Activity: Psychomotor Activity: Normal  Assets  Assets:Communication Skills; Resilience   Sleep  Sleep: Sleep: Fair   Physical Exam: Physical Exam Vitals and nursing note reviewed.  Constitutional:      General: He is not in acute distress.    Appearance: Normal appearance. He is normal weight. He is not ill-appearing or toxic-appearing.  HENT:     Head: Normocephalic and atraumatic.  Pulmonary:     Effort: Pulmonary effort is normal.  Musculoskeletal:        General: Normal range of motion.  Neurological:     General: No focal deficit present.     Mental Status: He is alert.   Review of Systems  Respiratory:  Negative for cough and shortness of breath.   Cardiovascular:  Negative for chest pain.  Gastrointestinal:  Negative for abdominal pain, constipation, diarrhea, nausea and vomiting.  Neurological:  Negative for weakness and headaches.   Psychiatric/Behavioral:  Positive for depression. Negative for hallucinations and suicidal ideas. The patient is not nervous/anxious.   Blood pressure 117/84, pulse 85, temperature 98.1 F (36.7 C), temperature source Oral, resp. rate 16, height 6' (1.829 m), weight 43.5 kg, SpO2 98 %. Body mass index is 13.02 kg/m.   Social History   Tobacco Use  Smoking Status Every Day   Packs/day: 1.00   Years: 17.00   Pack years: 17.00   Types: Cigarettes  Smokeless Tobacco Never   Tobacco Cessation:  A prescription  for an FDA-approved tobacco cessation medication provided at discharge   Blood Alcohol level:  Lab Results  Component Value Date   [facility] <10 07/13/2021   ETH <10 04/13/2021    Metabolic Disorder Labs:  Lab Results  Component Value Date   HGBA1C 5.6 07/17/2021   MPG 114.02 07/17/2021   MPG 116.89 03/24/2021   No results found for: PROLACTIN Lab Results  Component Value Date   CHOL 192 07/17/2021   TRIG 82 07/17/2021   HDL 48 07/17/2021   CHOLHDL 4.0 07/17/2021   VLDL 16 07/17/2021   LDLCALC 128 (H) 07/17/2021   LDLCALC 91 03/24/2021    See Psychiatric Specialty Exam and Suicide Risk Assessment completed by Attending Physician prior to discharge.  Discharge destination:  [facility]  Is patient on multiple antipsychotic therapies at discharge:  No   Has Patient had three or more failed trials of antipsychotic monotherapy by history:  No  Recommended Plan for Multiple Antipsychotic Therapies: NA   Prescriptions given at discharge. Patient agreeable to plan. Given opportunity to ask questions. Appears to feel comfortable with discharge denies any current suicidal or homicidal thought.  Patient is also instructed prior to discharge to: Take all medications as prescribed by mental healthcare provider. Report any adverse effects and or reactions from the medicines to outpatient provider promptly. Patient has been instructed & cautioned: To not engage in  alcohol and or illegal drug use while on prescription medicines. In the event of worsening symptoms,  patient is instructed to call the crisis hotline, 911 and or go to the nearest ED for appropriate evaluation and treatment of symptoms. To follow-up with primary care provider for other medical issues, concerns and or health care needs  The patient was evaluated each day by a clinical provider to ascertain response to treatment. Improvement was noted by the patient's report of decreasing symptoms, improved sleep and appetite, affect, medication tolerance, behavior, and participation in unit programming.  Patient was asked each day to complete a self inventory noting mood, mental status, pain, new symptoms, anxiety and concerns.  Patient responded well to medication and being in a therapeutic and supportive environment. Positive and appropriate behavior was noted and the patient was motivated for recovery. The patient worked closely with the treatment team and case manager to develop a discharge plan with appropriate goals. Coping skills, problem solving as well as relaxation therapies were also part of the unit programming.  By the day of discharge patient was in much improved condition than upon admission.  Symptoms were reported as significantly decreased or resolved completely. The patient denied SI/HI and voiced no AVH. The patient was motivated to continue taking medication with a goal of continued improvement in mental health.   Patient was discharged home with a plan to follow up as noted below.   Allergies as of 07/26/2021       Reactions   Peanut-containing Drug Products Anaphylaxis        Medication List     STOP taking these medications    carbamazepine 200 MG tablet Commonly known as: TEGRETOL   Fish Oil 1000 MG Caps   PARoxetine 20 MG tablet Commonly known as: PAXIL       TAKE these medications      Indication  busPIRone 10 MG tablet Commonly known as: BUSPAR Take  1 tablet (10 mg total) by mouth 3 (three) times daily. For anxiety What changed: additional instructions  Indication: Anxiety Disorder   cetaphil lotion Apply topically as  needed for dry skin. (Provide pt with the remaining lotion).  Indication: Dry skin   FLUoxetine 40 MG capsule Commonly known as: PROZAC Take 1 capsule (40 mg total) by mouth daily. For depression  Indication: Major Depressive Disorder   gabapentin 300 MG capsule Commonly known as: NEURONTIN Take 1 capsule (300 mg total) by mouth 3 (three) times daily. For alcohol withdrawal syndrome  Indication: Alcohol Withdrawal Syndrome   hydrOXYzine 50 MG tablet Commonly known as: ATARAX/VISTARIL Take 1 tablet (50 mg total) by mouth every 6 (six) hours as needed for anxiety. What changed:  medication strength how much to take when to take this  Indication: Feeling Anxious   multivitamin with minerals Tabs tablet Take 1 tablet by mouth every morning.  Indication: Vitamin supplementation   nicotine polacrilex 2 MG gum Commonly known as: NICORETTE Take 1 each (2 mg total) by mouth as needed. (May buy from over the counter): For smoking cessation  Indication: Nicotine Addiction   QUEtiapine 100 MG tablet Commonly known as: SEROQUEL Take 1 tablet (100 mg total) by mouth at bedtime. For mood control  Indication: Mood control   thiamine 100 MG tablet Take 1 tablet (100 mg total) by mouth daily. For low thiamine What changed:  when to take this additional instructions  Indication: Deficiency of Vitamin B1   traZODone 50 MG tablet Commonly known as: DESYREL Take 1 tablet (50 mg total) by mouth at bedtime as needed for sleep.  Indication: Trouble Sleeping        Follow-up Information     Services, [facility] Follow up.   Why: Referral made Contact information: [address] [place] [address] [phone]         [facility] Follow up.   Specialty: Addiction  Medicine Why: Referral made Contact information: [address] [place] [address] [phone]         Arsenio Loader, [facility]. Go on 07/23/2021.   Why: You have a hospital follow up appointment for therapy and medication management services on 07/23/21 at 9:00 am.  This appointment will be held in person. Contact information: [address] [place] [address] [phone]                 Follow-up recommendations:   - Activity as tolerated. - Diet as recommended by PCP. - Keep all scheduled follow-up appointments as recommended.  Comments: Patient is instructed to take all prescribed medications as recommended. Report any side effects or adverse reactions to your outpatient psychiatrist. Patient is instructed to abstain from alcohol and illegal drugs while on prescription medications. In the event of worsening symptoms, patient is instructed to call the crisis hotline, 911, or go to the nearest emergency department for evaluation and treatment.  Signed: Lauro Franklin, MD 07/26/2021, 11:47 AM

## 2021-07-26 NOTE — ED Provider Notes (Signed)
Behavioral Health Admission H&P [facility] & OBS)  Date: 07/26/21 Patient Name: Carlos Flowers MRN: [id number] Chief Complaint:  Chief Complaint  Patient presents with   Suicidal   Depression   Anxiety      Diagnoses:  Final diagnoses:  Moderate episode of recurrent major depressive disorder (HCC)  Suicidal ideation  Alcohol use disorder, moderate, in early remission (HCC)  Tobacco use disorder    HPI: Patient is a 40 year old male with history of alcohol use disorder, tobacco use disorder, major depressive disorder, anxiety, and recent [facility] H admission discharged 07/26/2021.  Patient presented to [facility] with complaints of suicidal ideation with plan stating that he plan to cut his own wrists.  Patient was originally sent to [facility] following discharge from [facility] but patient stated that "he was not ready for this transition".  Patient stated that Headlee had "a couple more days" he would feel more ready to go to [facility] or "wherever".  When he has consistently what patient is looking for in terms of treatment, patient states that he is unsure and states that he has been unable to engage with treatment at pediatric such as group therapy because he has been "so depressed".  Patient states that he felt that his medications were insufficient in terms of aiding with his depression.  Patient states that he is frustrated that he is unable to get the help he needs.  Patient does admit that he has been having trouble coming to grips with reality because of the fact that he has been drinking alcohol and using substances for so long.  This Clinical research associate encouraged patient to focus on rehabilitation and making steps towards having a meaningful and productive life.  Patient was encouraged to attend group therapy while at the [facility] and was agreeable to attend.  Patient understands about he needs to make steps towards becoming more independent and more active in his treatment.  Patient denies HI AVH at this time.  Patient  states that he resumed tobacco use immediately after discharge.  Patient denies alcohol use since the 14th.  When specifically asking about patient's suicidal thoughts and plan to "cut his own wrist" patient states that he would never do anything suicidal as he "does not want to go to hell for killing himself".  Patient was last here he was safe to go to a homeless shelter until [facility] appointment on Monday, but patient states that he would likely drink alcohol and continue to have suicidal thoughts.  Per Buck Mam, patient was found to be smoking in the bathrooms as well as outside [facility].  Patient continues to smoke even after being told by [facility] staff at [facility] that he was not allowed to smoke while at [facility].  When this writer confronted patient about this he stated that he was simply told to smoke at the end of the driveway and was uncertain why he had been unable to go back to [facility].  Patient is presently scheduled for [facility] on Monday but will have social work aid in dispo for this patient should there be an earlier solution.  Patient agreeable with this plan at this time.  PHQ 2-9:  Flowsheet Row ED from 08/11/2020 in [facility]  Thoughts that you would be better off dead, or of hurting yourself in some way More than half the days  PHQ-9 Total Score 18       Flowsheet Row [facility]  from 07/13/2021 in [facility] from 04/13/2021 in [facility]  C-SSRS RISK CATEGORY High Risk High Risk Error: Q7 should not be populated when Q6 is No        Total Time spent with patient: 45 minutes  Musculoskeletal  Strength & Muscle Tone: within normal limits Gait & Station: normal Patient leans: Front  Psychiatric Specialty Exam  Presentation General Appearance: Appropriate for Environment; Casual  Eye  Contact:Good  Speech:Clear and Coherent; Normal Rate  Speech Volume:Normal  Handedness:Right   Mood and Affect  Mood:Dysphoric; Anxious  Affect:Depressed   Thought Process  Thought Processes:Coherent  Descriptions of Associations:Intact  Orientation:Full (Time, Place and Person)  Thought Content:Logical; Perseveration  Diagnosis of Schizophrenia or Schizoaffective disorder in past: No   Hallucinations:Hallucinations: None  Ideas of Reference:None  Suicidal Thoughts:Suicidal Thoughts: Yes, Passive SI Passive Intent and/or Plan: Without Intent; Without Plan  Homicidal Thoughts:Homicidal Thoughts: No   Sensorium  Memory:Immediate Good; Recent Good; Remote Good  Judgment:Impaired  Insight:None   Executive Functions  Concentration:Fair  Attention Span:Fair  Recall:Fair  Fund of Knowledge:Fair  Language:Good   Psychomotor Activity  Psychomotor Activity:Psychomotor Activity: Normal   Assets  Assets:Communication Skills; Resilience   Sleep  Sleep:Sleep: Good   No data recorded  Physical Exam Vitals and nursing note reviewed.  Constitutional:      Appearance: He is well-developed.  HENT:     Head: Normocephalic and atraumatic.  Eyes:     Conjunctiva/sclera: Conjunctivae normal.  Cardiovascular:     Rate and Rhythm: Normal rate and regular rhythm.     Heart sounds: No murmur heard. Pulmonary:     Effort: Pulmonary effort is normal. No respiratory distress.     Breath sounds: Normal breath sounds.  Abdominal:     Palpations: Abdomen is soft.     Tenderness: There is no abdominal tenderness.  Musculoskeletal:     Cervical back: Neck supple.  Skin:    General: Skin is warm and dry.  Neurological:     Mental Status: He is alert.   Review of Systems  Constitutional:  Negative for chills and fever.  Respiratory:  Negative for cough, shortness of breath and wheezing.   Cardiovascular:  Negative for chest pain and palpitations.   Gastrointestinal:  Negative for abdominal pain, nausea and vomiting.  Skin:  Negative for itching and rash.  Neurological:  Negative for dizziness and headaches.   Blood pressure (!) 139/95, pulse 94, temperature 97.8 F (36.6 C), temperature source Oral, resp. rate 18, SpO2 96 %. There is no height or weight on file to calculate BMI.  Past Psychiatric History: MDD/   Is the patient at risk to self? Yes  Has the patient been a risk to self in the past 6 months? Yes .    Has the patient been a risk to self within the distant past? Yes   Is the patient a risk to others? No   Has the patient been a risk to others in the past 6 months? No   Has the patient been a risk to others within the distant past? No   Past Medical History:  Past Medical History:  Diagnosis Date   Alcoholism (HCC)    Anxiety    Depression    Substance abuse (HCC)    No past surgical history on file.  Family History:  Family History  Problem Relation Age of Onset   Cancer Maternal Grandmother    Heart disease Paternal Grandmother  Heart disease Paternal Grandfather     Social History:  Social History   Socioeconomic History   Marital status: Single    Spouse name: Not on file   Number of children: Not on file   Years of education: Not on file   Highest education level: Not on file  Occupational History   Not on file  Tobacco Use   Smoking status: Every Day    Packs/day: 1.00    Years: 17.00    Pack years: 17.00    Types: Cigarettes   Smokeless tobacco: Never  Vaping Use   Vaping Use: Never used  Substance and Sexual Activity   Alcohol use: Yes    Alcohol/week: 168.0 standard drinks    Types: 168 Cans of beer per week   Drug use: Not Currently    Types: Cocaine, Heroin, Methamphetamines    Comment: Last used methamphetamines "several months ago"   Sexual activity: Not Currently  Other Topics Concern   Not on file  Social History Narrative   Not on file   Social Determinants of  Health   Financial Resource Strain: Not on file  Food Insecurity: Not on file  Transportation Needs: Not on file  Physical Activity: Not on file  Stress: Not on file  Social Connections: Not on file  Intimate Partner Violence: Not on file    SDOH:  SDOH Screenings   Alcohol Screen: Medium Risk   Last Alcohol Screening Score (AUDIT): 35  Depression (PHQ2-9): Medium Risk   PHQ-2 Score: 18  Financial Resource Strain: Not on file  Food Insecurity: Not on file  Housing: Not on file  Physical Activity: Not on file  Social Connections: Not on file  Stress: Not on file  Tobacco Use: High Risk   Smoking Tobacco Use: Every Day   Smokeless Tobacco Use: Never  Transportation Needs: Not on file    Last Labs:  Admission on 07/26/2021  Component Date Value Ref Range Status   POC Amphetamine UR 07/26/2021 None Detected  NONE DETECTED (Cut Off Level 1000 ng/mL) Final   POC Secobarbital (BAR) 07/26/2021 None Detected  NONE DETECTED (Cut Off Level 300 ng/mL) Final   POC Buprenorphine (BUP) 07/26/2021 None Detected  NONE DETECTED (Cut Off Level 10 ng/mL) Final   POC Oxazepam (BZO) 07/26/2021 None Detected  NONE DETECTED (Cut Off Level 300 ng/mL) Final   POC Cocaine UR 07/26/2021 None Detected  NONE DETECTED (Cut Off Level 300 ng/mL) Final   POC Methamphetamine UR 07/26/2021 None Detected  NONE DETECTED (Cut Off Level 1000 ng/mL) Final   POC Morphine 07/26/2021 None Detected  NONE DETECTED (Cut Off Level 300 ng/mL) Final   POC Oxycodone UR 07/26/2021 None Detected  NONE DETECTED (Cut Off Level 100 ng/mL) Final   POC Methadone UR 07/26/2021 None Detected  NONE DETECTED (Cut Off Level 300 ng/mL) Final   POC Marijuana UR 07/26/2021 None Detected  NONE DETECTED (Cut Off Level 50 ng/mL) Final   SARS Coronavirus 2 Ag 07/26/2021 Negative  Negative Final   SARSCOV2ONAVIRUS 2 AG 07/26/2021 NEGATIVE  NEGATIVE Final   Comment: (NOTE) SARS-CoV-2 antigen NOT DETECTED.   Negative results are  presumptive.  Negative results do not preclude SARS-CoV-2 infection and should not be used as the sole basis for treatment or other patient management decisions, including infection  control decisions, particularly in the presence of clinical signs and  symptoms consistent with COVID-19, or in those who have been in contact with the virus.  Negative results must be  combined with clinical observations, patient history, and epidemiological information. The expected result is Negative.  Fact Sheet for Patients: https://www.jennings-kim.com/https://www.fda.gov/media/141569/download  Fact Sheet for Healthcare Providers: https://alexander-rogers.biz/https://www.fda.gov/media/141568/download  This test is not yet approved or cleared by the [place] FDA and  has been authorized for detection and/or diagnosis of SARS-CoV-2 by FDA under an Emergency Use Authorization (EUA).  This EUA will remain in effect (meaning this test can be used) for the duration of  the COV                          ID-19 declaration under Section 564(b)(1) of the Act, 21 U.S.C. section 360bbb-3(b)(1), unless the authorization is terminated or revoked sooner.    Admission on 07/15/2021, Discharged on 07/26/2021  Component Date Value Ref Range Status   WBC 07/17/2021 12.7 (A) 4.0 - 10.5 K/uL Final   RBC 07/17/2021 5.08  4.22 - 5.81 MIL/uL Final   Hemoglobin 07/17/2021 15.1  13.0 - 17.0 g/dL Final   HCT 16/10/960408/16/2022 46.1  39.0 - 52.0 % Final   MCV 07/17/2021 90.7  80.0 - 100.0 fL Final   MCH 07/17/2021 29.7  26.0 - 34.0 pg Final   MCHC 07/17/2021 32.8  30.0 - 36.0 g/dL Final   RDW 54/09/811908/16/2022 13.2  11.5 - 15.5 % Final   Platelets 07/17/2021 288  150 - 400 K/uL Final   nRBC 07/17/2021 0.0  0.0 - 0.2 % Final   Neutrophils Relative % 07/17/2021 66  % Final   Neutro Abs 07/17/2021 8.3 (A) 1.7 - 7.7 K/uL Final   Lymphocytes Relative 07/17/2021 23  % Final   Lymphs Abs 07/17/2021 3.0  0.7 - 4.0 K/uL Final   Monocytes Relative 07/17/2021 8  % Final   Monocytes Absolute  07/17/2021 1.0  0.1 - 1.0 K/uL Final   Eosinophils Relative 07/17/2021 2  % Final   Eosinophils Absolute 07/17/2021 0.3  0.0 - 0.5 K/uL Final   Basophils Relative 07/17/2021 1  % Final   Basophils Absolute 07/17/2021 0.1  0.0 - 0.1 K/uL Final   Immature Granulocytes 07/17/2021 0  % Final   Abs Immature Granulocytes 07/17/2021 0.04  0.00 - 0.07 K/uL Final   Performed at [facility], [address] [place], [place] [address]   Hgb A1c MFr Bld 07/17/2021 5.6  4.8 - 5.6 % Final   Comment: (NOTE) Pre diabetes:          5.7%-6.4%  Diabetes:              >6.4%  Glycemic control for   <7.0% adults with diabetes    Mean Plasma Glucose 07/17/2021 114.02  mg/dL Final   Performed at [facility], [address] [place], [place] [address]   Cholesterol 07/17/2021 192  0 - 200 mg/dL Final   Triglycerides 30/86/578408/16/2022 82  <150 mg/dL Final   HDL 69/62/952808/16/2022 48  >40 mg/dL Final   Total CHOL/HDL Ratio 07/17/2021 4.0  RATIO Final   VLDL 07/17/2021 16  0 - 40 mg/dL Final   LDL Cholesterol 07/17/2021 128 (A) 0 - 99 mg/dL Final   Comment:        Total Cholesterol/HDL:CHD Risk Coronary Heart Disease Risk Table                     Men   Women  1/2 Average Risk   3.4   3.3  Average Risk  5.0   4.4  2 X Average Risk   9.6   7.1  3 X Average Risk  23.4   11.0        Use the calculated Patient Ratio above and the CHD Risk Table to determine the patient's CHD Risk.        ATP III CLASSIFICATION (LDL):  <100     mg/dL   Optimal  960-454  mg/dL   Near or Above                    Optimal  130-159  mg/dL   Borderline  098-119  mg/dL   High  >147     mg/dL   Very High Performed at [facility], [address], [place], [place] [address]    TSH 07/17/2021 1.063  0.350 - 4.500 uIU/mL Final   Comment: Performed by a 3rd Generation assay with a functional sensitivity of <=0.01 uIU/mL. Performed at [facility], [address]  [place], [place] [address]    WBC 07/21/2021 8.2  4.0 - 10.5 K/uL Final   RBC 07/21/2021 5.36  4.22 - 5.81 MIL/uL Final   Hemoglobin 07/21/2021 15.8  13.0 - 17.0 g/dL Final   HCT 65/78/4696 48.9  39.0 - 52.0 % Final   MCV 07/21/2021 91.2  80.0 - 100.0 fL Final   MCH 07/21/2021 29.5  26.0 - 34.0 pg Final   MCHC 07/21/2021 32.3  30.0 - 36.0 g/dL Final   RDW 29/52/8413 13.4  11.5 - 15.5 % Final   Platelets 07/21/2021 268  150 - 400 K/uL Final   nRBC 07/21/2021 0.0  0.0 - 0.2 % Final   Neutrophils Relative % 07/21/2021 62  % Final   Neutro Abs 07/21/2021 5.1  1.7 - 7.7 K/uL Final   Lymphocytes Relative 07/21/2021 26  % Final   Lymphs Abs 07/21/2021 2.2  0.7 - 4.0 K/uL Final   Monocytes Relative 07/21/2021 8  % Final   Monocytes Absolute 07/21/2021 0.7  0.1 - 1.0 K/uL Final   Eosinophils Relative 07/21/2021 2  % Final   Eosinophils Absolute 07/21/2021 0.2  0.0 - 0.5 K/uL Final   Basophils Relative 07/21/2021 1  % Final   Basophils Absolute 07/21/2021 0.1  0.0 - 0.1 K/uL Final   Immature Granulocytes 07/21/2021 1  % Final   Abs Immature Granulocytes 07/21/2021 0.04  0.00 - 0.07 K/uL Final   Performed at [facility], [address] [place], [place] [address]   Total Protein 07/21/2021 7.9  6.5 - 8.1 g/dL Final   Albumin 02/72/5366 4.6  3.5 - 5.0 g/dL Final   AST 44/01/4741 23  15 - 41 U/L Final   ALT 07/21/2021 34  0 - 44 U/L Final   Alkaline Phosphatase 07/21/2021 91  38 - 126 U/L Final   Total Bilirubin 07/21/2021 0.5  0.3 - 1.2 mg/dL Final   Bilirubin, Direct 07/21/2021 <0.1  0.0 - 0.2 mg/dL Final   Indirect Bilirubin 07/21/2021 NOT CALCULATED  0.3 - 0.9 mg/dL Final   Performed at [facility], [address] [place], [place] [address]  Admission on 07/13/2021, Discharged on 07/15/2021  Component Date Value Ref Range Status   SARS Coronavirus 2 by RT PCR 07/13/2021 NEGATIVE  NEGATIVE Final   Comment: (NOTE) SARS-CoV-2 target nucleic acids are NOT  DETECTED.  The SARS-CoV-2 RNA is generally detectable in upper respiratory specimens during the acute phase of infection. The lowest  concentration of SARS-CoV-2 viral copies this assay can detect is 138 copies/mL. A negative result does not preclude SARS-Cov-2 infection and should not be used as the sole basis for treatment or other patient management decisions. A negative result may occur with  improper specimen collection/handling, submission of specimen other than nasopharyngeal swab, presence of viral mutation(s) within the areas targeted by this assay, and inadequate number of viral copies(<138 copies/mL). A negative result must be combined with clinical observations, patient history, and epidemiological information. The expected result is Negative.  Fact Sheet for Patients:  BloggerCourse.com  Fact Sheet for Healthcare Providers:  SeriousBroker.it  This test is no                          t yet approved or cleared by the [place] FDA and  has been authorized for detection and/or diagnosis of SARS-CoV-2 by FDA under an Emergency Use Authorization (EUA). This EUA will remain  in effect (meaning this test can be used) for the duration of the COVID-19 declaration under Section 564(b)(1) of the Act, 21 U.S.C.section 360bbb-3(b)(1), unless the authorization is terminated  or revoked sooner.       Influenza A by PCR 07/13/2021 NEGATIVE  NEGATIVE Final   Influenza B by PCR 07/13/2021 NEGATIVE  NEGATIVE Final   Comment: (NOTE) The Xpert Xpress SARS-CoV-2/FLU/RSV plus assay is intended as an aid in the diagnosis of influenza from Nasopharyngeal swab specimens and should not be used as a sole basis for treatment. Nasal washings and aspirates are unacceptable for Xpert Xpress SARS-CoV-2/FLU/RSV testing.  Fact Sheet for Patients: BloggerCourse.com  Fact Sheet for Healthcare  Providers: SeriousBroker.it  This test is not yet approved or cleared by the [place] FDA and has been authorized for detection and/or diagnosis of SARS-CoV-2 by FDA under an Emergency Use Authorization (EUA). This EUA will remain in effect (meaning this test can be used) for the duration of the COVID-19 declaration under Section 564(b)(1) of the Act, 21 U.S.C. section 360bbb-3(b)(1), unless the authorization is terminated or revoked.  Performed at [facility], [address] [place], [place] [address]    Sodium 07/13/2021 136  135 - 145 mmol/L Final   Potassium 07/13/2021 4.4  3.5 - 5.1 mmol/L Final   Chloride 07/13/2021 103  98 - 111 mmol/L Final   CO2 07/13/2021 24  22 - 32 mmol/L Final   Glucose, Bld 07/13/2021 96  70 - 99 mg/dL Final   Glucose reference range applies only to samples taken after fasting for at least 8 hours.   BUN 07/13/2021 9  6 - 20 mg/dL Final   Creatinine, Ser 07/13/2021 0.87  0.61 - 1.24 mg/dL Final   Calcium 51/70/0174 9.5  8.9 - 10.3 mg/dL Final   Total Protein 94/49/6759 8.2 (A) 6.5 - 8.1 g/dL Final   Albumin 16/38/4665 4.9  3.5 - 5.0 g/dL Final   AST 99/35/7017 27  15 - 41 U/L Final   ALT 07/13/2021 32  0 - 44 U/L Final   Alkaline Phosphatase 07/13/2021 84  38 - 126 U/L Final   Total Bilirubin 07/13/2021 0.9  0.3 - 1.2 mg/dL Final   GFR, Estimated 07/13/2021 >60  >60 mL/min Final   Comment: (NOTE) Calculated using the CKD-EPI Creatinine Equation (2021)    Anion gap 07/13/2021 9  5 - 15 Final   Performed at [facility], [address] [place], [place] [address]   Alcohol, Ethyl (B)  07/13/2021 <10  <10 mg/dL Final   Comment: (NOTE) Lowest detectable limit for serum alcohol is 10 mg/dL.  For medical purposes only. Performed at [facility], [address], [place], [place] [address]    Opiates 07/13/2021 NONE DETECTED  NONE DETECTED Final   Cocaine  07/13/2021 NONE DETECTED  NONE DETECTED Final   Benzodiazepines 07/13/2021 POSITIVE (A) NONE DETECTED Final   Amphetamines 07/13/2021 NONE DETECTED  NONE DETECTED Final   Tetrahydrocannabinol 07/13/2021 NONE DETECTED  NONE DETECTED Final   Barbiturates 07/13/2021 NONE DETECTED  NONE DETECTED Final   Comment: (NOTE) DRUG SCREEN FOR MEDICAL PURPOSES ONLY.  IF CONFIRMATION IS NEEDED FOR ANY PURPOSE, NOTIFY LAB WITHIN 5 DAYS.  LOWEST DETECTABLE LIMITS FOR URINE DRUG SCREEN Drug Class                     Cutoff (ng/mL) Amphetamine and metabolites    1000 Barbiturate and metabolites    200 Benzodiazepine                 200 Tricyclics and metabolites     300 Opiates and metabolites        300 Cocaine and metabolites        300 THC                            50 Performed at [facility], [address], [place], [place] [address]    WBC 07/13/2021 10.8 (A) 4.0 - 10.5 K/uL Final   RBC 07/13/2021 5.40  4.22 - 5.81 MIL/uL Final   Hemoglobin 07/13/2021 16.1  13.0 - 17.0 g/dL Final   HCT 86/57/8469 49.5  39.0 - 52.0 % Final   MCV 07/13/2021 91.7  80.0 - 100.0 fL Final   MCH 07/13/2021 29.8  26.0 - 34.0 pg Final   MCHC 07/13/2021 32.5  30.0 - 36.0 g/dL Final   RDW 62/95/2841 13.7  11.5 - 15.5 % Final   Platelets 07/13/2021 273  150 - 400 K/uL Final   nRBC 07/13/2021 0.0  0.0 - 0.2 % Final   Neutrophils Relative % 07/13/2021 72  % Final   Neutro Abs 07/13/2021 7.9 (A) 1.7 - 7.7 K/uL Final   Lymphocytes Relative 07/13/2021 19  % Final   Lymphs Abs 07/13/2021 2.0  0.7 - 4.0 K/uL Final   Monocytes Relative 07/13/2021 6  % Final   Monocytes Absolute 07/13/2021 0.7  0.1 - 1.0 K/uL Final   Eosinophils Relative 07/13/2021 2  % Final   Eosinophils Absolute 07/13/2021 0.2  0.0 - 0.5 K/uL Final   Basophils Relative 07/13/2021 1  % Final   Basophils Absolute 07/13/2021 0.1  0.0 - 0.1 K/uL Final   Immature Granulocytes 07/13/2021 0  % Final   Abs Immature Granulocytes 07/13/2021  0.04  0.00 - 0.07 K/uL Final   Performed at [facility], [address] [place], [place] [address]  Admission on 04/13/2021, Discharged on 04/13/2021  Component Date Value Ref Range Status   SARS Coronavirus 2 by RT PCR 04/13/2021 NEGATIVE  NEGATIVE Final   Comment: (NOTE) SARS-CoV-2 target nucleic acids are NOT DETECTED.  The SARS-CoV-2 RNA is generally detectable in upper respiratory specimens during the acute phase of infection. The lowest concentration of SARS-CoV-2 viral copies this assay can detect is 138 copies/mL. A negative result does not preclude SARS-Cov-2 infection and should not be used as the sole basis for treatment  or other patient management decisions. A negative result may occur with  improper specimen collection/handling, submission of specimen other than nasopharyngeal swab, presence of viral mutation(s) within the areas targeted by this assay, and inadequate number of viral copies(<138 copies/mL). A negative result must be combined with clinical observations, patient history, and epidemiological information. The expected result is Negative.  Fact Sheet for Patients:  BloggerCourse.com  Fact Sheet for Healthcare Providers:  SeriousBroker.it  This test is no                          t yet approved or cleared by the [place] FDA and  has been authorized for detection and/or diagnosis of SARS-CoV-2 by FDA under an Emergency Use Authorization (EUA). This EUA will remain  in effect (meaning this test can be used) for the duration of the COVID-19 declaration under Section 564(b)(1) of the Act, 21 U.S.C.section 360bbb-3(b)(1), unless the authorization is terminated  or revoked sooner.       Influenza A by PCR 04/13/2021 NEGATIVE  NEGATIVE Final   Influenza B by PCR 04/13/2021 NEGATIVE  NEGATIVE Final   Comment: (NOTE) The Xpert Xpress SARS-CoV-2/FLU/RSV plus assay is intended as an  aid in the diagnosis of influenza from Nasopharyngeal swab specimens and should not be used as a sole basis for treatment. Nasal washings and aspirates are unacceptable for Xpert Xpress SARS-CoV-2/FLU/RSV testing.  Fact Sheet for Patients: BloggerCourse.com  Fact Sheet for Healthcare Providers: SeriousBroker.it  This test is not yet approved or cleared by the [place] FDA and has been authorized for detection and/or diagnosis of SARS-CoV-2 by FDA under an Emergency Use Authorization (EUA). This EUA will remain in effect (meaning this test can be used) for the duration of the COVID-19 declaration under Section 564(b)(1) of the Act, 21 U.S.C. section 360bbb-3(b)(1), unless the authorization is terminated or revoked.  Performed at [facility], [address] [place], [place] [address]    SARS Coronavirus 2 Ag 04/13/2021 Negative  Negative Preliminary   WBC 04/13/2021 15.3 (A) 4.0 - 10.5 K/uL Final   RBC 04/13/2021 4.97  4.22 - 5.81 MIL/uL Final   Hemoglobin 04/13/2021 15.3  13.0 - 17.0 g/dL Final   HCT 16/96/7893 45.9  39.0 - 52.0 % Final   MCV 04/13/2021 92.4  80.0 - 100.0 fL Final   MCH 04/13/2021 30.8  26.0 - 34.0 pg Final   MCHC 04/13/2021 33.3  30.0 - 36.0 g/dL Final   RDW 81/12/7508 13.3  11.5 - 15.5 % Final   Platelets 04/13/2021 290  150 - 400 K/uL Final   nRBC 04/13/2021 0.0  0.0 - 0.2 % Final   Neutrophils Relative % 04/13/2021 65  % Final   Neutro Abs 04/13/2021 10.1 (A) 1.7 - 7.7 K/uL Final   Lymphocytes Relative 04/13/2021 23  % Final   Lymphs Abs 04/13/2021 3.5  0.7 - 4.0 K/uL Final   Monocytes Relative 04/13/2021 10  % Final   Monocytes Absolute 04/13/2021 1.5 (A) 0.1 - 1.0 K/uL Final   Eosinophils Relative 04/13/2021 1  % Final   Eosinophils Absolute 04/13/2021 0.1  0.0 - 0.5 K/uL Final   Basophils Relative 04/13/2021 0  % Final   Basophils Absolute 04/13/2021 0.1  0.0 - 0.1 K/uL Final   Immature  Granulocytes 04/13/2021 1  % Final   Abs Immature Granulocytes 04/13/2021 0.08 (A) 0.00 - 0.07 K/uL Final   Performed at [facility], [address], [place], [place] [address]   Sodium 04/13/2021 136  135 - 145 mmol/L Final   Potassium 04/13/2021 4.2  3.5 - 5.1 mmol/L Final   Chloride 04/13/2021 96 (A) 98 - 111 mmol/L Final   CO2 04/13/2021 26  22 - 32 mmol/L Final   Glucose, Bld 04/13/2021 107 (A) 70 - 99 mg/dL Final   Glucose reference range applies only to samples taken after fasting for at least 8 hours.   BUN 04/13/2021 <5 (A) 6 - 20 mg/dL Final   Creatinine, Ser 04/13/2021 1.01  0.61 - 1.24 mg/dL Final   Calcium 60/45/4098 9.5  8.9 - 10.3 mg/dL Final   Total Protein 11/91/4782 7.5  6.5 - 8.1 g/dL Final   Albumin 95/62/1308 4.6  3.5 - 5.0 g/dL Final   AST 65/78/4696 30  15 - 41 U/L Final   ALT 04/13/2021 27  0 - 44 U/L Final   Alkaline Phosphatase 04/13/2021 111  38 - 126 U/L Final   Total Bilirubin 04/13/2021 1.5 (A) 0.3 - 1.2 mg/dL Final   GFR, Estimated 04/13/2021 >60  >60 mL/min Final   Comment: (NOTE) Calculated using the CKD-EPI Creatinine Equation (2021)    Anion gap 04/13/2021 14  5 - 15 Final   Performed at [facility], [address], [place], [place] [address]   Alcohol, Ethyl (B) 04/13/2021 <10  <10 mg/dL Final   Comment: (NOTE) Lowest detectable limit for serum alcohol is 10 mg/dL.  For medical purposes only. Performed at [facility], [address], [place], [place] [address]    POC Amphetamine UR 04/13/2021 None Detected  NONE DETECTED (Cut Off Level 1000 ng/mL) Final   POC Secobarbital (BAR) 04/13/2021 None Detected  NONE DETECTED (Cut Off Level 300 ng/mL) Final   POC Buprenorphine (BUP) 04/13/2021 None Detected  NONE DETECTED (Cut Off Level 10 ng/mL) Final   POC Oxazepam (BZO) 04/13/2021 Positive (A) NONE DETECTED (Cut Off Level 300 ng/mL) Final   POC Cocaine UR 04/13/2021 None Detected  NONE DETECTED (Cut Off Level 300 ng/mL) Final    POC Methamphetamine UR 04/13/2021 None Detected  NONE DETECTED (Cut Off Level 1000 ng/mL) Final   POC Morphine 04/13/2021 None Detected  NONE DETECTED (Cut Off Level 300 ng/mL) Final   POC Oxycodone UR 04/13/2021 None Detected  NONE DETECTED (Cut Off Level 100 ng/mL) Final   POC Methadone UR 04/13/2021 None Detected  NONE DETECTED (Cut Off Level 300 ng/mL) Final   POC Marijuana UR 04/13/2021 None Detected  NONE DETECTED (Cut Off Level 50 ng/mL) Final   SARSCOV2ONAVIRUS 2 AG 04/13/2021 NEGATIVE  NEGATIVE Final   Comment: (NOTE) SARS-CoV-2 antigen NOT DETECTED.   Negative results are presumptive.  Negative results do not preclude SARS-CoV-2 infection and should not be used as the sole basis for treatment or other patient management decisions, including infection  control decisions, particularly in the presence of clinical signs and  symptoms consistent with COVID-19, or in those who have been in contact with the virus.  Negative results must be combined with clinical observations, patient history, and epidemiological information. The expected result is Negative.  Fact Sheet for Patients: https://www.jennings-kim.com/  Fact Sheet for Healthcare Providers: https://alexander-rogers.biz/  This test is not yet approved or cleared by the [place] FDA and  has been authorized for detection and/or diagnosis of SARS-CoV-2 by FDA under an Emergency Use Authorization (EUA).  This EUA will remain in effect (meaning this test can be used) for the duration of  the COV                          ID-19 declaration under Section 564(b)(1) of the Act, 21 U.S.C. section 360bbb-3(b)(1), unless the authorization is terminated or revoked sooner.    Admission on 03/22/2021, Discharged on 03/27/2021  Component Date Value Ref Range Status   Cholesterol 03/24/2021 142  0 - 200 mg/dL Final   Triglycerides 08/65/7846 58  <150 mg/dL Final   HDL 96/29/5284 39 (A) >40 mg/dL Final    Total CHOL/HDL Ratio 03/24/2021 3.6  RATIO Final   VLDL 03/24/2021 12  0 - 40 mg/dL Final   LDL Cholesterol 03/24/2021 91  0 - 99 mg/dL Final   Comment:        Total Cholesterol/HDL:CHD Risk Coronary Heart Disease Risk Table                     Men   Women  1/2 Average Risk   3.4   3.3  Average Risk       5.0   4.4  2 X Average Risk   9.6   7.1  3 X Average Risk  23.4   11.0        Use the calculated Patient Ratio above and the CHD Risk Table to determine the patient's CHD Risk.        ATP III CLASSIFICATION (LDL):  <100     mg/dL   Optimal  132-440  mg/dL   Near or Above                    Optimal  130-159  mg/dL   Borderline  102-725  mg/dL   High  >366     mg/dL   Very High Performed at [facility], [address], [place], [place] [address]    TSH 03/24/2021 1.052  0.350 - 4.500 uIU/mL Final   Comment: Performed by a 3rd Generation assay with a functional sensitivity of <=0.01 uIU/mL. Performed at [facility], [address], [place], [place] [address]    Hgb A1c MFr Bld 03/24/2021 5.7 (A) 4.8 - 5.6 % Final   Comment: (NOTE) Pre diabetes:          5.7%-6.4%  Diabetes:              >6.4%  Glycemic control for   <7.0% adults with diabetes    Mean Plasma Glucose 03/24/2021 116.89  mg/dL Final   Performed at [facility], [address] [place], [place] [address]  Admission on 03/21/2021, Discharged on 03/22/2021  Component Date Value Ref Range Status   Sodium 03/21/2021 135  135 - 145 mmol/L Final   Potassium 03/21/2021 4.0  3.5 - 5.1 mmol/L Final   Chloride 03/21/2021 102  98 - 111 mmol/L Final   CO2 03/21/2021 21 (A) 22 - 32 mmol/L Final   Glucose, Bld 03/21/2021 101 (A) 70 - 99 mg/dL Final   Glucose reference range applies only to samples taken after fasting for at least 8 hours.   BUN 03/21/2021 13  6 - 20 mg/dL Final   Creatinine, Ser 03/21/2021 1.02  0.61 - 1.24 mg/dL Final   Calcium 56/43/3295 9.6  8.9 - 10.3  mg/dL Final   Total Protein 18/84/1660 9.2 (A) 6.5 - 8.1 g/dL Final   Albumin 63/12/6008 5.1 (A) 3.5 - 5.0 g/dL Final   AST 93/23/5573 28  15 -  41 U/L Final   ALT 03/21/2021 30  0 - 44 U/L Final   Alkaline Phosphatase 03/21/2021 83  38 - 126 U/L Final   Total Bilirubin 03/21/2021 0.9  0.3 - 1.2 mg/dL Final   GFR, Estimated 03/21/2021 >60  >60 mL/min Final   Comment: (NOTE) Calculated using the CKD-EPI Creatinine Equation (2021)    Anion gap 03/21/2021 12  5 - 15 Final   Performed at [facility], [address], [place], [place] [address]   Alcohol, Ethyl (B) 03/21/2021 <10  <10 mg/dL Final   Comment: (NOTE) Lowest detectable limit for serum alcohol is 10 mg/dL.  For medical purposes only. Performed at [facility], [address], [place], [place] [address]    Salicylate Lvl 03/21/2021 <7.0 (A) 7.0 - 30.0 mg/dL Final   Performed at [facility], [address] [place], [place] [address]   Acetaminophen (Tylenol), Serum 03/21/2021 <10 (A) 10 - 30 ug/mL Final   Comment: (NOTE) Therapeutic concentrations vary significantly. A range of 10-30 ug/mL  may be an effective concentration for many patients. However, some  are best treated at concentrations outside of this range. Acetaminophen concentrations >150 ug/mL at 4 hours after ingestion  and >50 ug/mL at 12 hours after ingestion are often associated with  toxic reactions.  Performed at [facility], [address], [place], [place] [address]    WBC 03/21/2021 8.2  4.0 - 10.5 K/uL Final   RBC 03/21/2021 5.18  4.22 - 5.81 MIL/uL Final   Hemoglobin 03/21/2021 16.3  13.0 - 17.0 g/dL Final   HCT 29/56/2130 49.3  39.0 - 52.0 % Final   MCV 03/21/2021 95.2  80.0 - 100.0 fL Final   MCH 03/21/2021 31.5  26.0 - 34.0 pg Final   MCHC 03/21/2021 33.1  30.0 - 36.0 g/dL Final   RDW 86/57/8469 13.1  11.5 - 15.5 % Final   Platelets 03/21/2021 261  150 - 400 K/uL Final    nRBC 03/21/2021 0.0  0.0 - 0.2 % Final   Performed at [facility], [address], [place], [place] [address]   Opiates 03/21/2021 NONE DETECTED  NONE DETECTED Final   Cocaine 03/21/2021 NONE DETECTED  NONE DETECTED Final   Benzodiazepines 03/21/2021 NONE DETECTED  NONE DETECTED Final   Amphetamines 03/21/2021 NONE DETECTED  NONE DETECTED Final   Tetrahydrocannabinol 03/21/2021 NONE DETECTED  NONE DETECTED Final   Barbiturates 03/21/2021 NONE DETECTED  NONE DETECTED Final   Comment: (NOTE) DRUG SCREEN FOR MEDICAL PURPOSES ONLY.  IF CONFIRMATION IS NEEDED FOR ANY PURPOSE, NOTIFY LAB WITHIN 5 DAYS.  LOWEST DETECTABLE LIMITS FOR URINE DRUG SCREEN Drug Class                     Cutoff (ng/mL) Amphetamine and metabolites    1000 Barbiturate and metabolites    200 Benzodiazepine                 200 Tricyclics and metabolites     300 Opiates and metabolites        300 Cocaine and metabolites        300 THC                            50 Performed at [facility], [address], [place], [place] [address]    SARS Coronavirus 2 by RT PCR 03/21/2021 NEGATIVE  NEGATIVE Final   Comment: (NOTE) SARS-CoV-2 target nucleic acids are NOT DETECTED.  The SARS-CoV-2 RNA is generally detectable in upper respiratory specimens during the acute phase of infection. The lowest concentration of SARS-CoV-2 viral copies this assay can detect is 138 copies/mL. A negative result does not preclude SARS-Cov-2 infection and should not be used as the sole basis for treatment or other patient management decisions. A negative result may occur with  improper specimen collection/handling, submission of specimen other than nasopharyngeal swab, presence of viral mutation(s) within the areas targeted by this assay, and inadequate number of viral copies(<138 copies/mL). A negative result must be combined with clinical observations, patient history, and  epidemiological information. The expected result is Negative.  Fact Sheet for Patients:  BloggerCourse.com  Fact Sheet for Healthcare Providers:  SeriousBroker.it  This test is no                          t yet approved or cleared by the [place] FDA and  has been authorized for detection and/or diagnosis of SARS-CoV-2 by FDA under an Emergency Use Authorization (EUA). This EUA will remain  in effect (meaning this test can be used) for the duration of the COVID-19 declaration under Section 564(b)(1) of the Act, 21 U.S.C.section 360bbb-3(b)(1), unless the authorization is terminated  or revoked sooner.       Influenza A by PCR 03/21/2021 NEGATIVE  NEGATIVE Final   Influenza B by PCR 03/21/2021 NEGATIVE  NEGATIVE Final   Comment: (NOTE) The Xpert Xpress SARS-CoV-2/FLU/RSV plus assay is intended as an aid in the diagnosis of influenza from Nasopharyngeal swab specimens and should not be used as a sole basis for treatment. Nasal washings and aspirates are unacceptable for Xpert Xpress SARS-CoV-2/FLU/RSV testing.  Fact Sheet for Patients: BloggerCourse.com  Fact Sheet for Healthcare Providers: SeriousBroker.it  This test is not yet approved or cleared by the [place] FDA and has been authorized for detection and/or diagnosis of SARS-CoV-2 by FDA under an Emergency Use Authorization (EUA). This EUA will remain in effect (meaning this test can be used) for the duration of the COVID-19 declaration under Section 564(b)(1) of the Act, 21 U.S.C. section 360bbb-3(b)(1), unless the authorization is terminated or revoked.  Performed at [facility], [address] [place], [place] [address]   Admission on 03/18/2021, Discharged on 03/18/2021  Component Date Value Ref Range Status   Troponin I (High Sensitivity) 03/18/2021 2  <18 ng/L Final   Comment:  (NOTE) Elevated high sensitivity troponin I (hsTnI) values and significant  changes across serial measurements may suggest ACS but many other  chronic and acute conditions are known to elevate hsTnI results.  Refer to the "Links" section for chest pain algorithms and additional  guidance. Performed at [facility], [address] [place], [place] [address]    Sodium 03/18/2021 133 (A) 135 - 145 mmol/L Final   Potassium 03/18/2021 3.5  3.5 - 5.1 mmol/L Final   Chloride 03/18/2021 99  98 - 111 mmol/L Final   CO2 03/18/2021 21 (A) 22 - 32 mmol/L Final   Glucose, Bld 03/18/2021 113 (A) 70 - 99 mg/dL Final   Glucose reference range applies only to samples taken after fasting for at least 8 hours.   BUN 03/18/2021 13  6 - 20 mg/dL Final   Creatinine, Ser 03/18/2021 0.94  0.61 - 1.24 mg/dL Final   Calcium 06/12/1974 9.4  8.9 - 10.3 mg/dL  Final   Total Protein 03/18/2021 8.5 (A) 6.5 - 8.1 g/dL Final   Albumin 16/09/9603 4.7  3.5 - 5.0 g/dL Final   AST 54/08/8118 31  15 - 41 U/L Final   ALT 03/18/2021 39  0 - 44 U/L Final   Alkaline Phosphatase 03/18/2021 80  38 - 126 U/L Final   Total Bilirubin 03/18/2021 0.9  0.3 - 1.2 mg/dL Final   GFR, Estimated 03/18/2021 >60  >60 mL/min Final   Comment: (NOTE) Calculated using the CKD-EPI Creatinine Equation (2021)    Anion gap 03/18/2021 13  5 - 15 Final   Performed at [facility], [address] [place], [place] [address]   WBC 03/18/2021 14.1 (A) 4.0 - 10.5 K/uL Final   RBC 03/18/2021 5.15  4.22 - 5.81 MIL/uL Final   Hemoglobin 03/18/2021 16.2  13.0 - 17.0 g/dL Final   HCT 95/62/1308 47.7  39.0 - 52.0 % Final   MCV 03/18/2021 92.6  80.0 - 100.0 fL Final   MCH 03/18/2021 31.5  26.0 - 34.0 pg Final   MCHC 03/18/2021 34.0  30.0 - 36.0 g/dL Final   RDW 65/78/4696 12.9  11.5 - 15.5 % Final   Platelets 03/18/2021 284  150 - 400 K/uL Final   nRBC 03/18/2021 0.0  0.0 - 0.2 % Final   Neutrophils Relative % 03/18/2021 73  %  Final   Neutro Abs 03/18/2021 10.4 (A) 1.7 - 7.7 K/uL Final   Lymphocytes Relative 03/18/2021 17  % Final   Lymphs Abs 03/18/2021 2.4  0.7 - 4.0 K/uL Final   Monocytes Relative 03/18/2021 7  % Final   Monocytes Absolute 03/18/2021 1.1 (A) 0.1 - 1.0 K/uL Final   Eosinophils Relative 03/18/2021 1  % Final   Eosinophils Absolute 03/18/2021 0.1  0.0 - 0.5 K/uL Final   Basophils Relative 03/18/2021 1  % Final   Basophils Absolute 03/18/2021 0.1  0.0 - 0.1 K/uL Final   Immature Granulocytes 03/18/2021 1  % Final   Abs Immature Granulocytes 03/18/2021 0.07  0.00 - 0.07 K/uL Final   Performed at [facility], [address] [place], [place] [address]  Admission on 03/16/2021, Discharged on 03/16/2021  Component Date Value Ref Range Status   Sodium 03/16/2021 132 (A) 135 - 145 mmol/L Final   Potassium 03/16/2021 3.5  3.5 - 5.1 mmol/L Final   Chloride 03/16/2021 100  98 - 111 mmol/L Final   CO2 03/16/2021 21 (A) 22 - 32 mmol/L Final   Glucose, Bld 03/16/2021 96  70 - 99 mg/dL Final   Glucose reference range applies only to samples taken after fasting for at least 8 hours.   BUN 03/16/2021 12  6 - 20 mg/dL Final   Creatinine, Ser 03/16/2021 1.08  0.61 - 1.24 mg/dL Final   Calcium 41/32/4401 9.1  8.9 - 10.3 mg/dL Final   Total Protein 02/72/5366 7.7  6.5 - 8.1 g/dL Final   Albumin 44/01/4741 4.5  3.5 - 5.0 g/dL Final   AST 59/56/3875 37  15 - 41 U/L Final   ALT 03/16/2021 41  0 - 44 U/L Final   Alkaline Phosphatase 03/16/2021 77  38 - 126 U/L Final   Total Bilirubin 03/16/2021 0.7  0.3 - 1.2 mg/dL Final   GFR, Estimated 03/16/2021 >60  >60 mL/min Final   Comment: (NOTE) Calculated using the CKD-EPI Creatinine Equation (2021)    Anion gap 03/16/2021 11  5 - 15 Final   Performed  at [facility], [address] [place], [place] [address]   Troponin I (High Sensitivity) 03/16/2021 4  <18 ng/L Final   Comment: (NOTE) Elevated high sensitivity troponin I (hsTnI) values and  significant  changes across serial measurements may suggest ACS but many other  chronic and acute conditions are known to elevate hsTnI results.  Refer to the "Links" section for chest pain algorithms and additional  guidance. Performed at [facility], [address] [place], [place] [address]    WBC 03/16/2021 15.4 (A) 4.0 - 10.5 K/uL Final   RBC 03/16/2021 5.04  4.22 - 5.81 MIL/uL Final   Hemoglobin 03/16/2021 15.8  13.0 - 17.0 g/dL Final   HCT 28/41/3244 47.0  39.0 - 52.0 % Final   MCV 03/16/2021 93.3  80.0 - 100.0 fL Final   MCH 03/16/2021 31.3  26.0 - 34.0 pg Final   MCHC 03/16/2021 33.6  30.0 - 36.0 g/dL Final   RDW 12/04/7251 13.1  11.5 - 15.5 % Final   Platelets 03/16/2021 264  150 - 400 K/uL Final   nRBC 03/16/2021 0.0  0.0 - 0.2 % Final   Neutrophils Relative % 03/16/2021 73  % Final   Neutro Abs 03/16/2021 11.3 (A) 1.7 - 7.7 K/uL Final   Lymphocytes Relative 03/16/2021 15  % Final   Lymphs Abs 03/16/2021 2.4  0.7 - 4.0 K/uL Final   Monocytes Relative 03/16/2021 9  % Final   Monocytes Absolute 03/16/2021 1.4 (A) 0.1 - 1.0 K/uL Final   Eosinophils Relative 03/16/2021 1  % Final   Eosinophils Absolute 03/16/2021 0.2  0.0 - 0.5 K/uL Final   Basophils Relative 03/16/2021 1  % Final   Basophils Absolute 03/16/2021 0.1  0.0 - 0.1 K/uL Final   Immature Granulocytes 03/16/2021 1  % Final   Abs Immature Granulocytes 03/16/2021 0.08 (A) 0.00 - 0.07 K/uL Final   Performed at [facility], [address] [place], [place] [address]   Alcohol, Ethyl (B) 03/16/2021 <10  <10 mg/dL Final   Comment:        LOWEST DETECTABLE LIMIT FOR SERUM ALCOHOL IS 10 mg/dL FOR MEDICAL PURPOSES ONLY (NOTE) Lowest detectable limit for serum alcohol is 10 mg/dL.  For medical purposes only. Performed at [facility], [address] [place], [place] [address]    Troponin I (High Sensitivity) 03/16/2021 5  <18 ng/L Final   Comment: (NOTE) Elevated high  sensitivity troponin I (hsTnI) values and significant  changes across serial measurements may suggest ACS but many other  chronic and acute conditions are known to elevate hsTnI results.  Refer to the "Links" section for chest pain algorithms and additional  guidance. Performed at [facility], [address] [place], [place] [address]     Allergies: Peanut-containing drug products  PTA Medications: (Not in a hospital admission)   Medical Decision Making  Pt to be admitted to [facility] for psychiatric stabilizaiton with plan to discharge patient to residential treatment facility.    Recommendations  Based on my evaluation the patient does not appear to have an emergency medical condition.  Park Pope, MD 07/26/21  6:00 PM

## 2021-07-26 NOTE — ED Notes (Signed)
Pt escorted to the observation unit with another staff member after skin assessment performed. Pt allowed this nurse to collect blood specimen, covid test x2 and urine w/o difficulty. Pt requested vistaril, informed provider. Safety maintained and will continue to monitor.

## 2021-07-26 NOTE — Progress Notes (Signed)
  [facility] Adult Case Management Discharge Plan :  Will you be returning to the same living situation after discharge:  No. Residential  At discharge, do you have transportation home?: Yes,  Safe Transport  Do you have the ability to pay for your medications: Yes,  Community and treatment   Release of information consent forms completed and in the chart;  Patient's signature needed at discharge.  Patient to Follow up at:  Follow-up Information     Services, [facility] Follow up.   Why: Referral made Contact information: [address] [place] [address] [phone]         [facility] Follow up.   Specialty: Addiction Medicine Why: Referral made Contact information: [address] [place] [address] [phone]         Arsenio Loader, [facility]. Go on 07/23/2021.   Why: You have a hospital follow up appointment for therapy and medication management services on 07/23/21 at 9:00 am.  This appointment will be held in person. Contact information: [address] [place] [address] [phone]                 Next level of care provider has access to [facility] Link:no  Safety Planning and Suicide Prevention discussed: Yes,  with patient      Has patient been referred to the Quitline?: Patient refused referral  Patient has been referred for addiction treatment: Yes  Aram Beecham, LCSWA 07/26/2021, 8:00 AM

## 2021-07-26 NOTE — Progress Notes (Signed)
     07/25/21 2145  Psych Admission Type (Psych Patients Only)  Admission Status Involuntary  Psychosocial Assessment  Patient Complaints Anxiety;Depression  Eye Contact Brief;Fair  Facial Expression Flat;Sad  Affect Blunted  Speech Logical/coherent;Soft  Interaction Minimal;Guarded  Motor Activity Slow  Appearance/Hygiene Unremarkable  Behavior Characteristics Appropriate to situation  Mood Depressed  Thought Process  Coherency WDL  Content WDL  Delusions None reported or observed  Perception WDL  Hallucination None reported or observed  Judgment Poor  Confusion None  Danger to Self  Current suicidal ideation? Denies  Self-Injurious Behavior No self-injurious ideation or behavior indicators observed or expressed   Agreement Not to Harm Self Yes  Description of Agreement Verbal contract for safety  Danger to Others  Danger to Others None reported or observed

## 2021-07-26 NOTE — BH Assessment (Signed)
Pt was discharged from [facility] and transported to [facility]. Pt stated that he didn't feel stable to stay at [facility] so they drove him here. Pt reports SI with plan to cut wrist. Pt reports being homeless for several months. 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 Denies any drug or alcohol use in the last 24 hours. Pt denies HI, AVH.  Pt is urgent

## 2021-07-26 NOTE — Progress Notes (Signed)
Discharge note  Patient verbalizes readiness for discharge. Follow up plan explained, AVS, Transition record and SRA given. Prescriptions and teaching provided. Belongings returned and signed for. Suicide safety plan completed and signed. Patient verbalizes understanding. Patient denies SI/HI and assures this writer they will seek assistance should that change. Patient discharged to lobby where safe transport was waiting.  

## 2021-07-26 NOTE — ED Notes (Signed)
Pt sleeping no signs of distress or pain noted resp 16 and easy with rise and fall of chest noted.

## 2021-07-26 NOTE — BH Assessment (Signed)
Comprehensive Clinical Assessment (CCA) Note  07/26/2021 Carlos Flowers [id number]  Disposition: Per Carlos Pope, MD, patient is recommended for treatment at the [facility] unit.   Flowsheet Row Ad[facility] (Dis[facility]) [facility] from 07/13/2021 in [facility] [place] [facility] ED from 04/13/2021 in [facility]  C-SSRS RISK CATEGORY High Risk High Risk Error: Q7 should not be populated when Q6 is No      The patient demonstrates the following risk factors for suicide: Chronic risk factors for suicide include: psychiatric disorder of MDD and substance use disorder. Acute risk factors for suicide include: unemployment and recent discharge from inpatient psychiatry. Protective factors for this patient include: religious beliefs against suicide. Considering these factors, the overall suicide risk at this point appears to be moderate. Patient is not appropriate for outpatient follow up.   Carlos Flowers is a 40 year old male presenting to [facility] reporting SI with a plan to cut his wrist. Patient reports he was discharged from [facility] today and transported to [facility]. Patient stated that he didn't feel stable to stay at [facility] and continues to endorse suicidal thoughts, depression and anxiety so [facility] drove him here. Patient reports he has been depressed for some time, however due to recent stressors involving him being homeless and loss of a relationships his depression has worsened. Patient reports that his medications were changed while at [facility] however he reports that he did not attend groups due to the severity of his depression. Patient reports that while at [facility] they were encouraging him to participate in groups that he did not want to attend due to severity of depression. Patient also reports that he cannot smoke cigarettes at [facility].  Patient reports diagnosis of depression and denies having outpatient  services before entering inpatient treatment. Patient reports a "handful" of inpatient visits and has a history of rehab treatment due to alcohol abuse. Patient reports family history of mental illness on his mother side. Patient also denies having supports in the area.  Patient reports being homeless for several months. Patient denies having legal issues and is not employed or receiving disability at this time.   Patient reports SI with a plan and denies HI, AVH and recent substance use. Patient has never attempted suicide and reports protective factor of his faith in God and not wanting to go to [facility]. Patient wants to know if he could stay 24 hours at [facility] for observation and help with getting into another inpatient treatment facility. After processing with MD patient contracts for safety and is willing to go back to [facility] if they will allow him. See MD note for full details.     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     CCA Screening, Triage and Referral (STR)  Patient Reported Information How did you hear about [place]? Hospital Discharge  What Is the Reason for Your Visit/Call Today? SI with plan, depression and anxiety  How Long Has This Been Causing You Problems? 1-6 months  What Do You Feel Would Help You the Most Today? Treatment for Depression or other mood problem   Have You Recently Had Any Thoughts About Hurting Yourself? Yes  Are You Planning to Commit Suicide/Harm Yourself At This time? Yes   Have you Recently Had Thoughts About Hurting Someone [name]? No  Are You Planning to Harm Someone  at This Time? No  Explanation: No data recorded  Have You Used Any Alcohol or Drugs in the Past 24 Hours? No  How Long Ago Did You Use Drugs or Alcohol? 0000 (Last night, exact time unknown)  What Did You Use and How Much? Two tall cans.   Do You Currently  Have a Therapist/Psychiatrist? No  Name of Therapist/Psychiatrist: No data recorded  Have You Been Recently Discharged From Any Office Practice or Programs? No  Explanation of Discharge From Practice/Program: No data recorded    CCA Screening Triage Referral Assessment Type of Contact: Face-to-Face  Telemedicine Service Delivery:   Is this Initial or Reassessment? Initial Assessment  Date Telepsych consult ordered in [facility]:  03/21/21  Time Telepsych consult ordered in [facility]:  0656  Location of Assessment: [facility] ED  Provider Location: Other (comment) ([facility])   Collateral Involvement: None at this time   Does Patient Have a Court Appointed Legal Guardian? No data recorded Name and Contact of Legal Guardian: No data recorded If Minor and Not Living with Parent(s), Who has Custody? NA  Is CPS involved or ever been involved? Never  Is APS involved or ever been involved? Never   Patient Determined To Be At Risk for Harm To Self or Others Based on Review of Patient Reported Information or Presenting Complaint? Yes, for Self-Harm  Method: No data recorded Availability of Means: No data recorded Intent: No data recorded Notification Required: No data recorded Additional Information for Danger to Others Potential: No data recorded Additional Comments for Danger to Others Potential: No data recorded Are There Guns or Other Weapons in Your Home? No data recorded Types of Guns/Weapons: No data recorded Are These Weapons Safely Secured?                            No data recorded Who Could Verify You Are Able To Have These Secured: No data recorded Do You Have any Outstanding Charges, Pending Court Dates, Parole/Probation? No data recorded Contacted To Inform of Risk of Harm To Self or Others: Other: Comment (NA)    Does Patient Present under Involuntary Commitment? No  IVC Papers Initial File Date: No data recorded  [place] of Residence: [place]   Patient Currently Receiving  the Following Services: Not Receiving Services   Determination of Need: Urgent (48 hours)   Options For Referral: Medication Management; Outpatient Therapy     CCA Biopsychosocial Patient Reported Schizophrenia/Schizoaffective Diagnosis in Past: No   Strengths: Not assessed.   Mental Health Symptoms Depression:   Sleep (too much or little); Worthlessness; Hopelessness; Fatigue; Increase/decrease in appetite; Irritability; Tearfulness; Difficulty Concentrating (Isolation, guilt.)   Duration of Depressive symptoms:    Mania:   None   Anxiety:    Worrying (Panic attacks.)   Psychosis:   None   Duration of Psychotic symptoms:    Trauma:   None   Obsessions:   None   Compulsions:   None   Inattention:   Disorganized; Forgetful; Loses things   Hyperactivity/Impulsivity:   Feeling of restlessness; Fidgets with hands/feet   Oppositional/Defiant Behaviors:   Easily annoyed   Emotional Irregularity:   None   Other Mood/Personality Symptoms:  No data recorded   Mental Status Exam Appearance and self-care  Stature:   Average   Weight:   Average weight   Clothing:   Casual   Grooming:   Normal   Cosmetic use:   None   Posture/gait:  Normal   Motor activity:   Not Remarkable   Sensorium  Attention:   Normal   Concentration:   Normal   Orientation:   X5   Recall/memory:   Normal   Affect and Mood  Affect:   Congruent   Mood:   Depressed; Hopeless; Worthless   Relating  Eye contact:   Normal   Facial expression:   Depressed   Attitude toward examiner:   Cooperative   Thought and Language  Speech flow:  Normal   Thought content:   Appropriate to Mood and Circumstances   Preoccupation:   None   Hallucinations:   None   Organization:  No data recorded  [employer] of Knowledge:   Fair   Intelligence:   Average   Abstraction:   Normal   Judgement:   Poor   Reality Testing:   Distorted    Insight:   Fair   Decision Making:   Impulsive   Social Functioning  Social Maturity:   Impulsive   Social Judgement:   Heedless   Stress  Stressors:   Housing   Coping Ability:   Overwhelmed   Skill Deficits:   Decision making   Supports:   Friends/Service system     Religion:    Leisure/Recreation:    Exercise/Diet:     CCA Employment/Education Employment/Work Situation:    Education:     CCA Family/Childhood History Family and Relationship History:    Childhood History:     Child/Adolescent Assessment:     CCA Substance Use Alcohol/Drug Use:                           ASAM's:  Six Dimensions of Multidimensional Assessment  Dimension 1:  Acute Intoxication and/or Withdrawal Potential:      Dimension 2:  Biomedical Conditions and Complications:      Dimension 3:  Emotional, Behavioral, or Cognitive Conditions and Complications:     Dimension 4:  Readiness to Change:     Dimension 5:  Relapse, Continued use, or Continued Problem Potential:     Dimension 6:  Recovery/Living Environment:     ASAM Severity Score:    ASAM Recommended Level of Treatment:     Substance use Disorder (SUD)    Recommendations for Services/Supports/Treatments:    Discharge Disposition:    DSM5 Diagnoses: Patient Active Problem List   Diagnosis Date Noted   Alcohol abuse 07/17/2021   MDD (major depressive disorder), recurrent episode, severe (HCC) 07/15/2021   MDD (major depressive disorder), recurrent severe, without psychosis (HCC) 03/22/2021   Suicidal ideation 03/21/2021   Alcohol-induced mood disorder (HCC)      Referrals to Alternative Service(s): Referred to Alternative Service(s):   Place:   Date:   Time:    Referred to Alternative Service(s):   Place:   Date:   Time:    Referred to Alternative Service(s):   Place:   Date:   Time:    Referred to Alternative Service(s):   Place:   Date:   Time:     Audree Camel,  [facility]

## 2021-07-26 NOTE — ED Notes (Signed)
Carlos Flowers c/o feeling anxious and having increased anxiety pt reports it has been more than 3 days since he has had any alcoholic beverages and he was starting to feel shaky. Provider Erlene Quan NP contacted and order for an additional dose of hydroxyzine and it is ok to change nicotine patch to nicotine gum

## 2021-07-26 NOTE — Plan of Care (Signed)
  Problem: Education: Goal: Mental status will improve Outcome: Not Progressing   Problem: Activity: Goal: Interest or engagement in activities will improve Outcome: Not Progressing Goal: Sleeping patterns will improve Outcome: Not Progressing   

## 2021-07-27 DIAGNOSIS — R45851 Suicidal ideations: Secondary | ICD-10-CM | POA: Diagnosis not present

## 2021-07-27 DIAGNOSIS — Z20822 Contact with and (suspected) exposure to covid-19: Secondary | ICD-10-CM | POA: Diagnosis not present

## 2021-07-27 DIAGNOSIS — Z765 Malingerer [conscious simulation]: Secondary | ICD-10-CM

## 2021-07-27 DIAGNOSIS — F331 Major depressive disorder, recurrent, moderate: Secondary | ICD-10-CM | POA: Diagnosis not present

## 2021-07-27 DIAGNOSIS — F17200 Nicotine dependence, unspecified, uncomplicated: Secondary | ICD-10-CM | POA: Diagnosis present

## 2021-07-27 DIAGNOSIS — F1021 Alcohol dependence, in remission: Secondary | ICD-10-CM | POA: Diagnosis not present

## 2021-07-27 DIAGNOSIS — Z5900 Homelessness unspecified: Secondary | ICD-10-CM

## 2021-07-27 MED ORDER — LUBRIDERM DAILY MOISTURE EX LOTN
TOPICAL_LOTION | CUTANEOUS | Status: DC | PRN
  Filled 2021-07-27 (×2): qty 177

## 2021-07-27 MED ORDER — NICOTINE POLACRILEX 2 MG MT GUM
CHEWING_GUM | OROMUCOSAL | Status: AC
  Administered 2021-07-27: 2 mg via ORAL
  Filled 2021-07-27: qty 1

## 2021-07-27 MED ORDER — BUSPIRONE HCL 5 MG PO TABS
10.0000 mg | ORAL_TABLET | Freq: Three times a day (TID) | ORAL | Status: DC
Start: 2021-07-27 — End: 2021-07-28
  Administered 2021-07-27 – 2021-07-28 (×5): 10 mg via ORAL
  Filled 2021-07-27 (×5): qty 2

## 2021-07-27 MED ORDER — HYDROXYZINE HCL 25 MG PO TABS
50.0000 mg | ORAL_TABLET | Freq: Four times a day (QID) | ORAL | Status: DC | PRN
  Administered 2021-07-27: 50 mg via ORAL
  Filled 2021-07-27: qty 2

## 2021-07-27 MED ORDER — ADULT MULTIVITAMIN W/MINERALS CH
1.0000 | ORAL_TABLET | Freq: Every morning | ORAL | Status: DC
  Administered 2021-07-27 – 2021-07-28 (×2): 1 via ORAL
  Filled 2021-07-27: qty 1

## 2021-07-27 MED ORDER — NICOTINE POLACRILEX 2 MG MT GUM
2.0000 mg | CHEWING_GUM | OROMUCOSAL | Status: DC | PRN
  Administered 2021-07-27 – 2021-07-28 (×8): 2 mg via ORAL
  Filled 2021-07-27 (×8): qty 1

## 2021-07-27 MED ORDER — TRAZODONE HCL 50 MG PO TABS: 50.0000 mg | ORAL_TABLET | Freq: Every evening | ORAL | Status: DC | PRN

## 2021-07-27 MED ORDER — FLUOXETINE HCL 20 MG PO CAPS
40.0000 mg | ORAL_CAPSULE | Freq: Every day | ORAL | Status: DC
  Administered 2021-07-27 – 2021-07-28 (×2): 40 mg via ORAL
  Filled 2021-07-27 (×2): qty 2

## 2021-07-27 MED ORDER — THIAMINE HCL 100 MG PO TABS
100.0000 mg | ORAL_TABLET | Freq: Every day | ORAL | Status: DC
  Administered 2021-07-27 – 2021-07-28 (×2): 100 mg via ORAL
  Filled 2021-07-27 (×2): qty 1

## 2021-07-27 MED ORDER — QUETIAPINE FUMARATE 100 MG PO TABS
100.0000 mg | ORAL_TABLET | Freq: Every day | ORAL | Status: DC
  Administered 2021-07-27: 100 mg via ORAL
  Filled 2021-07-27: qty 1

## 2021-07-27 MED ORDER — GABAPENTIN 300 MG PO CAPS
300.0000 mg | ORAL_CAPSULE | Freq: Three times a day (TID) | ORAL | Status: DC
  Administered 2021-07-27 – 2021-07-28 (×5): 300 mg via ORAL
  Filled 2021-07-27 (×6): qty 1

## 2021-07-27 NOTE — ED Provider Notes (Signed)
[facility] Admission Suicide Risk Assessment   Nursing information obtained from:   chart Current Mental Status:   passive SI, no plan or intent  Demographic Factors:  Male, Caucasian, Low socioeconomic status, and Unemployed  Loss Factors: homelessness  Historical Factors: Family history of mental illness or substance abuse  Risk Reduction Factors:   Religious beliefs about death   Total Time spent with patient: 30 minutes Principal Problem: Depression Diagnosis:  Principal Problem:   Depression Active Problems:   Malingering   Tobacco use disorder   Homelessness  Subjective Data:  Per H&P Patient is a 40 year old male with history of alcohol use disorder, tobacco use disorder, major depressive disorder, anxiety, and recent [facility] admission discharged 07/26/2021.  Patient presented to [facility] with complaints of suicidal ideation with plan stating that he plan to cut his own wrists.  Patient was originally sent to [facility] following discharge from [facility] but patient stated that "he was not ready for this transition".  Patient stated that Headlee had "a couple more days" he would feel more ready to go to [facility] or "wherever".  When he has consistently what patient is looking for in terms of treatment, patient states that he is unsure and states that he has been unable to engage with treatment at pediatric such as group therapy because he has been "so depressed".  Patient states that he felt that his medications were insufficient in terms of aiding with his depression.  Patient states that he is frustrated that he is unable to get the help he needs.  Patient does admit that he has been having trouble coming to grips with reality because of the fact that he has been drinking alcohol and using substances for so long.  This Clinical research associate encouraged patient to focus on rehabilitation and making steps towards having a meaningful and productive life.  Patient was encouraged to attend group therapy while at the [facility]  and was agreeable to attend.  Patient understands about he needs to make steps towards becoming more independent and more active in his treatment.   Patient denies HI AVH at this time.  Patient states that he resumed tobacco use immediately after discharge.  Patient denies alcohol use since the 14th.  When specifically asking about patient's suicidal thoughts and plan to "cut his own wrist" patient states that he would never do anything suicidal as he "does not want to go to hell for killing himself".  Patient was last here he was safe to go to a homeless shelter until [facility] appointment on Monday, but patient states that he would likely drink alcohol and continue to have suicidal thoughts.   Per Buck Mam, patient was found to be smoking in the bathrooms as well as outside [facility].  Patient continues to smoke even after being told by [facility] staff at [facility] that he was not allowed to smoke while at [facility].  When this writer confronted patient about this he stated that he was simply told to smoke at the end of the driveway and was uncertain why he had been unable to go back to [facility].  Patient is presently scheduled for [facility] on Monday but will have social work aid in dispo for this patient should there be an earlier solution.  Patient agreeable with this plan at this time.  Today,  Patient is calm and cooperative; he does not appear in any acute distress. He is sitting in the cafe area drinking coffee and watching TV. Pt is amenable to go  to his room to speak for privacy. He provides information as per H&P. He states that "I didn't want to go to [facility] because I can't smoke". Patient does not volunteer that he was feeling suicidal but when asked about reporting this, patient indicates that he did feel suicidal because he did not want to go to [facility] and stated that this was communicated to his doctors at [facility]. Patient requests to go to a substance use treatment facility that allows smoking and  says that [facility] is a smoking facility. Discussed with patient that a referral can be made but that it is dependent upon bed availability. Pt verbalized understanding and inquired about other options. Pt states that he does not want to go to a shelter and indicates that he would be "miserable" there.  He describes experiencing passive SI with no plan or intent. No HI/AVH. He reports sleeping poorly last night and reports feeling anxious. Pt states if he can have medications to assist with "Alcohol withdrawal" and requests "ativan or librium". Pt affirms that he has not had a drink since 8/14 and discussed that based on the timeline, VS, and objective presentation that there is no concern for alcohol withdrawal currently. Discussed that vistaril is available and encouraged patient to use coping skills. Encouraged patient to attend groups   Continued Clinical Symptoms:    The "Alcohol Use Disorders Identification Test", Guidelines for Use in Primary Care, Second Edition.  World Science writer [facility]). Score between 0-7:  no or low risk or alcohol related problems. Score between 8-15:  moderate risk of alcohol related problems. Score between 16-19:  high risk of alcohol related problems. Score 20 or above:  warrants further diagnostic evaluation for alcohol dependence and treatment.   CLINICAL FACTORS:   Depression:   Hopelessness Alcohol/Substance Abuse/Dependencies   Musculoskeletal: Strength & Muscle Tone: within normal limits Gait & Station: normal Patient leans: N/A  Psychiatric Specialty Exam:  Presentation  General Appearance: Appropriate for Environment; Casual  Eye Contact:Good  Speech:Clear and Coherent; Normal Rate  Speech Volume:Normal  Handedness:Right   Mood and Affect  Mood:Dysphoric  Affect:Appropriate; Congruent   Thought Process  Thought Processes:Coherent; Goal Directed; Linear  Descriptions of Associations:Intact  Orientation:Full (Time, Place and  Person)  Thought Content:WDL; Logical  History of Schizophrenia/Schizoaffective disorder:No  Duration of Psychotic Symptoms:No data recorded Hallucinations:Hallucinations: Other (comment)  Ideas of Reference:None  Suicidal Thoughts:Suicidal Thoughts: Yes, Passive SI Passive Intent and/or Plan: Without Intent; Without Plan  Homicidal Thoughts:Homicidal Thoughts: No   Sensorium  Memory:Immediate Good; Recent Good; Remote Good  Judgment:Fair  Insight:Lacking   Executive Functions  Concentration:Fair  Attention Span:Fair  Recall:Fair  Fund of Knowledge:Fair  Language:Good   Psychomotor Activity  Psychomotor Activity:Psychomotor Activity: Normal   Assets  Assets:Communication Skills; Resilience   Sleep  Sleep:Sleep: Poor    Physical Exam: Physical Exam Constitutional:      Appearance: Normal appearance. He is normal weight.  HENT:     Head: Normocephalic and atraumatic.  Eyes:     Extraocular Movements: Extraocular movements intact.  Pulmonary:     Effort: Pulmonary effort is normal.  Neurological:     General: No focal deficit present.     Mental Status: He is alert.   Review of Systems  Constitutional:  Negative for chills and fever.  HENT:  Negative for hearing loss.   Eyes:  Negative for discharge and redness.  Respiratory:  Negative for cough.   Cardiovascular:  Negative for chest pain.  Gastrointestinal:  Negative for  abdominal pain.  Musculoskeletal:  Negative for myalgias.  Neurological:  Negative for headaches.  Psychiatric/Behavioral:  Positive for depression and substance abuse. Negative for hallucinations. The patient is nervous/anxious and has insomnia.   Blood pressure 116/90, pulse 84, temperature 97.8 F (36.6 C), temperature source Oral, resp. rate 18, SpO2 98 %. There is no height or weight on file to calculate BMI.   COGNITIVE FEATURES THAT CONTRIBUTE TO RISK:  Closed-mindedness    SUICIDE RISK:   Minimal: No identifiable  suicidal ideation.  Patients presenting with no risk factors but with morbid ruminations; may be classified as minimal risk based on the severity of the depressive symptoms  PLAN OF CARE:  40 yo male with a history of depression, alcohol use disorder (last drink 8/14) who presented to the [facility] from [facility] for SI after discharged from [facility] earlier in the day. Pt indicates that he did not want to go to [facility] due to it being a no smoking facility.  Patient was unable to contract for safety and was admitted to the [facility] for safety. Home medications were restarted. Labs reviewed- UDS negative, CMP, TSH unremarkable, CBC with mildly elevated WBC at 14.2; VSS. There is high suspicion for malingering given presentation and for medication seeking behavior as he is requesting controlled substances for "alcohol withdrawal" despite last drink being 12 days ago, VSS and no objective concerns for withdrawal. Per chart review, it does not appear that he received any PRNs for alcohol withdrawal sx during admission at [facility]. Patient is unable to contract for safety at this time and is appropriate for [facility].  I certify that inpatient services furnished can reasonably be expected to improve the patient's condition.   Estella Husk, MD 07/27/2021, 10:17 AM

## 2021-07-27 NOTE — Progress Notes (Signed)
Patient alert and oriented x 4. Patient was admitted to [facility] on 07/15/2021 discharged 07/26/2021 with the plan to follow up with [facility]. Although patient was has been hospitalized for 12 days inpatient treatment, patient is asking for alcohol detox medications. No visible signs of  tremors or sweating or shakiness seen by RN. RN explained to patient he received anxiety medication at 0630 and could not have anymore vistaril until 1230 pm. Patient did received Buspar with other daily medications. Patient asked for gabapentin at this time order not approved by pharmacy. Patient will receive when approved. Patient asked for nicotine gum as well which was given to patient. Patient  gum  placed in hand. Nursing staff will continue to monitor

## 2021-07-27 NOTE — Group Therapy Note (Signed)
Patient did not participate in group, even after several prompts from staff.

## 2021-07-27 NOTE — Progress Notes (Signed)
Patient informed several times group was starting, patient did not want to attend but sleep. RN will inform provider and Child psychotherapist.

## 2021-07-27 NOTE — Progress Notes (Signed)
Patient received scheduled medications and PRN nicotine gum. Patient woke up to eat meals, not active in groups. Attended one group in which MHT had to repeatedly inform patient the importance of attending groups and participation. RN did inform provider and social worker of patients unwillingness to attend groups only wanting to eat, sleep and take medications. Provider inform RN plan is for discharge tomorrow 07/28/2021.

## 2021-07-27 NOTE — Clinical Social Work Psych Note (Signed)
CSW Update  CSW met with patient for introduction and to being discussion regarding discharge planning. Patient reports he was recently discharged from [facility] to [facility] to participate in their residential substance abuse rehabilitation program. Patient reports he left because he could not smoke cigarettes.    Patient shared with CSW and MD that he would like to be referred to a residential substance abuse rehabilitation program that allows him to smoke cigarettes. CSW and MD informed the patient that most facilities do no allow you to smoke cigarettes, however [facility] is known to allow smoking.   CSW contacted [facility] to determine if beds were available. They report they currently do not have any beds available at this time. They staten that they have a 2 week waiting list they are operating off of currently. CSW will reefer the patient's information to be added to the wait list.   CSW informed patient of this information. Patient expressed understanding.   CSW provided additional resources for homelessness and other assistance origrams in the area. '   CSW will continue to follow.    Radonna Ricker, MSW, LCSW Clinical Education officer, museum ([place]) [facility]

## 2021-07-27 NOTE — ED Provider Notes (Signed)
Behavioral Health Progress Note  Date and Time: 07/27/2021 10:50 AM Name: Carlos Flowers MRN:  [id number]  Subjective:    Patient is calm and cooperative; he does not appear in any acute distress. He is sitting in the cafe area drinking coffee and watching TV. Pt is amenable to go to his room to speak for privacy. He provides information as per H&P. He states that "[facility] because [facility] can't [facility]". Patient does not volunteer that he was feeling suicidal but when asked about reporting this, patient indicates that he did feel suicidal because he did not want to [facility] to [facility] and stated that this [facility]. Patient requests to go to a substance use treatment facility that allows smoking and says that [facility] is a smoking facility. Discussed with patient that a referral can be made but that it is dependent upon bed availability. Pt verbalized understanding and inquired about other options. Pt states that he does not want to go to a shelter and indicates that he would be "miserable" there.  He describes experiencing passive SI with no plan or intent. No HI/AVH. He reports sleeping poorly last night and reports feeling anxious. Pt states if he can have medications to assist with "Alcohol withdrawal" and requests "ativan or librium". Pt affirms that he has not had a drink since 8/14 and discussed that based on the timeline, VS, and objective presentation that there is no concern for alcohol withdrawal currently. Discussed that vistaril is available and encouraged patient to use coping skills. Encouraged patient to attend groups   Diagnosis:  Final diagnoses:  Moderate episode of recurrent major depressive disorder (HCC)  Suicidal ideation  Alcohol use disorder, moderate, in early remission (HCC)  Tobacco use disorder  Malingering  Homelessness    Total Time spent with patient: 20 minutes  Past Psychiatric History: see H&P Past Medical History:  Past  Medical History:  Diagnosis Date   Alcoholism (HCC)    Anxiety    Depression    Substance abuse (HCC)    No past surgical history on file. Family History:  Family History  Problem Relation Age of Onset   Cancer Maternal Grandmother    Heart disease Paternal Grandmother    Heart disease Paternal Grandfather    Family Psychiatric  History: See H&P Social History:  Social History   Substance and Sexual Activity  Alcohol Use Yes   Alcohol/week: 168.0 standard drinks   Types: 168 Cans of beer per week     Social History   Substance and Sexual Activity  Drug Use Not Currently   Types: Cocaine, Heroin, Methamphetamines   Comment: Last used methamphetamines "several months ago"    Social History   Socioeconomic History   Marital status: Single    Spouse name: Not on file   Number of children: Not on file   Years of education: Not on file   Highest education level: Not on file  Occupational History   Not on file  Tobacco Use   Smoking status: Every Day    Packs/day: 1.00    Years: 17.00    Pack years: 17.00    Types: Cigarettes   Smokeless tobacco: Never  Vaping Use   Vaping Use: Never used  Substance and Sexual Activity   Alcohol use: Yes    Alcohol/week: 168.0 standard drinks    Types: 168 Cans of beer per week   Drug use: Not Currently  Types: Cocaine, Heroin, Methamphetamines    Comment: Last used methamphetamines "several months ago"   Sexual activity: Not Currently  Other Topics Concern   Not on file  Social History Narrative   Not on file   Social Determinants of Health   Financial Resource Strain: Not on file  Food Insecurity: Not on file  Transportation Needs: Not on file  Physical Activity: Not on file  Stress: Not on file  Social Connections: Not on file   SDOH:  SDOH Screenings   Alcohol Screen: Medium Risk   Last Alcohol Screening Score (AUDIT): 35  Depression (PHQ2-9): Medium Risk   PHQ-2 Score: 18  Financial Resource Strain: Not on  file  Food Insecurity: Not on file  Housing: Not on file  Physical Activity: Not on file  Social Connections: Not on file  Stress: Not on file  Tobacco Use: High Risk   Smoking Tobacco Use: Every Day   Smokeless Tobacco Use: Never  Transportation Needs: Not on file   Additional Social History:                         Sleep: Poor  Appetite:  Fair  Current Medications:  Current Facility-Administered Medications  Medication Dose Route Frequency Provider Last Rate Last Admin   acetaminophen (TYLENOL) tablet 650 mg  650 mg Oral Q6H PRN Park Pope, MD       alum & mag hydroxide-simeth (MAALOX/MYLANTA) 200-200-20 MG/5ML suspension 30 mL  30 mL Oral Q4H PRN Park Pope, MD       busPIRone (BUSPAR) tablet 10 mg  10 mg Oral TID Estella Husk, MD   10 mg at 07/27/21 0827   FLUoxetine (PROZAC) capsule 40 mg  40 mg Oral Daily Estella Husk, MD   40 mg at 07/27/21 0827   gabapentin (NEURONTIN) capsule 300 mg  300 mg Oral TID Estella Husk, MD   300 mg at 07/27/21 1610   hydrOXYzine (ATARAX/VISTARIL) tablet 25 mg  25 mg Oral TID PRN Park Pope, MD   25 mg at 07/27/21 9604   hydrOXYzine (ATARAX/VISTARIL) tablet 50 mg  50 mg Oral Q6H PRN Estella Husk, MD       Lubriderm Daily Moisture LOTN   Topical PRN Estella Husk, MD       magnesium hydroxide (MILK OF MAGNESIA) suspension 30 mL  30 mL Oral Daily PRN Park Pope, MD       multivitamin with minerals tablet 1 tablet  1 tablet Oral q morning Estella Husk, MD   1 tablet at 07/27/21 0831   nicotine polacrilex (NICORETTE) gum 2 mg  2 mg Oral PRN Nira Conn A, NP   2 mg at 07/27/21 0831   QUEtiapine (SEROQUEL) tablet 100 mg  100 mg Oral QHS Estella Husk, MD       thiamine tablet 100 mg  100 mg Oral Daily Estella Husk, MD   100 mg at 07/27/21 0831   traZODone (DESYREL) tablet 50 mg  50 mg Oral QHS PRN Estella Husk, MD       Current Outpatient Medications  Medication Sig  Dispense Refill   busPIRone (BUSPAR) 10 MG tablet Take 1 tablet (10 mg total) by mouth 3 (three) times daily. For anxiety 90 tablet 0   cetaphil (CETAPHIL) lotion Apply topically as needed for dry skin. (Provide pt with the remaining lotion). 236 mL 0   FLUoxetine (PROZAC) 40 MG capsule Take 1 capsule (  40 mg total) by mouth daily. For depression 30 capsule 0   gabapentin (NEURONTIN) 300 MG capsule Take 1 capsule (300 mg total) by mouth 3 (three) times daily. For alcohol withdrawal syndrome 90 capsule 0   hydrOXYzine (ATARAX/VISTARIL) 50 MG tablet Take 1 tablet (50 mg total) by mouth every 6 (six) hours as needed for anxiety. 75 tablet 0   Multiple Vitamin (MULTIVITAMIN WITH MINERALS) TABS tablet Take 1 tablet by mouth every morning.     nicotine polacrilex (NICORETTE) 2 MG gum Take 1 each (2 mg total) by mouth as needed. (May buy from over the counter): For smoking cessation 100 tablet 0   QUEtiapine (SEROQUEL) 100 MG tablet Take 1 tablet (100 mg total) by mouth at bedtime. For mood control 30 tablet 0   thiamine 100 MG tablet Take 1 tablet (100 mg total) by mouth daily. For low thiamine 30 tablet 0   traZODone (DESYREL) 50 MG tablet Take 1 tablet (50 mg total) by mouth at bedtime as needed for sleep. 30 tablet 0    Labs  Lab Results:  Admission on 07/26/2021  Component Date Value Ref Range Status   SARS Coronavirus 2 by RT PCR 07/26/2021 NEGATIVE  NEGATIVE Final   Comment: (NOTE) SARS-CoV-2 target nucleic acids are NOT DETECTED.  The SARS-CoV-2 RNA is generally detectable in upper respiratory specimens during the acute phase of infection. The lowest concentration of SARS-CoV-2 viral copies this assay can detect is 138 copies/mL. A negative result does not preclude SARS-Cov-2 infection and should not be used as the sole basis for treatment or other patient management decisions. A negative result may occur with  improper specimen collection/handling, submission of specimen other than  nasopharyngeal swab, presence of viral mutation(s) within the areas targeted by this assay, and inadequate number of viral copies(<138 copies/mL). A negative result must be combined with clinical observations, patient history, and epidemiological information. The expected result is Negative.  Fact Sheet for Patients:  BloggerCourse.com  Fact Sheet for Healthcare Providers:  SeriousBroker.it  This test is no                          t yet approved or cleared by the [place] FDA and  has been authorized for detection and/or diagnosis of SARS-CoV-2 by FDA under an Emergency Use Authorization (EUA). This EUA will remain  in effect (meaning this test can be used) for the duration of the COVID-19 declaration under Section 564(b)(1) of the Act, 21 U.S.C.section 360bbb-3(b)(1), unless the authorization is terminated  or revoked sooner.       Influenza A by PCR 07/26/2021 NEGATIVE  NEGATIVE Final   Influenza B by PCR 07/26/2021 NEGATIVE  NEGATIVE Final   Comment: (NOTE) The Xpert Xpress SARS-CoV-2/FLU/RSV plus assay is intended as an aid in the diagnosis of influenza from Nasopharyngeal swab specimens and should not be used as a sole basis for treatment. Nasal washings and aspirates are unacceptable for Xpert Xpress SARS-CoV-2/FLU/RSV testing.  Fact Sheet for Patients: BloggerCourse.com  Fact Sheet for Healthcare Providers: SeriousBroker.it  This test is not yet approved or cleared by the [place] FDA and has been authorized for detection and/or diagnosis of SARS-CoV-2 by FDA under an Emergency Use Authorization (EUA). This EUA will remain in effect (meaning this test can be used) for the duration of the COVID-19 declaration under Section 564(b)(1) of the Act, 21 U.S.C. section 360bbb-3(b)(1), unless the authorization is terminated or revoked.  Performed at [facility], [address], [place], [place] [address]    WBC 07/26/2021 14.2 (A) 4.0 - 10.5 K/uL Final   RBC 07/26/2021 5.25  4.22 - 5.81 MIL/uL Final   Hemoglobin 07/26/2021 15.6  13.0 - 17.0 g/dL Final   HCT 60/45/4098 46.9  39.0 - 52.0 % Final   MCV 07/26/2021 89.3  80.0 - 100.0 fL Final   MCH 07/26/2021 29.7  26.0 - 34.0 pg Final   MCHC 07/26/2021 33.3  30.0 - 36.0 g/dL Final   RDW 11/91/4782 13.3  11.5 - 15.5 % Final   Platelets 07/26/2021 281  150 - 400 K/uL Final   nRBC 07/26/2021 0.0  0.0 - 0.2 % Final   Neutrophils Relative % 07/26/2021 69  % Final   Neutro Abs 07/26/2021 9.8 (A) 1.7 - 7.7 K/uL Final   Lymphocytes Relative 07/26/2021 21  % Final   Lymphs Abs 07/26/2021 3.0  0.7 - 4.0 K/uL Final   Monocytes Relative 07/26/2021 7  % Final   Monocytes Absolute 07/26/2021 0.9  0.1 - 1.0 K/uL Final   Eosinophils Relative 07/26/2021 2  % Final   Eosinophils Absolute 07/26/2021 0.3  0.0 - 0.5 K/uL Final   Basophils Relative 07/26/2021 1  % Final   Basophils Absolute 07/26/2021 0.1  0.0 - 0.1 K/uL Final   Immature Granulocytes 07/26/2021 0  % Final   Abs Immature Granulocytes 07/26/2021 0.06  0.00 - 0.07 K/uL Final   Performed at [facility], [address], [place], [place] [address]   Sodium 07/26/2021 138  135 - 145 mmol/L Final   Potassium 07/26/2021 4.0  3.5 - 5.1 mmol/L Final   Chloride 07/26/2021 103  98 - 111 mmol/L Final   CO2 07/26/2021 26  22 - 32 mmol/L Final   Glucose, Bld 07/26/2021 87  70 - 99 mg/dL Final   Glucose reference range applies only to samples taken after fasting for at least 8 hours.   BUN 07/26/2021 10  6 - 20 mg/dL Final   Creatinine, Ser 07/26/2021 0.88  0.61 - 1.24 mg/dL Final   Calcium 30/86/5784 9.5  8.9 - 10.3 mg/dL Final   Total Protein 69/62/9528 7.8  6.5 - 8.1 g/dL Final   Albumin 41/32/4401 4.7  3.5 - 5.0 g/dL Final   AST 02/72/5366 36  15 - 41 U/L Final   ALT 07/26/2021 63 (A) 0 - 44 U/L Final   Alkaline Phosphatase 07/26/2021 87  38  - 126 U/L Final   Total Bilirubin 07/26/2021 0.5  0.3 - 1.2 mg/dL Final   GFR, Estimated 07/26/2021 >60  >60 mL/min Final   Comment: (NOTE) Calculated using the CKD-EPI Creatinine Equation (2021)    Anion gap 07/26/2021 9  5 - 15 Final   Performed at [facility], [address] [place], [place] [address]   Hgb A1c MFr Bld 07/26/2021 5.8 (A) 4.8 - 5.6 % Final   Comment: (NOTE) Pre diabetes:          5.7%-6.4%  Diabetes:              >6.4%  Glycemic control for   <7.0% adults with diabetes    Mean Plasma Glucose 07/26/2021 119.76  mg/dL Final   Performed at [facility], [address] [place], [place] [address]   TSH 07/26/2021 1.789  0.350 - 4.500 uIU/mL Final   Comment: Performed by a 3rd Generation assay with a functional sensitivity of <=0.01 uIU/mL. Performed  at [facility], [address], [place], [place] [address]    POC Amphetamine UR 07/26/2021 None Detected  NONE DETECTED (Cut Off Level 1000 ng/mL) Final   POC Secobarbital (BAR) 07/26/2021 None Detected  NONE DETECTED (Cut Off Level 300 ng/mL) Final   POC Buprenorphine (BUP) 07/26/2021 None Detected  NONE DETECTED (Cut Off Level 10 ng/mL) Final   POC Oxazepam (BZO) 07/26/2021 None Detected  NONE DETECTED (Cut Off Level 300 ng/mL) Final   POC Cocaine UR 07/26/2021 None Detected  NONE DETECTED (Cut Off Level 300 ng/mL) Final   POC Methamphetamine UR 07/26/2021 None Detected  NONE DETECTED (Cut Off Level 1000 ng/mL) Final   POC Morphine 07/26/2021 None Detected  NONE DETECTED (Cut Off Level 300 ng/mL) Final   POC Oxycodone UR 07/26/2021 None Detected  NONE DETECTED (Cut Off Level 100 ng/mL) Final   POC Methadone UR 07/26/2021 None Detected  NONE DETECTED (Cut Off Level 300 ng/mL) Final   POC Marijuana UR 07/26/2021 None Detected  NONE DETECTED (Cut Off Level 50 ng/mL) Final   SARS Coronavirus 2 Ag 07/26/2021 Negative  Negative Final   SARSCOV2ONAVIRUS 2 AG 07/26/2021 NEGATIVE  NEGATIVE Final   Comment:  (NOTE) SARS-CoV-2 antigen NOT DETECTED.   Negative results are presumptive.  Negative results do not preclude SARS-CoV-2 infection and should not be used as the sole basis for treatment or other patient management decisions, including infection  control decisions, particularly in the presence of clinical signs and  symptoms consistent with COVID-19, or in those who have been in contact with the virus.  Negative results must be combined with clinical observations, patient history, and epidemiological information. The expected result is Negative.  Fact Sheet for Patients: https://www.jennings-kim.com/  Fact Sheet for Healthcare Providers: https://alexander-rogers.biz/  This test is not yet approved or cleared by the [place] FDA and  has been authorized for detection and/or diagnosis of SARS-CoV-2 by FDA under an Emergency Use Authorization (EUA).  This EUA will remain in effect (meaning this test can be used) for the duration of  the COV                          ID-19 declaration under Section 564(b)(1) of the Act, 21 U.S.C. section 360bbb-3(b)(1), unless the authorization is terminated or revoked sooner.    Admission on 07/15/2021, Discharged on 07/26/2021  Component Date Value Ref Range Status   WBC 07/17/2021 12.7 (A) 4.0 - 10.5 K/uL Final   RBC 07/17/2021 5.08  4.22 - 5.81 MIL/uL Final   Hemoglobin 07/17/2021 15.1  13.0 - 17.0 g/dL Final   HCT 60/45/4098 46.1  39.0 - 52.0 % Final   MCV 07/17/2021 90.7  80.0 - 100.0 fL Final   MCH 07/17/2021 29.7  26.0 - 34.0 pg Final   MCHC 07/17/2021 32.8  30.0 - 36.0 g/dL Final   RDW 11/91/4782 13.2  11.5 - 15.5 % Final   Platelets 07/17/2021 288  150 - 400 K/uL Final   nRBC 07/17/2021 0.0  0.0 - 0.2 % Final   Neutrophils Relative % 07/17/2021 66  % Final   Neutro Abs 07/17/2021 8.3 (A) 1.7 - 7.7 K/uL Final   Lymphocytes Relative 07/17/2021 23  % Final   Lymphs Abs 07/17/2021 3.0  0.7 - 4.0 K/uL Final    Monocytes Relative 07/17/2021 8  % Final   Monocytes Absolute 07/17/2021 1.0  0.1 - 1.0 K/uL Final   Eosinophils Relative 07/17/2021 2  %  Final   Eosinophils Absolute 07/17/2021 0.3  0.0 - 0.5 K/uL Final   Basophils Relative 07/17/2021 1  % Final   Basophils Absolute 07/17/2021 0.1  0.0 - 0.1 K/uL Final   Immature Granulocytes 07/17/2021 0  % Final   Abs Immature Granulocytes 07/17/2021 0.04  0.00 - 0.07 K/uL Final   Performed at [facility], [address], [place], [place] [address]   Hgb A1c MFr Bld 07/17/2021 5.6  4.8 - 5.6 % Final   Comment: (NOTE) Pre diabetes:          5.7%-6.4%  Diabetes:              >6.4%  Glycemic control for   <7.0% adults with diabetes    Mean Plasma Glucose 07/17/2021 114.02  mg/dL Final   Performed at [facility], [address], [place], [place] [address]   Cholesterol 07/17/2021 192  0 - 200 mg/dL Final   Triglycerides 09/81/1914 82  <150 mg/dL Final   HDL 78/29/5621 48  >40 mg/dL Final   Total CHOL/HDL Ratio 07/17/2021 4.0  RATIO Final   VLDL 07/17/2021 16  0 - 40 mg/dL Final   LDL Cholesterol 07/17/2021 128 (A) 0 - 99 mg/dL Final   Comment:        Total Cholesterol/HDL:CHD Risk Coronary Heart Disease Risk Table                     Men   Women  1/2 Average Risk   3.4   3.3  Average Risk       5.0   4.4  2 X Average Risk   9.6   7.1  3 X Average Risk  23.4   11.0        Use the calculated Patient Ratio above and the CHD Risk Table to determine the patient's CHD Risk.        ATP III CLASSIFICATION (LDL):  <100     mg/dL   Optimal  308-657  mg/dL   Near or Above                    Optimal  130-159  mg/dL   Borderline  846-962  mg/dL   High  >952     mg/dL   Very High Performed at [facility], [address] [place], [place] [address]    TSH 07/17/2021 1.063  0.350 - 4.500 uIU/mL Final   Comment: Performed by a 3rd Generation assay with a functional sensitivity of <=0.01 uIU/mL. Performed  at [facility], [address] [place], [place] [address]    WBC 07/21/2021 8.2  4.0 - 10.5 K/uL Final   RBC 07/21/2021 5.36  4.22 - 5.81 MIL/uL Final   Hemoglobin 07/21/2021 15.8  13.0 - 17.0 g/dL Final   HCT 27/25/3664 48.9  39.0 - 52.0 % Final   MCV 07/21/2021 91.2  80.0 - 100.0 fL Final   MCH 07/21/2021 29.5  26.0 - 34.0 pg Final   MCHC 07/21/2021 32.3  30.0 - 36.0 g/dL Final   RDW 40/34/7425 13.4  11.5 - 15.5 % Final   Platelets 07/21/2021 268  150 - 400 K/uL Final   nRBC 07/21/2021 0.0  0.0 - 0.2 % Final   Neutrophils Relative % 07/21/2021 62  % Final   Neutro Abs 07/21/2021 5.1  1.7 - 7.7 K/uL Final   Lymphocytes Relative 07/21/2021 26  % Final   Lymphs  Abs 07/21/2021 2.2  0.7 - 4.0 K/uL Final   Monocytes Relative 07/21/2021 8  % Final   Monocytes Absolute 07/21/2021 0.7  0.1 - 1.0 K/uL Final   Eosinophils Relative 07/21/2021 2  % Final   Eosinophils Absolute 07/21/2021 0.2  0.0 - 0.5 K/uL Final   Basophils Relative 07/21/2021 1  % Final   Basophils Absolute 07/21/2021 0.1  0.0 - 0.1 K/uL Final   Immature Granulocytes 07/21/2021 1  % Final   Abs Immature Granulocytes 07/21/2021 0.04  0.00 - 0.07 K/uL Final   Performed at [facility], [address], [place], [place] [address]   Total Protein 07/21/2021 7.9  6.5 - 8.1 g/dL Final   Albumin 19/62/2297 4.6  3.5 - 5.0 g/dL Final   AST 98/92/1194 23  15 - 41 U/L Final   ALT 07/21/2021 34  0 - 44 U/L Final   Alkaline Phosphatase 07/21/2021 91  38 - 126 U/L Final   Total Bilirubin 07/21/2021 0.5  0.3 - 1.2 mg/dL Final   Bilirubin, Direct 07/21/2021 <0.1  0.0 - 0.2 mg/dL Final   Indirect Bilirubin 07/21/2021 NOT CALCULATED  0.3 - 0.9 mg/dL Final   Performed at [facility], [address], [place], [place] [address]  Admission on 07/13/2021, Discharged on 07/15/2021  Component Date Value Ref Range Status   SARS Coronavirus 2 by RT PCR 07/13/2021 NEGATIVE  NEGATIVE Final    Comment: (NOTE) SARS-CoV-2 target nucleic acids are NOT DETECTED.  The SARS-CoV-2 RNA is generally detectable in upper respiratory specimens during the acute phase of infection. The lowest concentration of SARS-CoV-2 viral copies this assay can detect is 138 copies/mL. A negative result does not preclude SARS-Cov-2 infection and should not be used as the sole basis for treatment or other patient management decisions. A negative result may occur with  improper specimen collection/handling, submission of specimen other than nasopharyngeal swab, presence of viral mutation(s) within the areas targeted by this assay, and inadequate number of viral copies(<138 copies/mL). A negative result must be combined with clinical observations, patient history, and epidemiological information. The expected result is Negative.  Fact Sheet for Patients:  BloggerCourse.com  Fact Sheet for Healthcare Providers:  SeriousBroker.it  This test is no                          t yet approved or cleared by the [place] FDA and  has been authorized for detection and/or diagnosis of SARS-CoV-2 by FDA under an Emergency Use Authorization (EUA). This EUA will remain  in effect (meaning this test can be used) for the duration of the COVID-19 declaration under Section 564(b)(1) of the Act, 21 U.S.C.section 360bbb-3(b)(1), unless the authorization is terminated  or revoked sooner.       Influenza A by PCR 07/13/2021 NEGATIVE  NEGATIVE Final   Influenza B by PCR 07/13/2021 NEGATIVE  NEGATIVE Final   Comment: (NOTE) The Xpert Xpress SARS-CoV-2/FLU/RSV plus assay is intended as an aid in the diagnosis of influenza from Nasopharyngeal swab specimens and should not be used as a sole basis for treatment. Nasal washings and aspirates are unacceptable for Xpert Xpress SARS-CoV-2/FLU/RSV testing.  Fact Sheet for  Patients: BloggerCourse.com  Fact Sheet for Healthcare Providers: SeriousBroker.it  This test is not yet approved or cleared by the [place] FDA and has been authorized for detection and/or diagnosis of SARS-CoV-2 by FDA under an Emergency Use Authorization (EUA). This EUA will remain  in effect (meaning this test can be used) for the duration of the COVID-19 declaration under Section 564(b)(1) of the Act, 21 U.S.C. section 360bbb-3(b)(1), unless the authorization is terminated or revoked.  Performed at [facility], [address], [place], [place] [address]    Sodium 07/13/2021 136  135 - 145 mmol/L Final   Potassium 07/13/2021 4.4  3.5 - 5.1 mmol/L Final   Chloride 07/13/2021 103  98 - 111 mmol/L Final   CO2 07/13/2021 24  22 - 32 mmol/L Final   Glucose, Bld 07/13/2021 96  70 - 99 mg/dL Final   Glucose reference range applies only to samples taken after fasting for at least 8 hours.   BUN 07/13/2021 9  6 - 20 mg/dL Final   Creatinine, Ser 07/13/2021 0.87  0.61 - 1.24 mg/dL Final   Calcium 60/45/4098 9.5  8.9 - 10.3 mg/dL Final   Total Protein 11/91/4782 8.2 (A) 6.5 - 8.1 g/dL Final   Albumin 95/62/1308 4.9  3.5 - 5.0 g/dL Final   AST 65/78/4696 27  15 - 41 U/L Final   ALT 07/13/2021 32  0 - 44 U/L Final   Alkaline Phosphatase 07/13/2021 84  38 - 126 U/L Final   Total Bilirubin 07/13/2021 0.9  0.3 - 1.2 mg/dL Final   GFR, Estimated 07/13/2021 >60  >60 mL/min Final   Comment: (NOTE) Calculated using the CKD-EPI Creatinine Equation (2021)    Anion gap 07/13/2021 9  5 - 15 Final   Performed at [facility], [address] [place], [place] [address]   Alcohol, Ethyl (B) 07/13/2021 <10  <10 mg/dL Final   Comment: (NOTE) Lowest detectable limit for serum alcohol is 10 mg/dL.  For medical purposes only. Performed at [facility], [address], [place], [place]  [address]    Opiates 07/13/2021 NONE DETECTED  NONE DETECTED Final   Cocaine 07/13/2021 NONE DETECTED  NONE DETECTED Final   Benzodiazepines 07/13/2021 POSITIVE (A) NONE DETECTED Final   Amphetamines 07/13/2021 NONE DETECTED  NONE DETECTED Final   Tetrahydrocannabinol 07/13/2021 NONE DETECTED  NONE DETECTED Final   Barbiturates 07/13/2021 NONE DETECTED  NONE DETECTED Final   Comment: (NOTE) DRUG SCREEN FOR MEDICAL PURPOSES ONLY.  IF CONFIRMATION IS NEEDED FOR ANY PURPOSE, NOTIFY LAB WITHIN 5 DAYS.  LOWEST DETECTABLE LIMITS FOR URINE DRUG SCREEN Drug Class                     Cutoff (ng/mL) Amphetamine and metabolites    1000 Barbiturate and metabolites    200 Benzodiazepine                 200 Tricyclics and metabolites     300 Opiates and metabolites        300 Cocaine and metabolites        300 THC                            50 Performed at [facility], [address] [place], [place] [address]    WBC 07/13/2021 10.8 (A) 4.0 - 10.5 K/uL Final   RBC 07/13/2021 5.40  4.22 - 5.81 MIL/uL Final   Hemoglobin 07/13/2021 16.1  13.0 - 17.0 g/dL Final   HCT 72/53/6644 49.5  39.0 - 52.0 % Final   MCV 07/13/2021 91.7  80.0 - 100.0 fL Final   MCH 07/13/2021 29.8  26.0 - 34.0 pg Final  MCHC 07/13/2021 32.5  30.0 - 36.0 g/dL Final   RDW 63/14/9702 13.7  11.5 - 15.5 % Final   Platelets 07/13/2021 273  150 - 400 K/uL Final   nRBC 07/13/2021 0.0  0.0 - 0.2 % Final   Neutrophils Relative % 07/13/2021 72  % Final   Neutro Abs 07/13/2021 7.9 (A) 1.7 - 7.7 K/uL Final   Lymphocytes Relative 07/13/2021 19  % Final   Lymphs Abs 07/13/2021 2.0  0.7 - 4.0 K/uL Final   Monocytes Relative 07/13/2021 6  % Final   Monocytes Absolute 07/13/2021 0.7  0.1 - 1.0 K/uL Final   Eosinophils Relative 07/13/2021 2  % Final   Eosinophils Absolute 07/13/2021 0.2  0.0 - 0.5 K/uL Final   Basophils Relative 07/13/2021 1  % Final   Basophils Absolute 07/13/2021 0.1  0.0 - 0.1 K/uL Final    Immature Granulocytes 07/13/2021 0  % Final   Abs Immature Granulocytes 07/13/2021 0.04  0.00 - 0.07 K/uL Final   Performed at [facility], [address], [place], [place] [address]  Admission on 04/13/2021, Discharged on 04/13/2021  Component Date Value Ref Range Status   SARS Coronavirus 2 by RT PCR 04/13/2021 NEGATIVE  NEGATIVE Final   Comment: (NOTE) SARS-CoV-2 target nucleic acids are NOT DETECTED.  The SARS-CoV-2 RNA is generally detectable in upper respiratory specimens during the acute phase of infection. The lowest concentration of SARS-CoV-2 viral copies this assay can detect is 138 copies/mL. A negative result does not preclude SARS-Cov-2 infection and should not be used as the sole basis for treatment or other patient management decisions. A negative result may occur with  improper specimen collection/handling, submission of specimen other than nasopharyngeal swab, presence of viral mutation(s) within the areas targeted by this assay, and inadequate number of viral copies(<138 copies/mL). A negative result must be combined with clinical observations, patient history, and epidemiological information. The expected result is Negative.  Fact Sheet for Patients:  BloggerCourse.com  Fact Sheet for Healthcare Providers:  SeriousBroker.it  This test is no                          t yet approved or cleared by the [place] FDA and  has been authorized for detection and/or diagnosis of SARS-CoV-2 by FDA under an Emergency Use Authorization (EUA). This EUA will remain  in effect (meaning this test can be used) for the duration of the COVID-19 declaration under Section 564(b)(1) of the Act, 21 U.S.C.section 360bbb-3(b)(1), unless the authorization is terminated  or revoked sooner.       Influenza A by PCR 04/13/2021 NEGATIVE  NEGATIVE Final   Influenza B by PCR 04/13/2021 NEGATIVE  NEGATIVE Final    Comment: (NOTE) The Xpert Xpress SARS-CoV-2/FLU/RSV plus assay is intended as an aid in the diagnosis of influenza from Nasopharyngeal swab specimens and should not be used as a sole basis for treatment. Nasal washings and aspirates are unacceptable for Xpert Xpress SARS-CoV-2/FLU/RSV testing.  Fact Sheet for Patients: BloggerCourse.com  Fact Sheet for Healthcare Providers: SeriousBroker.it  This test is not yet approved or cleared by the [place] FDA and has been authorized for detection and/or diagnosis of SARS-CoV-2 by FDA under an Emergency Use Authorization (EUA). This EUA will remain in effect (meaning this test can be used) for the duration of the COVID-19 declaration under Section 564(b)(1) of the Act, 21 U.S.C. section 360bbb-3(b)(1), unless the authorization is terminated or revoked.  Performed at [facility], [address], [place], [place] [address]    SARS Coronavirus 2 Ag 04/13/2021 Negative  Negative Preliminary   WBC 04/13/2021 15.3 (A) 4.0 - 10.5 K/uL Final   RBC 04/13/2021 4.97  4.22 - 5.81 MIL/uL Final   Hemoglobin 04/13/2021 15.3  13.0 - 17.0 g/dL Final   HCT 60/45/4098 45.9  39.0 - 52.0 % Final   MCV 04/13/2021 92.4  80.0 - 100.0 fL Final   MCH 04/13/2021 30.8  26.0 - 34.0 pg Final   MCHC 04/13/2021 33.3  30.0 - 36.0 g/dL Final   RDW 11/91/4782 13.3  11.5 - 15.5 % Final   Platelets 04/13/2021 290  150 - 400 K/uL Final   nRBC 04/13/2021 0.0  0.0 - 0.2 % Final   Neutrophils Relative % 04/13/2021 65  % Final   Neutro Abs 04/13/2021 10.1 (A) 1.7 - 7.7 K/uL Final   Lymphocytes Relative 04/13/2021 23  % Final   Lymphs Abs 04/13/2021 3.5  0.7 - 4.0 K/uL Final   Monocytes Relative 04/13/2021 10  % Final   Monocytes Absolute 04/13/2021 1.5 (A) 0.1 - 1.0 K/uL Final   Eosinophils Relative 04/13/2021 1  % Final   Eosinophils Absolute 04/13/2021 0.1  0.0 - 0.5 K/uL Final   Basophils Relative 04/13/2021 0   % Final   Basophils Absolute 04/13/2021 0.1  0.0 - 0.1 K/uL Final   Immature Granulocytes 04/13/2021 1  % Final   Abs Immature Granulocytes 04/13/2021 0.08 (A) 0.00 - 0.07 K/uL Final   Performed at [facility], [address] [place], [place] [address]   Sodium 04/13/2021 136  135 - 145 mmol/L Final   Potassium 04/13/2021 4.2  3.5 - 5.1 mmol/L Final   Chloride 04/13/2021 96 (A) 98 - 111 mmol/L Final   CO2 04/13/2021 26  22 - 32 mmol/L Final   Glucose, Bld 04/13/2021 107 (A) 70 - 99 mg/dL Final   Glucose reference range applies only to samples taken after fasting for at least 8 hours.   BUN 04/13/2021 <5 (A) 6 - 20 mg/dL Final   Creatinine, Ser 04/13/2021 1.01  0.61 - 1.24 mg/dL Final   Calcium 30/86/5784 9.5  8.9 - 10.3 mg/dL Final   Total Protein 69/62/9528 7.5  6.5 - 8.1 g/dL Final   Albumin 41/32/4401 4.6  3.5 - 5.0 g/dL Final   AST 02/72/5366 30  15 - 41 U/L Final   ALT 04/13/2021 27  0 - 44 U/L Final   Alkaline Phosphatase 04/13/2021 111  38 - 126 U/L Final   Total Bilirubin 04/13/2021 1.5 (A) 0.3 - 1.2 mg/dL Final   GFR, Estimated 04/13/2021 >60  >60 mL/min Final   Comment: (NOTE) Calculated using the CKD-EPI Creatinine Equation (2021)    Anion gap 04/13/2021 14  5 - 15 Final   Performed at [facility], [address] [place], [place] [address]   Alcohol, Ethyl (B) 04/13/2021 <10  <10 mg/dL Final   Comment: (NOTE) Lowest detectable limit for serum alcohol is 10 mg/dL.  For medical purposes only. Performed at [facility], [address], [place], [place] [address]    POC Amphetamine UR 04/13/2021 None Detected  NONE DETECTED (Cut Off Level 1000 ng/mL) Final   POC Secobarbital (BAR) 04/13/2021 None Detected  NONE DETECTED (Cut Off Level 300 ng/mL) Final   POC Buprenorphine (BUP) 04/13/2021 None Detected  NONE DETECTED (Cut Off Level 10 ng/mL) Final   POC  Oxazepam (BZO) 04/13/2021 Positive (A) NONE DETECTED (Cut Off Level 300 ng/mL) Final   POC Cocaine  UR 04/13/2021 None Detected  NONE DETECTED (Cut Off Level 300 ng/mL) Final   POC Methamphetamine UR 04/13/2021 None Detected  NONE DETECTED (Cut Off Level 1000 ng/mL) Final   POC Morphine 04/13/2021 None Detected  NONE DETECTED (Cut Off Level 300 ng/mL) Final   POC Oxycodone UR 04/13/2021 None Detected  NONE DETECTED (Cut Off Level 100 ng/mL) Final   POC Methadone UR 04/13/2021 None Detected  NONE DETECTED (Cut Off Level 300 ng/mL) Final   POC Marijuana UR 04/13/2021 None Detected  NONE DETECTED (Cut Off Level 50 ng/mL) Final   SARSCOV2ONAVIRUS 2 AG 04/13/2021 NEGATIVE  NEGATIVE Final   Comment: (NOTE) SARS-CoV-2 antigen NOT DETECTED.   Negative results are presumptive.  Negative results do not preclude SARS-CoV-2 infection and should not be used as the sole basis for treatment or other patient management decisions, including infection  control decisions, particularly in the presence of clinical signs and  symptoms consistent with COVID-19, or in those who have been in contact with the virus.  Negative results must be combined with clinical observations, patient history, and epidemiological information. The expected result is Negative.  Fact Sheet for Patients: https://www.jennings-kim.com/  Fact Sheet for Healthcare Providers: https://alexander-rogers.biz/  This test is not yet approved or cleared by the [place] FDA and  has been authorized for detection and/or diagnosis of SARS-CoV-2 by FDA under an Emergency Use Authorization (EUA).  This EUA will remain in effect (meaning this test can be used) for the duration of  the COV                          ID-19 declaration under Section 564(b)(1) of the Act, 21 U.S.C. section 360bbb-3(b)(1), unless the authorization is terminated or revoked sooner.    Admission on 03/22/2021, Discharged on 03/27/2021  Component Date Value Ref Range Status   Cholesterol 03/24/2021 142  0 - 200 mg/dL Final   Triglycerides  03/24/2021 58  <150 mg/dL Final   HDL 81/19/1478 39 (A) >40 mg/dL Final   Total CHOL/HDL Ratio 03/24/2021 3.6  RATIO Final   VLDL 03/24/2021 12  0 - 40 mg/dL Final   LDL Cholesterol 03/24/2021 91  0 - 99 mg/dL Final   Comment:        Total Cholesterol/HDL:CHD Risk Coronary Heart Disease Risk Table                     Men   Women  1/2 Average Risk   3.4   3.3  Average Risk       5.0   4.4  2 X Average Risk   9.6   7.1  3 X Average Risk  23.4   11.0        Use the calculated Patient Ratio above and the CHD Risk Table to determine the patient's CHD Risk.        ATP III CLASSIFICATION (LDL):  <100     mg/dL   Optimal  295-621  mg/dL   Near or Above                    Optimal  130-159  mg/dL   Borderline  308-657  mg/dL   High  >846     mg/dL   Very High Performed at [facility], [address], [place], [place] [address]  TSH 03/24/2021 1.052  0.350 - 4.500 uIU/mL Final   Comment: Performed by a 3rd Generation assay with a functional sensitivity of <=0.01 uIU/mL. Performed at [facility], [address], [place], [place] [address]    Hgb A1c MFr Bld 03/24/2021 5.7 (A) 4.8 - 5.6 % Final   Comment: (NOTE) Pre diabetes:          5.7%-6.4%  Diabetes:              >6.4%  Glycemic control for   <7.0% adults with diabetes    Mean Plasma Glucose 03/24/2021 116.89  mg/dL Final   Performed at [facility], [address] [place], [place] [address]  Admission on 03/21/2021, Discharged on 03/22/2021  Component Date Value Ref Range Status   Sodium 03/21/2021 135  135 - 145 mmol/L Final   Potassium 03/21/2021 4.0  3.5 - 5.1 mmol/L Final   Chloride 03/21/2021 102  98 - 111 mmol/L Final   CO2 03/21/2021 21 (A) 22 - 32 mmol/L Final   Glucose, Bld 03/21/2021 101 (A) 70 - 99 mg/dL Final   Glucose reference range applies only to samples taken after fasting for at least 8 hours.   BUN 03/21/2021 13  6 - 20 mg/dL Final   Creatinine, Ser  03/21/2021 1.02  0.61 - 1.24 mg/dL Final   Calcium 09/81/1914 9.6  8.9 - 10.3 mg/dL Final   Total Protein 78/29/5621 9.2 (A) 6.5 - 8.1 g/dL Final   Albumin 30/86/5784 5.1 (A) 3.5 - 5.0 g/dL Final   AST 69/62/9528 28  15 - 41 U/L Final   ALT 03/21/2021 30  0 - 44 U/L Final   Alkaline Phosphatase 03/21/2021 83  38 - 126 U/L Final   Total Bilirubin 03/21/2021 0.9  0.3 - 1.2 mg/dL Final   GFR, Estimated 03/21/2021 >60  >60 mL/min Final   Comment: (NOTE) Calculated using the CKD-EPI Creatinine Equation (2021)    Anion gap 03/21/2021 12  5 - 15 Final   Performed at [facility], [address] [place], [place] [address]   Alcohol, Ethyl (B) 03/21/2021 <10  <10 mg/dL Final   Comment: (NOTE) Lowest detectable limit for serum alcohol is 10 mg/dL.  For medical purposes only. Performed at [facility], [address] [place], [place] [address]    Salicylate Lvl 03/21/2021 <7.0 (A) 7.0 - 30.0 mg/dL Final   Performed at [facility], [address], [place], [place] [address]   Acetaminophen (Tylenol), Serum 03/21/2021 <10 (A) 10 - 30 ug/mL Final   Comment: (NOTE) Therapeutic concentrations vary significantly. A range of 10-30 ug/mL  may be an effective concentration for many patients. However, some  are best treated at concentrations outside of this range. Acetaminophen concentrations >150 ug/mL at 4 hours after ingestion  and >50 ug/mL at 12 hours after ingestion are often associated with  toxic reactions.  Performed at [facility], [address], [place], [place] [address]    WBC 03/21/2021 8.2  4.0 - 10.5 K/uL Final   RBC 03/21/2021 5.18  4.22 - 5.81 MIL/uL Final   Hemoglobin 03/21/2021 16.3  13.0 - 17.0 g/dL Final   HCT 47/42/5956 49.3  39.0 - 52.0 % Final   MCV 03/21/2021 95.2  80.0 - 100.0 fL Final   MCH 03/21/2021 31.5  26.0 - 34.0 pg Final   MCHC 03/21/2021 33.1  30.0 - 36.0 g/dL Final   RDW  38/75/6433 13.1  11.5 - 15.5 % Final   Platelets 03/21/2021 261  150 - 400 K/uL Final   nRBC 03/21/2021 0.0  0.0 - 0.2 % Final   Performed at [facility], [address], [place], [place] [address]   Opiates 03/21/2021 NONE DETECTED  NONE DETECTED Final   Cocaine 03/21/2021 NONE DETECTED  NONE DETECTED Final   Benzodiazepines 03/21/2021 NONE DETECTED  NONE DETECTED Final   Amphetamines 03/21/2021 NONE DETECTED  NONE DETECTED Final   Tetrahydrocannabinol 03/21/2021 NONE DETECTED  NONE DETECTED Final   Barbiturates 03/21/2021 NONE DETECTED  NONE DETECTED Final   Comment: (NOTE) DRUG SCREEN FOR MEDICAL PURPOSES ONLY.  IF CONFIRMATION IS NEEDED FOR ANY PURPOSE, NOTIFY LAB WITHIN 5 DAYS.  LOWEST DETECTABLE LIMITS FOR URINE DRUG SCREEN Drug Class                     Cutoff (ng/mL) Amphetamine and metabolites    1000 Barbiturate and metabolites    200 Benzodiazepine                 200 Tricyclics and metabolites     300 Opiates and metabolites        300 Cocaine and metabolites        300 THC                            50 Performed at [facility], [address] [place], [place] [address]    SARS Coronavirus 2 by RT PCR 03/21/2021 NEGATIVE  NEGATIVE Final   Comment: (NOTE) SARS-CoV-2 target nucleic acids are NOT DETECTED.  The SARS-CoV-2 RNA is generally detectable in upper respiratory specimens during the acute phase of infection. The lowest concentration of SARS-CoV-2 viral copies this assay can detect is 138 copies/mL. A negative result does not preclude SARS-Cov-2 infection and should not be used as the sole basis for treatment or other patient management decisions. A negative result may occur with  improper specimen collection/handling, submission of specimen other than nasopharyngeal swab, presence of viral mutation(s) within the areas targeted by this assay, and inadequate number of viral copies(<138 copies/mL). A negative result must  be combined with clinical observations, patient history, and epidemiological information. The expected result is Negative.  Fact Sheet for Patients:  BloggerCourse.com  Fact Sheet for Healthcare Providers:  SeriousBroker.it  This test is no                          t yet approved or cleared by the [place] FDA and  has been authorized for detection and/or diagnosis of SARS-CoV-2 by FDA under an Emergency Use Authorization (EUA). This EUA will remain  in effect (meaning this test can be used) for the duration of the COVID-19 declaration under Section 564(b)(1) of the Act, 21 U.S.C.section 360bbb-3(b)(1), unless the authorization is terminated  or revoked sooner.       Influenza A by PCR 03/21/2021 NEGATIVE  NEGATIVE Final   Influenza B by PCR 03/21/2021 NEGATIVE  NEGATIVE Final   Comment: (NOTE) The Xpert Xpress SARS-CoV-2/FLU/RSV plus assay is intended as an aid in the diagnosis of influenza from Nasopharyngeal swab specimens and should not be used as a sole basis for treatment. Nasal washings and aspirates are unacceptable for Xpert Xpress SARS-CoV-2/FLU/RSV testing.  Fact Sheet for Patients: BloggerCourse.com  Fact Sheet for Healthcare Providers: SeriousBroker.it  This test is not yet approved  or cleared by the [place] and has been authorized for detection and/or diagnosis of SARS-CoV-2 by FDA under an Emergency Use Authorization (EUA). This EUA will remain in effect (meaning this test can be used) for the duration of the COVID-19 declaration under Section 564(b)(1) of the Act, 21 U.S.C. section 360bbb-3(b)(1), unless the authorization is terminated or revoked.  Performed at [facility], [address] [place], [place] [address]   Admission on 03/18/2021, Discharged on 03/18/2021  Component Date Value Ref Range Status   Troponin I  (High Sensitivity) 03/18/2021 2  <18 ng/L Final   Comment: (NOTE) Elevated high sensitivity troponin I (hsTnI) values and significant  changes across serial measurements may suggest ACS but many other  chronic and acute conditions are known to elevate hsTnI results.  Refer to the "Links" section for chest pain algorithms and additional  guidance. Performed at [facility], [address] [place], [place] [address]    Sodium 03/18/2021 133 (A) 135 - 145 mmol/L Final   Potassium 03/18/2021 3.5  3.5 - 5.1 mmol/L Final   Chloride 03/18/2021 99  98 - 111 mmol/L Final   CO2 03/18/2021 21 (A) 22 - 32 mmol/L Final   Glucose, Bld 03/18/2021 113 (A) 70 - 99 mg/dL Final   Glucose reference range applies only to samples taken after fasting for at least 8 hours.   BUN 03/18/2021 13  6 - 20 mg/dL Final   Creatinine, Ser 03/18/2021 0.94  0.61 - 1.24 mg/dL Final   Calcium 52/84/1324 9.4  8.9 - 10.3 mg/dL Final   Total Protein 40/09/2724 8.5 (A) 6.5 - 8.1 g/dL Final   Albumin 36/64/4034 4.7  3.5 - 5.0 g/dL Final   AST 74/25/9563 31  15 - 41 U/L Final   ALT 03/18/2021 39  0 - 44 U/L Final   Alkaline Phosphatase 03/18/2021 80  38 - 126 U/L Final   Total Bilirubin 03/18/2021 0.9  0.3 - 1.2 mg/dL Final   GFR, Estimated 03/18/2021 >60  >60 mL/min Final   Comment: (NOTE) Calculated using the CKD-EPI Creatinine Equation (2021)    Anion gap 03/18/2021 13  5 - 15 Final   Performed at [facility], [address] [place], [place] [address]   WBC 03/18/2021 14.1 (A) 4.0 - 10.5 K/uL Final   RBC 03/18/2021 5.15  4.22 - 5.81 MIL/uL Final   Hemoglobin 03/18/2021 16.2  13.0 - 17.0 g/dL Final   HCT 33/29/5188 47.7  39.0 - 52.0 % Final   MCV 03/18/2021 92.6  80.0 - 100.0 fL Final   MCH 03/18/2021 31.5  26.0 - 34.0 pg Final   MCHC 03/18/2021 34.0  30.0 - 36.0 g/dL Final   RDW 41/66/0630 12.9  11.5 - 15.5 % Final   Platelets 03/18/2021 284  150 - 400 K/uL Final   nRBC 03/18/2021 0.0  0.0 -  0.2 % Final   Neutrophils Relative % 03/18/2021 73  % Final   Neutro Abs 03/18/2021 10.4 (A) 1.7 - 7.7 K/uL Final   Lymphocytes Relative 03/18/2021 17  % Final   Lymphs Abs 03/18/2021 2.4  0.7 - 4.0 K/uL Final   Monocytes Relative 03/18/2021 7  % Final   Monocytes Absolute 03/18/2021 1.1 (A) 0.1 - 1.0 K/uL Final   Eosinophils Relative 03/18/2021 1  % Final   Eosinophils Absolute 03/18/2021 0.1  0.0 - 0.5 K/uL Final   Basophils Relative 03/18/2021 1  % Final   Basophils  Absolute 03/18/2021 0.1  0.0 - 0.1 K/uL Final   Immature Granulocytes 03/18/2021 1  % Final   Abs Immature Granulocytes 03/18/2021 0.07  0.00 - 0.07 K/uL Final   Performed at [facility], [address] [place], [place] [address]  Admission on 03/16/2021, Discharged on 03/16/2021  Component Date Value Ref Range Status   Sodium 03/16/2021 132 (A) 135 - 145 mmol/L Final   Potassium 03/16/2021 3.5  3.5 - 5.1 mmol/L Final   Chloride 03/16/2021 100  98 - 111 mmol/L Final   CO2 03/16/2021 21 (A) 22 - 32 mmol/L Final   Glucose, Bld 03/16/2021 96  70 - 99 mg/dL Final   Glucose reference range applies only to samples taken after fasting for at least 8 hours.   BUN 03/16/2021 12  6 - 20 mg/dL Final   Creatinine, Ser 03/16/2021 1.08  0.61 - 1.24 mg/dL Final   Calcium 60/45/4098 9.1  8.9 - 10.3 mg/dL Final   Total Protein 11/91/4782 7.7  6.5 - 8.1 g/dL Final   Albumin 95/62/1308 4.5  3.5 - 5.0 g/dL Final   AST 65/78/4696 37  15 - 41 U/L Final   ALT 03/16/2021 41  0 - 44 U/L Final   Alkaline Phosphatase 03/16/2021 77  38 - 126 U/L Final   Total Bilirubin 03/16/2021 0.7  0.3 - 1.2 mg/dL Final   GFR, Estimated 03/16/2021 >60  >60 mL/min Final   Comment: (NOTE) Calculated using the CKD-EPI Creatinine Equation (2021)    Anion gap 03/16/2021 11  5 - 15 Final   Performed at [facility], [address] [place], [place] [address]   Troponin I (High Sensitivity) 03/16/2021 4  <18 ng/L Final   Comment:  (NOTE) Elevated high sensitivity troponin I (hsTnI) values and significant  changes across serial measurements may suggest ACS but many other  chronic and acute conditions are known to elevate hsTnI results.  Refer to the "Links" section for chest pain algorithms and additional  guidance. Performed at [facility], [address] [place], [place] [address]    WBC 03/16/2021 15.4 (A) 4.0 - 10.5 K/uL Final   RBC 03/16/2021 5.04  4.22 - 5.81 MIL/uL Final   Hemoglobin 03/16/2021 15.8  13.0 - 17.0 g/dL Final   HCT 40/09/2724 47.0  39.0 - 52.0 % Final   MCV 03/16/2021 93.3  80.0 - 100.0 fL Final   MCH 03/16/2021 31.3  26.0 - 34.0 pg Final   MCHC 03/16/2021 33.6  30.0 - 36.0 g/dL Final   RDW 36/64/4034 13.1  11.5 - 15.5 % Final   Platelets 03/16/2021 264  150 - 400 K/uL Final   nRBC 03/16/2021 0.0  0.0 - 0.2 % Final   Neutrophils Relative % 03/16/2021 73  % Final   Neutro Abs 03/16/2021 11.3 (A) 1.7 - 7.7 K/uL Final   Lymphocytes Relative 03/16/2021 15  % Final   Lymphs Abs 03/16/2021 2.4  0.7 - 4.0 K/uL Final   Monocytes Relative 03/16/2021 9  % Final   Monocytes Absolute 03/16/2021 1.4 (A) 0.1 - 1.0 K/uL Final   Eosinophils Relative 03/16/2021 1  % Final   Eosinophils Absolute 03/16/2021 0.2  0.0 - 0.5 K/uL Final   Basophils Relative 03/16/2021 1  % Final   Basophils Absolute 03/16/2021 0.1  0.0 - 0.1 K/uL Final   Immature Granulocytes 03/16/2021 1  % Final   Abs Immature Granulocytes 03/16/2021 0.08 (A) 0.00 - 0.07 K/uL  Final   Performed at [facility], [address] [place], [place] [address]   Alcohol, Ethyl (B) 03/16/2021 <10  <10 mg/dL Final   Comment:        LOWEST DETECTABLE LIMIT FOR SERUM ALCOHOL IS 10 mg/dL FOR MEDICAL PURPOSES ONLY (NOTE) Lowest detectable limit for serum alcohol is 10 mg/dL.  For medical purposes only. Performed at [facility], [address] [place], [place] [address]    Troponin I (High Sensitivity)  03/16/2021 5  <18 ng/L Final   Comment: (NOTE) Elevated high sensitivity troponin I (hsTnI) values and significant  changes across serial measurements may suggest ACS but many other  chronic and acute conditions are known to elevate hsTnI results.  Refer to the "Links" section for chest pain algorithms and additional  guidance. Performed at [facility], [address] [place], [place] [address]     Blood Alcohol level:  Lab Results  Component Value Date   [facility] <10 07/13/2021   ETH <10 04/13/2021    Metabolic Disorder Labs: Lab Results  Component Value Date   HGBA1C 5.8 (H) 07/26/2021   MPG 119.76 07/26/2021   MPG 114.02 07/17/2021   No results found for: PROLACTIN Lab Results  Component Value Date   CHOL 192 07/17/2021   TRIG 82 07/17/2021   HDL 48 07/17/2021   CHOLHDL 4.0 07/17/2021   VLDL 16 07/17/2021   LDLCALC 128 (H) 07/17/2021   LDLCALC 91 03/24/2021    Therapeutic Lab Levels: No results found for: LITHIUM No results found for: VALPROATE No components found for:  CBMZ  Physical Findings   AIMS    Flowsheet Row [facility] Ad[facility]) [facility]  AIMS Total Score 0 0      AUDIT    Flowsheet Row [facility]) [facility] Ad[facility] (Dis[facility]) [facility]  Alcohol Use Disorder Identification Test Final Score (AUDIT) 35 30      PHQ2-9    Flowsheet Row ED from 08/11/2020 in [facility]  PHQ-2 Total Score 4  PHQ-9 Total Score 18      Flowsheet Row Ad[facility]) [facility] ED from [facility]13/2021 in [facility] from 04/13/2021 in [facility]   C-SSRS RISK CATEGORY High Risk High Risk Error: Q7 should not be populated when Q6 is No        Musculoskeletal  Strength & Muscle Tone: within normal limits Gait & Station: normal Patient leans: N/A  Psychiatric Specialty Exam  Presentation  General Appearance: Appropriate for Environment; Casual  Eye Contact:Good  Speech:Clear and Coherent; Normal Rate  Speech Volume:Normal  Handedness:Right   Mood and Affect  Mood:Dysphoric  Affect:Appropriate; Congruent   Thought Process  Thought Processes:Coherent; Goal Directed; Linear  Descriptions of Associations:Intact  Orientation:Full (Time, Place and Person)  Thought Content:WDL; Logical  Diagnosis of Schizophrenia or Schizoaffective disorder in past: No    Hallucinations:Hallucinations: Other (comment)  Ideas of Reference:None  Suicidal Thoughts:Suicidal Thoughts: Yes, Passive SI Passive Intent and/or Plan: Without Intent; Without Plan  Homicidal Thoughts:Homicidal Thoughts: No   Sensorium  Memory:Immediate Good; Recent Good; Remote Good  Judgment:Fair  Insight:Lacking   Executive Functions  Concentration:Fair  Attention Span:Fair  Recall:Fair  Fund of  Knowledge:Fair  Language:Good   Psychomotor Activity  Psychomotor Activity:Psychomotor Activity: Normal   Assets  Assets:Communication Skills; Resilience   Sleep  Sleep:Sleep: Poor   Nutritional Assessment (For [facility] and [facility] admissions only) Has the patient had a weight loss or gain of 10 pounds or more in the last 3 months?: No Has the patient had a decrease in food intake/or appetite?: No Does the patient have dental problems?: No Does the patient have eating habits or behaviors that may be indicators of an eating disorder including binging or inducing vomiting?: No Has the patient recently lost weight without trying?: No Has the patient been eating poorly because of a decreased appetite?: No Malnutrition Screening Tool Score:  0   Physical Exam  Physical Exam Constitutional:      Appearance: Normal appearance. He is normal weight.  HENT:     Head: Normocephalic and atraumatic.  Eyes:     Extraocular Movements: Extraocular movements intact.  Pulmonary:     Effort: Pulmonary effort is normal.  Neurological:     General: No focal deficit present.     Mental Status: He is alert.    Review of Systems  Constitutional:  Negative for chills and fever.  HENT:  Negative for hearing loss.   Eyes:  Negative for discharge and redness.  Respiratory:  Negative for cough.   Cardiovascular:  Negative for chest pain.  Gastrointestinal:  Negative for abdominal pain.  Musculoskeletal:  Negative for myalgias.  Neurological:  Negative for headaches.  Psychiatric/Behavioral:  Positive for depression and substance abuse. Negative for hallucinations. The patient is nervous/anxious and has insomnia.   Blood pressure 116/90, pulse 84, temperature 97.8 F (36.6 C), temperature source Oral, resp. rate 18, SpO2 98 %. There is no height or weight on file to calculate BMI.  Treatment Plan Summary: 40 yo male with a history of depression, alcohol use disorder (last drink 8/14) who presented to the [facility]  on 07/26/54 from [facility] for SI after discharged from [facility] earlier in the day. Pt indicates that he did not want to go to [facility] due to it being a no smoking facility.  There is high suspicion for malingering given presentation and for medication seeking behavior as he is requesting controlled substances for "alcohol withdrawal" despite last drink being 12 days ago, VSS and no objective concerns for withdrawal. Per chart review, it does not appear that he received any PRNs for alcohol withdrawal sx during admission at [facility]. Patient is unable to contract for safety at this time and is appropriate for [facility].   MDD without psychotic features (r/o alcohol induced depressive d/o)  - Prozac 40mg  - Continue Seroquel 100mg  qhs as an adjunct for  mood, may also assist with depression help with anxiety, depress    Alcohol Use disorder -Continue Thiamine 100 mg daily -Continue Gabapentin 300 mg TID for anxiety/cravings/withdrawal    Tobacco use disorder - Continue nicorette gum.    See MAR for all PRNs  Of note,per chart review, patient was largely non participatory in groups and had to be placed on room lockout. Consider this if patient is non participatory while in the [facility]  Dispo: rehab vs shelter. SW to assist to dispo planning   Estella Husk, MD 07/27/2021 10:50 AM

## 2021-07-27 NOTE — Progress Notes (Signed)
Patient expressed anxiety, vistaril 50 mg given. Patient also received nicotine gum per request. Nursing staff will continue to monitor.

## 2021-07-27 NOTE — Group Therapy Note (Signed)
Wellness Friday - Wellness (8 Dimensions of Wellness)  Date: 07/27/21  Type of Therapy/Therapeutic Modalities: Psycho-Educational, Supportive, Motivational Interviewing; Holistic   Participation Level: Minimal  Objective: To challenge patients to reflect on their recovery process utilizing a holistic approach by reviewing the 8 Dimensions of Wellness to assist them in making positive changes in their lives by impacting the mind, body, and spirit. The 8 Dimensions of wellness include: emotional, occupational, physical, social, intellectual, and spiritual.   Therapeutic Goals:  1. Patient will learn about the different dimensions and what factors contribute to each respectively.  2. Patient will explore which of the 8 dimensions are personally exceptional and those that they may struggle maintaining due to various factors, including mental health issues.  3. Patient will discuss how they plans to enhance each of the 8 dimensions to achieve overall wellness.  Summary of Patient's Progress: Pt attended group. Was not very active. Did not want to participate. Minimal engagement. Did not complete worksheet with group. Pt only stated that he would feel better not doing much but having coffee often.

## 2021-07-28 DIAGNOSIS — R45851 Suicidal ideations: Secondary | ICD-10-CM | POA: Diagnosis not present

## 2021-07-28 DIAGNOSIS — Z20822 Contact with and (suspected) exposure to covid-19: Secondary | ICD-10-CM | POA: Diagnosis not present

## 2021-07-28 DIAGNOSIS — F1021 Alcohol dependence, in remission: Secondary | ICD-10-CM | POA: Diagnosis not present

## 2021-07-28 DIAGNOSIS — F331 Major depressive disorder, recurrent, moderate: Secondary | ICD-10-CM | POA: Diagnosis not present

## 2021-07-28 MED ORDER — HYDROXYZINE HCL 25 MG PO TABS: 25.0000 mg | ORAL_TABLET | Freq: Three times a day (TID) | ORAL | 0 refills | Status: AC | PRN

## 2021-07-28 MED ORDER — QUETIAPINE FUMARATE 100 MG PO TABS: 100.0000 mg | ORAL_TABLET | Freq: Every day | ORAL | 0 refills | Status: AC

## 2021-07-28 MED ORDER — FLUOXETINE HCL 40 MG PO CAPS: 40.0000 mg | ORAL_CAPSULE | Freq: Every day | ORAL | 0 refills | Status: AC

## 2021-07-28 MED ORDER — BUSPIRONE HCL 10 MG PO TABS: 10.0000 mg | ORAL_TABLET | Freq: Three times a day (TID) | ORAL | 0 refills | Status: AC

## 2021-07-28 MED ORDER — TRAZODONE HCL 50 MG PO TABS: 50.0000 mg | ORAL_TABLET | Freq: Every evening | ORAL | 0 refills | Status: AC | PRN

## 2021-07-28 MED ORDER — GABAPENTIN 300 MG PO CAPS: 300.0000 mg | ORAL_CAPSULE | Freq: Three times a day (TID) | ORAL | 0 refills | Status: AC

## 2021-07-28 NOTE — Progress Notes (Signed)
Patient given lunch and decaf coffee.  Requested and given nicorette gum.

## 2021-07-28 NOTE — Progress Notes (Signed)
AVS and safety plan reviewed with patient.  All questions answered.  Patient verbalized understanding of information presented. Given prescriptions.  Patient ambulated independently to lobby without issue.  Discharged in stable condition; no acute distress noted.

## 2021-07-28 NOTE — Discharge Instructions (Addendum)

## 2021-07-28 NOTE — Progress Notes (Signed)
Given  breakfast

## 2021-07-28 NOTE — Progress Notes (Signed)
Patient resting in bed quietly.  RR even and unlabored.  Continue to monitor for safety.

## 2021-07-28 NOTE — ED Provider Notes (Addendum)
FBC/OBS ASAP Discharge Summary  Date and Time: 07/28/2021 5:36 PM  Name: Carlos Flowers  MRN:  [id number]   Discharge Diagnoses:  Final diagnoses:  Moderate episode of recurrent major depressive disorder (HCC)  Suicidal ideation  Alcohol use disorder, moderate, in early remission (HCC)  Tobacco use disorder  Malingering  Homelessness    Subjective:   Carlos Flowers, 40 y.o., male patient who initially presented as a walk-in to the [facility] and was admitted to the [facility] on 07/26/2021.  Patient seen face to face by this provider, consulted with the treatment team and Dr. Bronwen Betters; and chart reviewed on 07/28/21.  Patient is well-known to the service.  Has a history of SI, MDD, and polysubstance abuse.  During evaluation Carlos Flowers is in sitting position in his bed.  He is fairly groomed and makes good eye contact.  His speech is clear, coherent, normal rate and tone.  He makes good eye contact.  His mood is dysphoric with a congruent affect.  He is is alert/oriented x 4 and calm/cooperative.  He is thought process is coherent and relevant; There is no indication that he is currently responding to internal/external stimuli or experiencing delusional thought content.  Denies auditory and visual hallucinations. Patient denies suicidal ideations. This was discussed before we talked about discharge.  He does not have access to firearms/weapons.  Once patient was being told he was going to be discharged today, patient became very agitated.  Patient kept referring back to being homeless and how he has no where to go.  Continues to state  he needs detox and rehabilitation from substances.  Discussed his negative ethanol and urine drug screen on 07/26/2021. He continued to state he has no where to go, "I am homeless".  Reports he does not want to go to [facility] because he cannot smoke, patient questioned why he cannot stay until Monday and he would possibly reconsider going to [facility]. States,  "it would be better than being homeless". Then discussed with patient that he can not smoke at this facility but he wants to stay. Patient did not respond. States there has to be other places he could go [employer].  Educated patient that is a facility he would need to follow-up with on his own in an outpatient setting. [name] information was provided for patient to follow-up with if he continues to want rehabilitation.  Educated patient that [facility] currently has a 2-week wait..  Patient ask if he could stay at the [facility] until an opening was available at [facility].  Explained that he can follow-up with [facility] outpatient, provided resources.  Discussed his homelessness explained admissions into the [facility] are short-term.  Patient was very agitated and states "how can you put me out on the streets, I thought this was a place I could stay until I got the help I needed". Explained that we can not help with this homelessness. Discussed patient's multiple, excessive visits to various ED's and resources that have been provided to him that were not successful.  Per chart review patient has been given many resources with referrals.  Patient admits during assessment that he has not technically "had to sleep on the street".  States, "I am either in the hospital or I stay with my girlfriend but she is not talking to me right now".  Patient refuses to stay in a shelter, resources were provided.   This patient has a significant history of malingering, poly  substance abuse and SI, who presents presented to ED with complaints of suicidal ideation.  Patient was observed for two nights in the [facility] unit and no unsafe behavior was observed. He did not attend group sessions. Per chart review he was on room lock out for not participating in groups during his last [facility] admission, he was discharged 07/02/2021.  He was provided referral to residential outpatient substance abuse treatment.  He did not follow-up with  resources.  This patient has a history of endorsing suicidal ideation, which resolves once housing is arranged for him.  He has presented to various facilities at least 15 times in the past 4 months, this includes 2 behavioral health admissions at [facility].  While on the [facility] unit he has demonstrated no unsafe behavior.  There is no evidence of a psychiatric condition which is amendable to inpatient psychiatric hospitalization.  Patients' suicidal ideation appears to be conditional and is a means of manipulation and attention seeking without a true intention to harm himself.  His suicidal ideations are circumstantial to his homelessness.  His behavior is more consistent with someone who is acting in self-preservation, rather than someone who is acutely suicidal.   Given all that is stated above and including the fact that the patient shows no signs of mania or psychosis and has a liner, coherent thought process throughout the interview today, the patient does not meet criteria for inpatient psychiatric treatment or further psychiatric evaluation at this time.  Patient would benefit from outpatient treatment and resources will be provided to him prior to discharge from the [facility] unit.   Collateral: Andrez Grime [phone] states she will provide a ride for [place].States she is a sponsor and a recovering addict. States she has been discussing [facility] with patient due to his homelessness.  States she does not know the patient very well but has been willing to help him with [facility].  Stay Summary:   Carlos Flowers was admitted to the [facility] unit for Depression suicidal ideation and crisis management.  He was treated with the following medications; BuSpar, Prozac, gabapentin, hydroxyzine, and Seroquel which were tolerated with no adverse reactions.   Carlos Flowers was discharged with current medication and was instructed on how to take medications as prescribed; (details listed below under  Medication List).  Patient states he did not have printed prescription for medications, states "I do not think I was ever given any".  Printed prescription for BuSpar p.o. 10 mg 3 times daily, Prozac 40 mg p.o. daily, gabapentin 300 mg p.o. 3 times daily, hydroxyzine 25 mg p.o. 3 times daily and Seroquel 100 mg p.o. nightly.  All provide a 30-day supply.  Explained open access walk-in hours to behavioral health outpatient services on the second floor.  Carlos Flowers's  emotional and mental status was monitored by staff.  He showed no unsafe behavior while on the unit.  He refused to participate in group sessions. He was evaluated for stability and plans for continued recovery upon discharge. The following was addressed as part of his discharge planning and follow up treatment:  Employment, housing, transportation, bed availability, health status, family support, and any pending legal issues were also considered during his admission.  He was offered further treatment options upon discharge including but not limited to Residential, Intensive Outpatient, Outpatient treatment, Rehabilitation services, and resources for shelters and [facility] if needed.  Resources were provided.  Encouraged Carlos Flowers to follow up with the services as listed  below under Follow up Information.     Upon completion of this admission the Carlos Flowers was both mentally and medically stable for discharge denying suicidal/homicidal ideation, auditory/visual/tactile hallucinations, delusional thoughts and paranoia.     Total Time spent with patient: 30 minutes  Past Psychiatric History: MDD, polysubstance abuse, SI Past Medical History:  Past Medical History:  Diagnosis Date   Alcoholism (HCC)    Anxiety    Depression    Substance abuse (HCC)    No past surgical history on file. Family History:  Family History  Problem Relation Age of Onset   Cancer Maternal Grandmother    Heart disease Paternal  Grandmother    Heart disease Paternal Grandfather    Family Psychiatric History: Unknown Social History:  Social History   Substance and Sexual Activity  Alcohol Use Yes   Alcohol/week: 168.0 standard drinks   Types: 168 Cans of beer per week     Social History   Substance and Sexual Activity  Drug Use Not Currently   Types: Cocaine, Heroin, Methamphetamines   Comment: Last used methamphetamines "several months ago"    Social History   Socioeconomic History   Marital status: Single    Spouse name: Not on file   Number of children: Not on file   Years of education: Not on file   Highest education level: Not on file  Occupational History   Not on file  Tobacco Use   Smoking status: Every Day    Packs/day: 1.00    Years: 17.00    Pack years: 17.00    Types: Cigarettes   Smokeless tobacco: Never  Vaping Use   Vaping Use: Never used  Substance and Sexual Activity   Alcohol use: Yes    Alcohol/week: 168.0 standard drinks    Types: 168 Cans of beer per week   Drug use: Not Currently    Types: Cocaine, Heroin, Methamphetamines    Comment: Last used methamphetamines "several months ago"   Sexual activity: Not Currently  Other Topics Concern   Not on file  Social History Narrative   Not on file   Social Determinants of Health   Financial Resource Strain: Not on file  Food Insecurity: Not on file  Transportation Needs: Not on file  Physical Activity: Not on file  Stress: Not on file  Social Connections: Not on file   SDOH:  SDOH Screenings   Alcohol Screen: Medium Risk   Last Alcohol Screening Score (AUDIT): 35  Depression (PHQ2-9): Medium Risk   PHQ-2 Score: 18  Financial Resource Strain: Not on file  Food Insecurity: Not on file  Housing: Not on file  Physical Activity: Not on file  Social Connections: Not on file  Stress: Not on file  Tobacco Use: High Risk   Smoking Tobacco Use: Every Day   Smokeless Tobacco Use: Never  Transportation Needs: Not on  file    Tobacco Cessation:  A prescription for an FDA-approved tobacco cessation medication provided at discharge  Current Medications:  Current Facility-Administered Medications  Medication Dose Route Frequency Provider Last Rate Last Admin   acetaminophen (TYLENOL) tablet 650 mg  650 mg Oral Q6H PRN Park Pope, MD       alum & mag hydroxide-simeth (MAALOX/MYLANTA) 200-200-20 MG/5ML suspension 30 mL  30 mL Oral Q4H PRN Park Pope, MD       busPIRone (BUSPAR) tablet 10 mg  10 mg Oral TID Estella Husk, MD   10 mg at 07/28/21 1623  FLUoxetine (PROZAC) capsule 40 mg  40 mg Oral Daily Estella HuskLaubach, Katherine S, MD   40 mg at 07/28/21 [id number]   gabapentin (NEURONTIN) capsule 300 mg  300 mg Oral TID Estella HuskLaubach, Katherine S, MD   300 mg at 07/28/21 1623   hydrOXYzine (ATARAX/VISTARIL) tablet 25 mg  25 mg Oral TID PRN Park PopeJi, Andrew, MD   25 mg at 07/28/21 1040   hydrOXYzine (ATARAX/VISTARIL) tablet 50 mg  50 mg Oral Q6H PRN Estella HuskLaubach, Katherine S, MD   50 mg at 07/27/21 1240   Lubriderm Daily Moisture LOTN   Topical PRN Estella HuskLaubach, Katherine S, MD       magnesium hydroxide (MILK OF MAGNESIA) suspension 30 mL  30 mL Oral Daily PRN Park PopeJi, Andrew, MD       multivitamin with minerals tablet 1 tablet  1 tablet Oral q morning Estella HuskLaubach, Katherine S, MD   1 tablet at 07/28/21 [id number]   nicotine polacrilex (NICORETTE) gum 2 mg  2 mg Oral PRN Nira ConnBerry, Jason A, NP   2 mg at 07/28/21 1623   QUEtiapine (SEROQUEL) tablet 100 mg  100 mg Oral QHS Estella HuskLaubach, Katherine S, MD   100 mg at 07/27/21 2116   thiamine tablet 100 mg  100 mg Oral Daily Estella HuskLaubach, Katherine S, MD   100 mg at 07/28/21 [id number]   traZODone (DESYREL) tablet 50 mg  50 mg Oral QHS PRN Estella HuskLaubach, Katherine S, MD       Current Outpatient Medications  Medication Sig Dispense Refill   busPIRone (BUSPAR) 10 MG tablet Take 1 tablet (10 mg total) by mouth 3 (three) times daily. For anxiety 90 tablet 0   cetaphil (CETAPHIL) lotion Apply topically as needed for dry skin. (Provide pt  with the remaining lotion). 236 mL 0   FLUoxetine (PROZAC) 40 MG capsule Take 1 capsule (40 mg total) by mouth daily. For depression 30 capsule 0   gabapentin (NEURONTIN) 300 MG capsule Take 1 capsule (300 mg total) by mouth 3 (three) times daily. For alcohol withdrawal syndrome 90 capsule 0   hydrOXYzine (ATARAX/VISTARIL) 25 MG tablet Take 1 tablet (25 mg total) by mouth 3 (three) times daily as needed for anxiety. 90 tablet 0   Multiple Vitamin (MULTIVITAMIN WITH MINERALS) TABS tablet Take 1 tablet by mouth every morning.     nicotine polacrilex (NICORETTE) 2 MG gum Take 1 each (2 mg total) by mouth as needed. (May buy from over the counter): For smoking cessation 100 tablet 0   QUEtiapine (SEROQUEL) 100 MG tablet Take 1 tablet (100 mg total) by mouth at bedtime. For mood control 30 tablet 0   thiamine 100 MG tablet Take 1 tablet (100 mg total) by mouth daily. For low thiamine 30 tablet 0   traZODone (DESYREL) 50 MG tablet Take 1 tablet (50 mg total) by mouth at bedtime as needed for sleep. 30 tablet 0    PTA Medications: (Not in a hospital admission)   Musculoskeletal  Strength & Muscle Tone: within normal limits Gait & Station: normal Patient leans: N/A  Psychiatric Specialty Exam  Presentation  General Appearance: Appropriate for Environment; Casual  Eye Contact:Good  Speech:Clear and Coherent; Normal Rate  Speech Volume:Normal  Handedness:Right   Mood and Affect  Mood:Dysphoric  Affect:Congruent   Thought Process  Thought Processes:Coherent  Descriptions of Associations:Intact  Orientation:Full (Time, Place and Person)  Thought Content:WDL; Logical  Diagnosis of Schizophrenia or Schizoaffective disorder in past: No    Hallucinations:Hallucinations: None  Ideas of Reference:None  Suicidal Thoughts:Suicidal Thoughts:  No SI Passive Intent and/or Plan: Without Intent; Without Plan  Homicidal Thoughts:Homicidal Thoughts: No   Sensorium  Memory:Immediate  Good; Recent Good; Remote Good  Judgment:Fair  Insight:Fair   Executive Functions  Concentration:Good  Attention Span:Good  Recall:Good  Fund of Knowledge:Good  Language:Good   Psychomotor Activity  Psychomotor Activity:Psychomotor Activity: Normal   Assets  Assets:Communication Skills; Desire for Improvement; Financial Resources/Insurance; Physical Health; Resilience; Social Support   Sleep  Sleep:Sleep: Good Number of Hours of Sleep: 8   Nutritional Assessment (For OBS and FBC admissions only) Has the patient had a weight loss or gain of 10 pounds or more in the last 3 months?: No Has the patient had a decrease in food intake/or appetite?: No Does the patient have dental problems?: No Does the patient have eating habits or behaviors that may be indicators of an eating disorder including binging or inducing vomiting?: No Has the patient recently lost weight without trying?: No Has the patient been eating poorly because of a decreased appetite?: No Malnutrition Screening Tool Score: 0   Physical Exam  Physical Exam Vitals and nursing note reviewed.  Constitutional:      Appearance: Normal appearance. He is well-developed.  HENT:     Head: Normocephalic and atraumatic.  Eyes:     General:        Right eye: No discharge.        Left eye: No discharge.     Conjunctiva/sclera: Conjunctivae normal.  Cardiovascular:     Rate and Rhythm: Normal rate.     Heart sounds: No murmur heard. Pulmonary:     Effort: Pulmonary effort is normal. No respiratory distress.  Musculoskeletal:        General: Normal range of motion.     Cervical back: Normal range of motion.  Neurological:     Mental Status: He is alert and oriented to person, place, and time.  Psychiatric:        Attention and Perception: Attention and perception normal.        Mood and Affect: Mood normal.        Speech: Speech normal.        Behavior: Behavior normal. Behavior is cooperative.         Thought Content: Thought content normal.        Cognition and Memory: Cognition normal.        Judgment: Judgment normal.   Review of Systems  Constitutional: Negative.   HENT: Negative.  Negative for hearing loss.   Eyes: Negative.   Respiratory:  Negative for cough.   Cardiovascular: Negative.  Negative for chest pain.  Musculoskeletal: Negative.   Skin: Negative.   Neurological: Negative.   Psychiatric/Behavioral: Negative.    Blood pressure 119/87, pulse 79, temperature (!) 97.5 F (36.4 C), temperature source Oral, resp. rate 18, SpO2 97 %. There is no height or weight on file to calculate BMI.  Demographic Factors:  Male, Caucasian, Low socioeconomic status, and Unemployed  Loss Factors: Decrease in vocational status and Financial problems/change in socioeconomic status  Historical Factors: Impulsivity and homelessness   Risk Reduction Factors:   Sense of responsibility to family and Positive social support  Continued Clinical Symptoms:  Depression:   Comorbid alcohol abuse/dependence Hopelessness Impulsivity Alcohol/Substance Abuse/Dependencies  Cognitive Features That Contribute To Risk:  None    Suicide Risk:  Minimal: No identifiable suicidal ideation.  Patients presenting with no risk factors but with morbid ruminations; may be classified as minimal risk based on the severity  of the depressive symptoms  Plan Of Care/Follow-up recommendations:  Activity:  as tolerated  Diet:  regular   Disposition: discharge patient   Printed prescription for BuSpar p.o. 10 mg 3 times daily, Prozac 40 mg p.o. daily, gabapentin 300 mg p.o. 3 times daily, hydroxyzine 25 mg p.o. 3 times daily and Seroquel 100 mg p.o. nightly.  All provide a 30-day supply.   Explained open access walk-in hours to [facility] Outpatient services on the second floor.  Provided resources for [facility] and [facility], also included other outpatient psychiatric resources for substance abuse  treatment.  Provided resources for shelters.   Patient was discharged and his friend Carlos Flowers was able to pick him up.   No evidence of imminent risk to self or others at present.    Patient does not meet criteria for psychiatric inpatient admission. Discussed crisis plan, support from social network, calling 911, coming to the Emergency Department, and calling Suicide Hotline.    Ardis Hughs, NP 07/28/2021, 5:36 PM

## 2021-07-28 NOTE — ED Notes (Signed)
Mr [place] is sleeping at this time no distress noted respirations 14 and easy skin color WNL

## 2021-07-28 NOTE — Progress Notes (Signed)
Patient given Vistaril this AM for anxiety. Currently resting on bed.

## 2021-07-28 NOTE — ED Notes (Signed)
Mr. Doucet  Had minimal interaction with staff and peers and self isolated in his room refusing wrap up group

## 2021-07-28 NOTE — Progress Notes (Signed)
Given decaf coffee X2 this AM.

## 2021-07-28 NOTE — ED Provider Notes (Signed)
[facility] Discharge Suicide Risk Assessment   Principal Problem: Depression Discharge Diagnoses: Principal Problem:   Depression Active Problems:   Malingering   Tobacco use disorder   Homelessness   Homelessness  Subjective: please see discharge summary.   Total Time spent with patient: 30 minutes  Musculoskeletal: Strength & Muscle Tone: within normal limits Gait & Station: normal Patient leans: N/A  Psychiatric Specialty Exam  Presentation  General Appearance: Appropriate for Environment; Casual  Eye Contact:Good  Speech:Clear and Coherent; Normal Rate  Speech Volume:Normal  Handedness:Right   Mood and Affect  Mood:Dysphoric  Duration of Depression Symptoms: No data recorded Affect:Congruent   Thought Process  Thought Processes:Coherent  Descriptions of Associations:Intact  Orientation:Full (Time, Place and Person)  Thought Content:WDL; Logical  History of Schizophrenia/Schizoaffective disorder:No  Duration of Psychotic Symptoms:No data recorded Hallucinations:Hallucinations: None  Ideas of Reference:None  Suicidal Thoughts:Suicidal Thoughts: No SI Passive Intent and/or Plan: Without Intent; Without Plan  Homicidal Thoughts:Homicidal Thoughts: No   Sensorium  Memory:Immediate Good; Recent Good; Remote Good  Judgment:Fair  Insight:Fair   Executive Functions  Concentration:Good  Attention Span:Good  Recall:Good  Fund of Knowledge:Good  Language:Good   Psychomotor Activity  Psychomotor Activity:Psychomotor Activity: Normal   Assets  Assets:Communication Skills; Desire for Improvement; Financial Resources/Insurance; Physical Health; Resilience; Social Support   Sleep  Sleep:Sleep: Good Number of Hours of Sleep: 8   Physical Exam: Physical Exam see discharge summary  ROSsee discharge summary  Blood pressure 119/87, pulse 79, temperature (!) 97.5 F (36.4 C), temperature source Oral, resp. rate 18, SpO2 97 %. There is no  height or weight on file to calculate BMI.  Mental Status Per Nursing Assessment::   On Admission:   Per Park Pope MD H&P 07/26/2021 Patient is a 40 year old male with history of alcohol use disorder, tobacco use disorder, major depressive disorder, anxiety, and recent [facility] admission discharged 07/26/2021.  Patient presented to [facility] with complaints of suicidal ideation with plan stating that he plan to cut his own wrists.  Patient was originally sent to [facility] following discharge from [facility] but patient stated that "he was not ready for this transition".  Patient stated that Headlee had "a couple more days" he would feel more ready to go to [facility] or "wherever".  When he has consistently what patient is looking for in terms of treatment, patient states that he is unsure and states that he has been unable to engage with treatment at pediatric such as group therapy because he has been "so depressed".  Patient states that he felt that his medications were insufficient in terms of aiding with his depression.  Patient states that he is frustrated that he is unable to get the help he needs.  Patient does admit that he has been having trouble coming to grips with reality because of the fact that he has been drinking alcohol and using substances for so long.  This Clinical research associate encouraged patient to focus on rehabilitation and making steps towards having a meaningful and productive life.  Patient was encouraged to attend group therapy while at the [facility] and was agreeable to attend.  Patient understands about he needs to make steps towards becoming more independent and more active in his treatment.   Patient denies HI AVH at this time.  Patient states that he resumed tobacco use immediately after discharge.  Patient denies alcohol use since the 14th.  When specifically asking about patient's suicidal thoughts and plan to "cut his own wrist" patient states that he would  never do anything suicidal as he "does not want to go to  hell for killing himself".  Patient was last here he was safe to go to a homeless shelter until [facility] appointment on Monday, but patient states that he would likely drink alcohol and continue to have suicidal thoughts.   Per Buck Mam, patient was found to be smoking in the bathrooms as well as outside [facility].  Patient continues to smoke even after being told by [facility] staff at [facility] that he was not allowed to smoke while at [facility].  When this writer confronted patient about this he stated that he was simply told to smoke at the end of the driveway and was uncertain why he had been unable to go back to [facility].  Patient is presently scheduled for [facility] on Monday but will have social work aid in dispo for this patient should there be an earlier solution.  Patient agreeable with this plan at this time Demographic Factors:  Male, Caucasian, Low socioeconomic status, and Unemployed  Loss Factors: Decrease in vocational status and Financial problems/change in socioeconomic status  Historical Factors: Impulsivity  Risk Reduction Factors:   Sense of responsibility to family, Positive social support, and Positive therapeutic relationship  Continued Clinical Symptoms:  Depression:   Comorbid alcohol abuse/dependence Impulsivity Alcohol/Substance Abuse/Dependencies  Cognitive Features That Contribute To Risk:  None    Suicide Risk:  Minimal: No identifiable suicidal ideation.  Patients presenting with no risk factors but with morbid ruminations; may be classified as minimal risk based on the severity of the depressive symptoms   Follow-up Information     [facility] Follow up.   Specialty: Behavioral Health Why: Medication Management Walk-In Hours: Monday-Friday from 8:00am-11:00am. Please arrive between 7:30am-7:45am as patients are seen on a first come, first served basis.   Therapy Services Walk-In hours: Monday-Wednesday from 8:00am-until slots  are full.  Friday 1:00pm-5:00pm Contact information: [address] [place] [address] [phone]        [facility]   Specialty: Addiction Medicine Contact information: [address] [place] [address] [phone]         Services, [facility].   Contact information: [address] [place] [address] [phone]         [facility].   Contact information: [address] [place] [address] [phone]        [facility]   Specialty: Addiction Medicine Contact information: [address] [place] [address] [phone]                 Plan Of Care/Follow-up recommendations:  Activity:  as tolerated  Diet:  regular   discharge patient    Printed prescription for BuSpar p.o. 10 mg 3 times daily, Prozac 40 mg p.o. daily, gabapentin 300 mg p.o. 3 times daily, hydroxyzine 25 mg p.o. 3 times daily and Seroquel 100 mg p.o. nightly.  All provide a 30-day supply.    Explained open access walk-in hours to [facility] services on the second floor.   Provided resources for [facility] and [facility], also included other outpatient psychiatric resources for substance abuse treatment.   Provided resources for shelters.    Patient was discharged and his friend Irving Burton was able to pick him up.    No evidence of imminent risk to self or others at present.    Patient does not meet criteria for  psychiatric inpatient admission. Discussed crisis plan, support from social network, calling 911, coming to the Emergency Department, and calling Suicide Hotline.      Ardis Hughs, NP 07/28/2021, 5:37 PM

## 2021-11-17 IMAGING — CT CT HEAD W/O CM
2 series · 15 of 37 positions shown, 18 images · non-contrast
Comparison: None.

CLINICAL DATA: Dizziness

EXAM:
CT HEAD WITHOUT CONTRAST
TECHNIQUE: Contiguous axial images were obtained from the base of the skull
through the vertex without intravenous contrast.

[Series 2: head wo · axial · 0.43mm/px · z∈[+658,+803]mm · 12 of 35 slices shown, 15 images]
[im 3/35  brain]
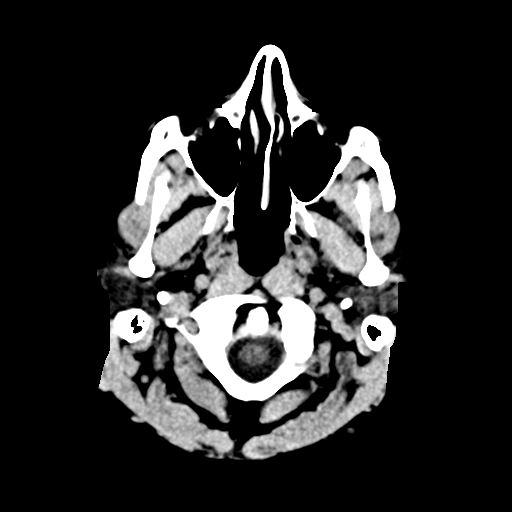
[im 3/35  bone]
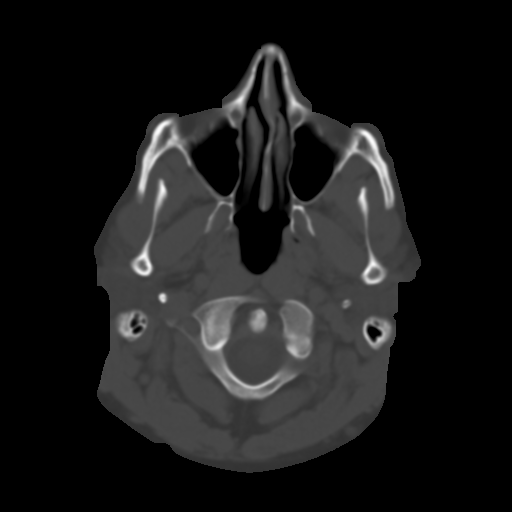
[im 5/35  brain]
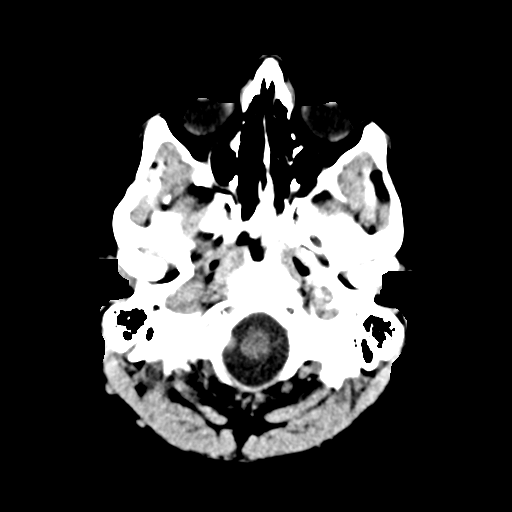
[im 8/35  brain]
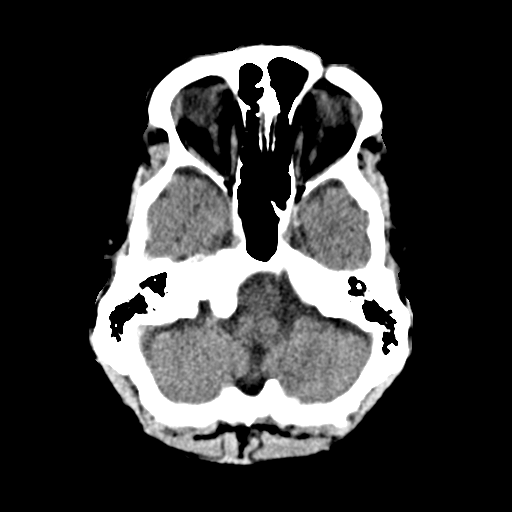
[im 11/35  brain]
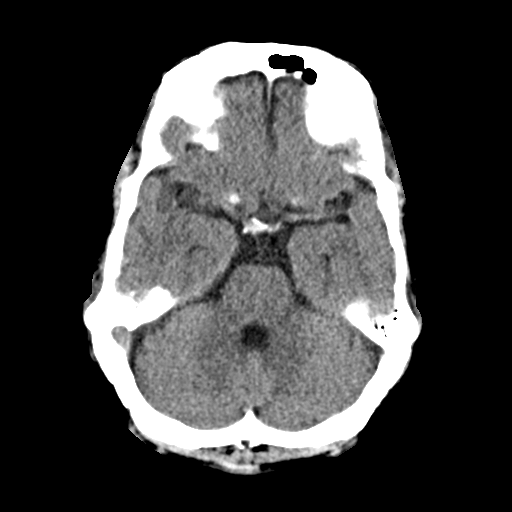
[im 13/35  brain]
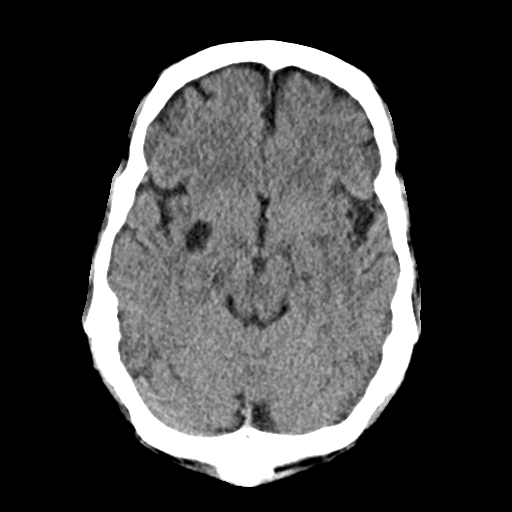
[im 13/35  bone]
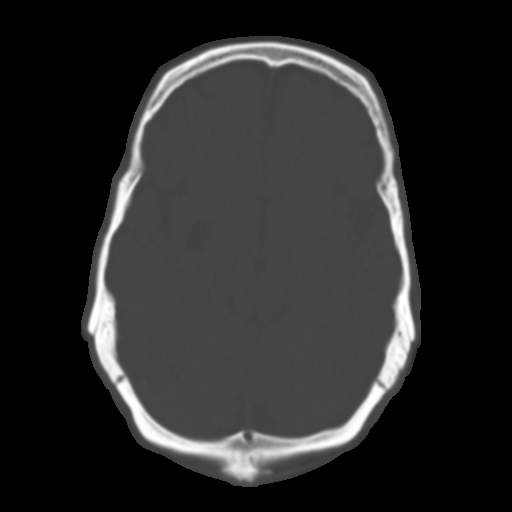
[im 16/35  brain]
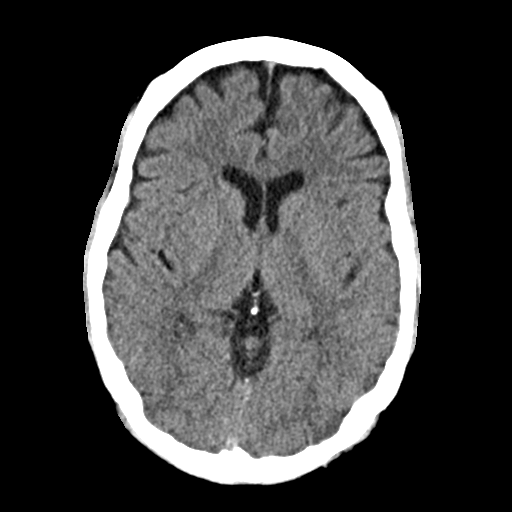
[im 19/35  brain]
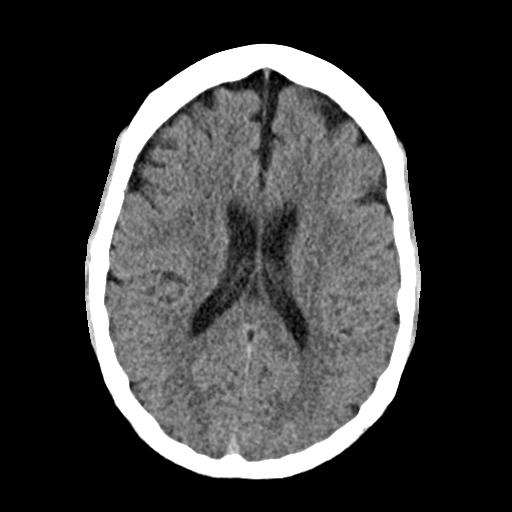
[im 22/35  brain]
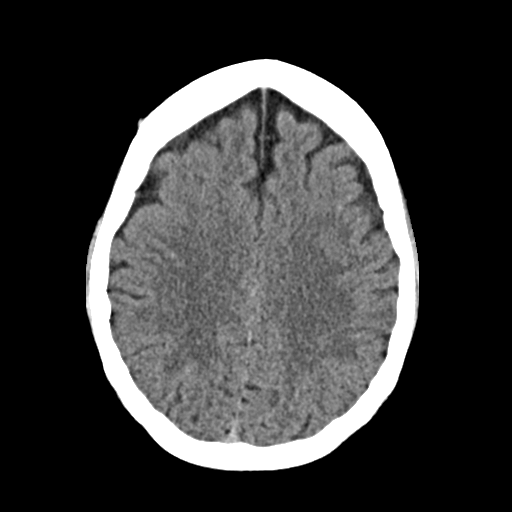
[im 24/35  brain]
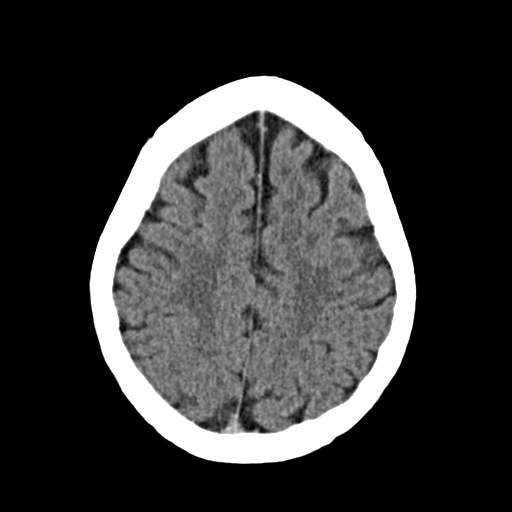
[im 24/35  bone]
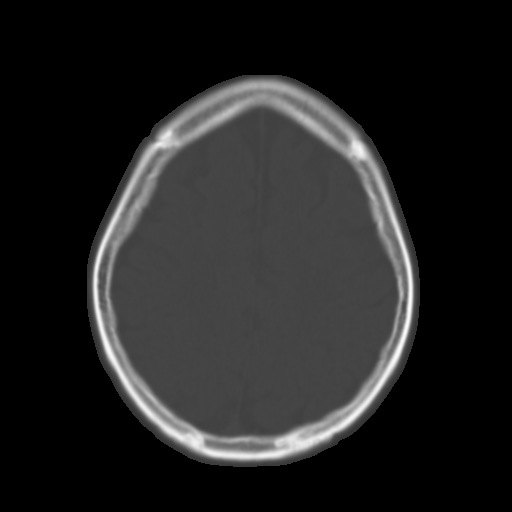
[im 27/35  brain]
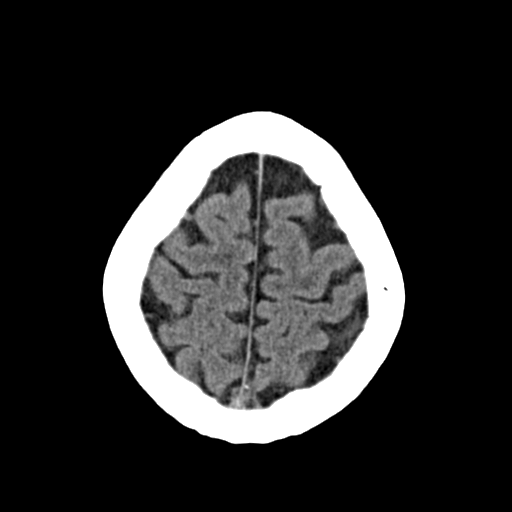
[im 30/35  brain]
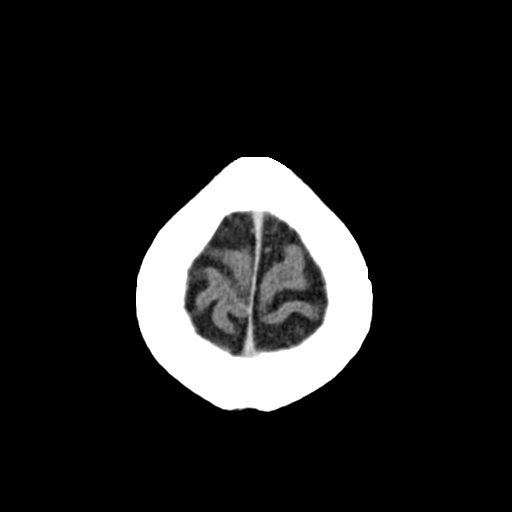
[im 32/35  brain]
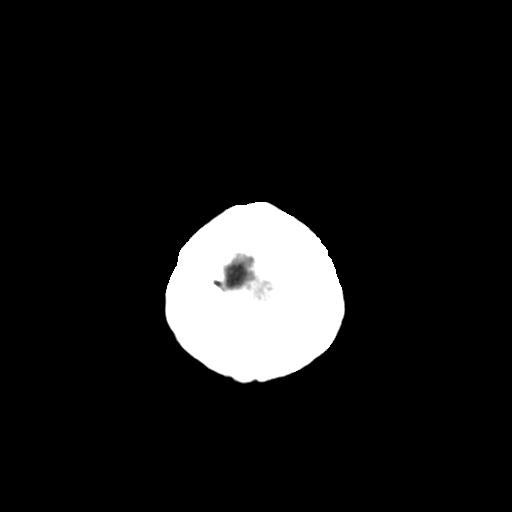

[Series 5: sag soft · sagittal · 0.34mm/px · 3 of 60 slices shown]
[im 20/60  brain]
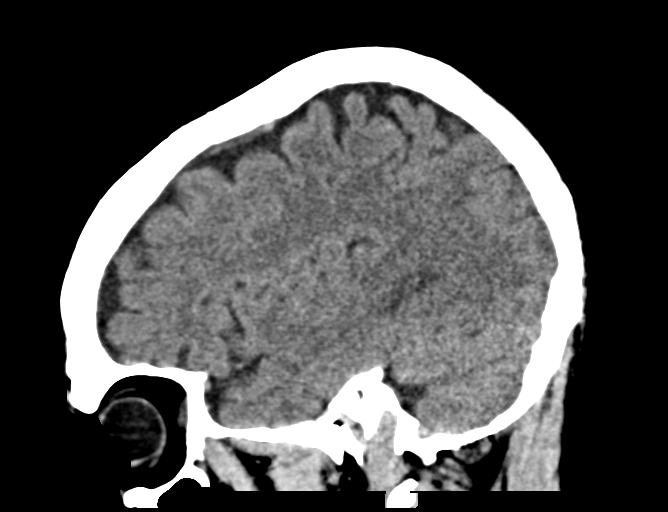
[im 30/60  brain]
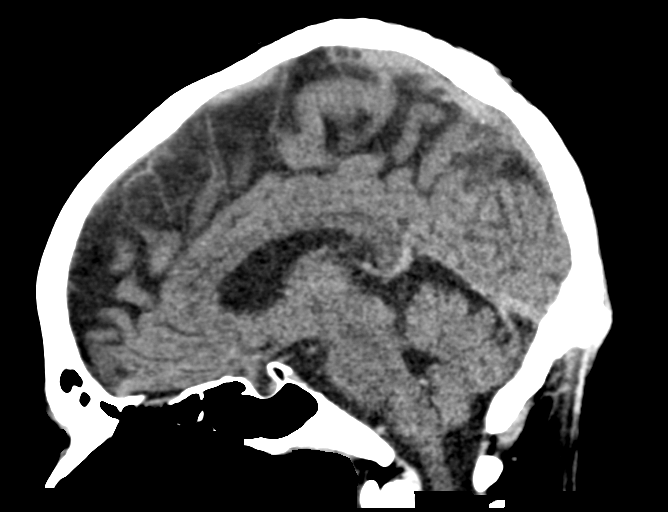
[im 40/60  brain]
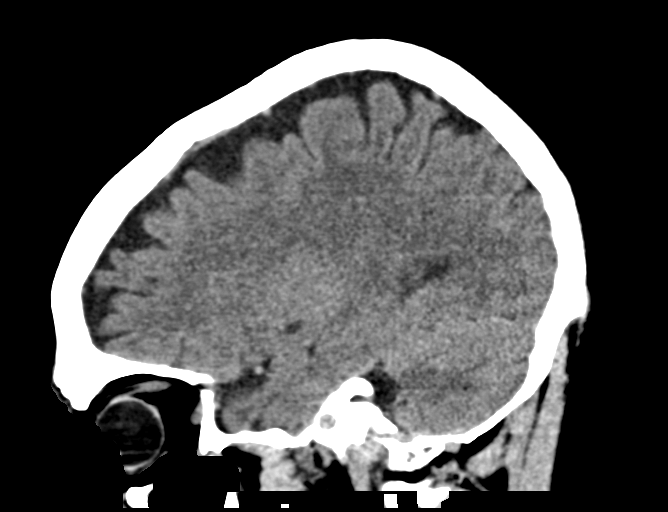

[15 of 37 positions shown; findings below may reference images not displayed]

FINDINGS: Brain: No evidence of acute infarction, hemorrhage, hydrocephalus,
extra-axial collection or mass lesion/mass effect. Prominent CSF
space is noted on the right along the medial aspect of the right
temporal lobe.

Vascular: No hyperdense vessel or unexpected calcification.

Skull: Normal. Negative for fracture or focal lesion.

Sinuses/Orbits: No acute finding.

Other: None.
IMPRESSION: No acute intracranial abnormality noted.

## 2021-11-22 IMAGING — DX DG CHEST 1V PORT
2 series · 2 of 2 positions shown · non-contrast
Comparison: March 20, 2021.

CLINICAL DATA: Chest pain.

EXAM:
PORTABLE CHEST 1 VIEW

[chest ap (1 of 2)]
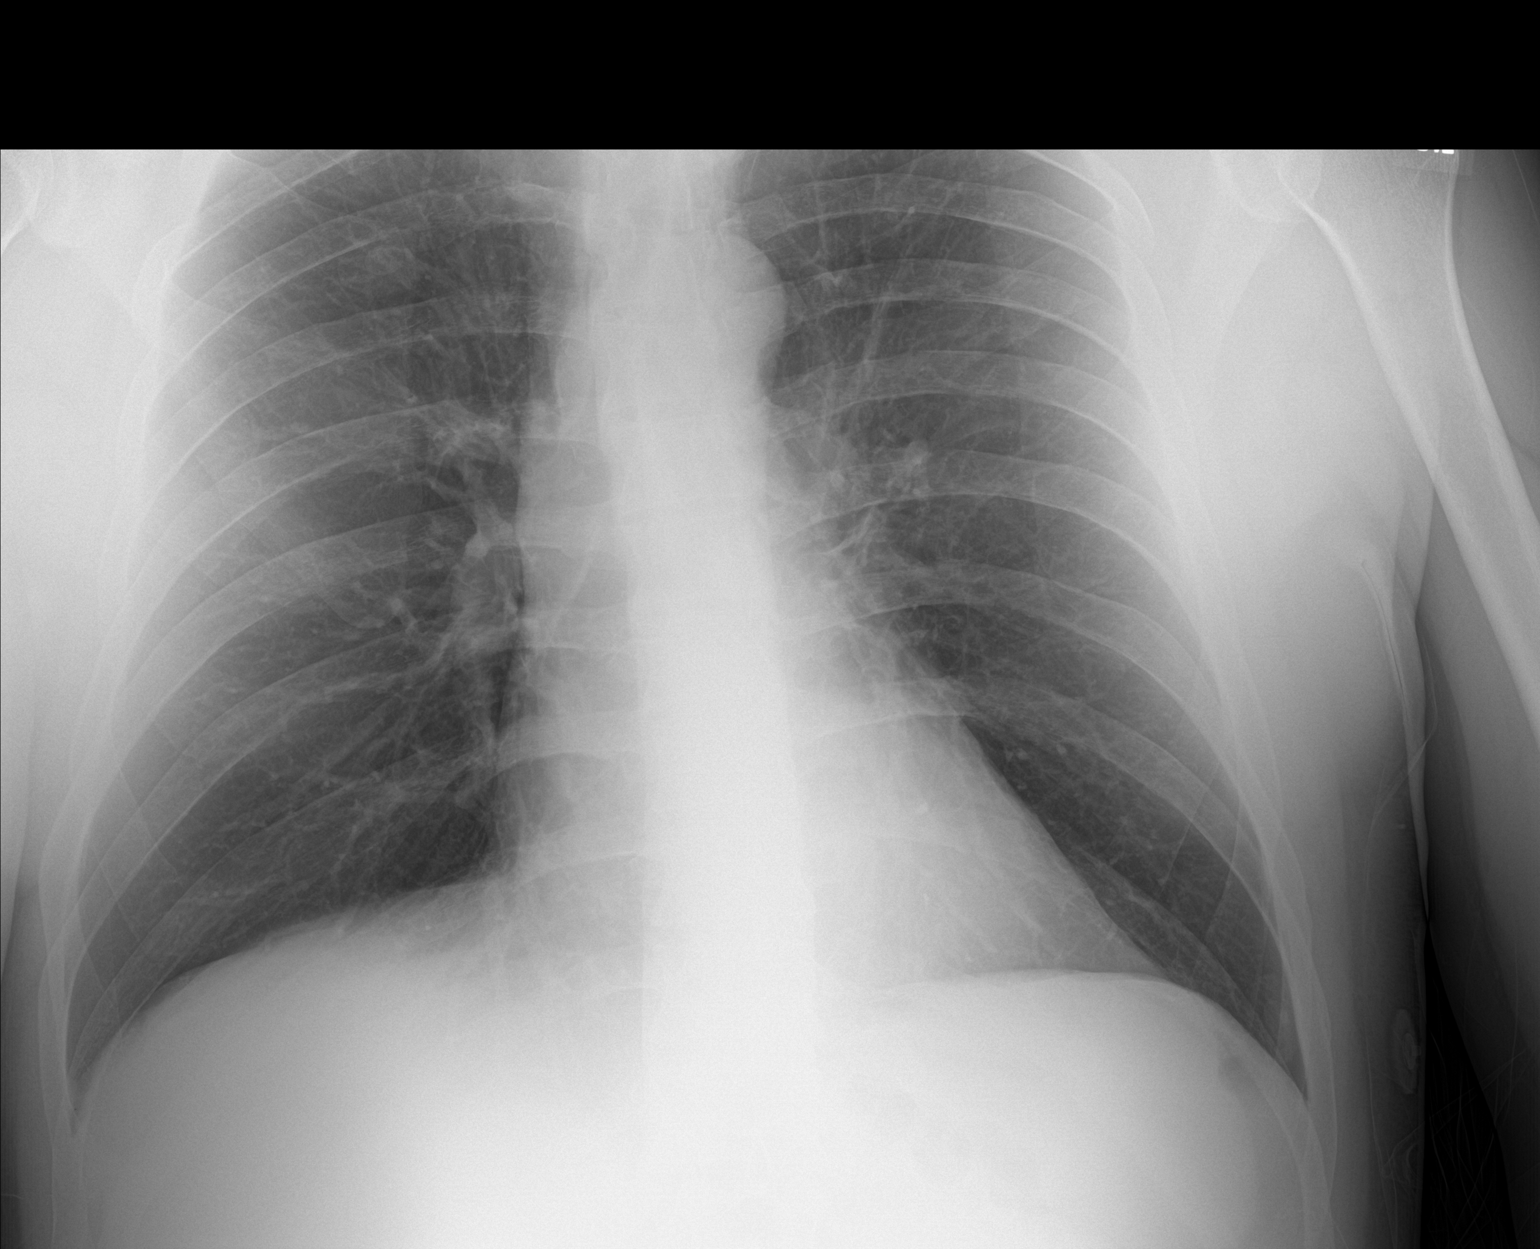

[chest ap (2 of 2)]
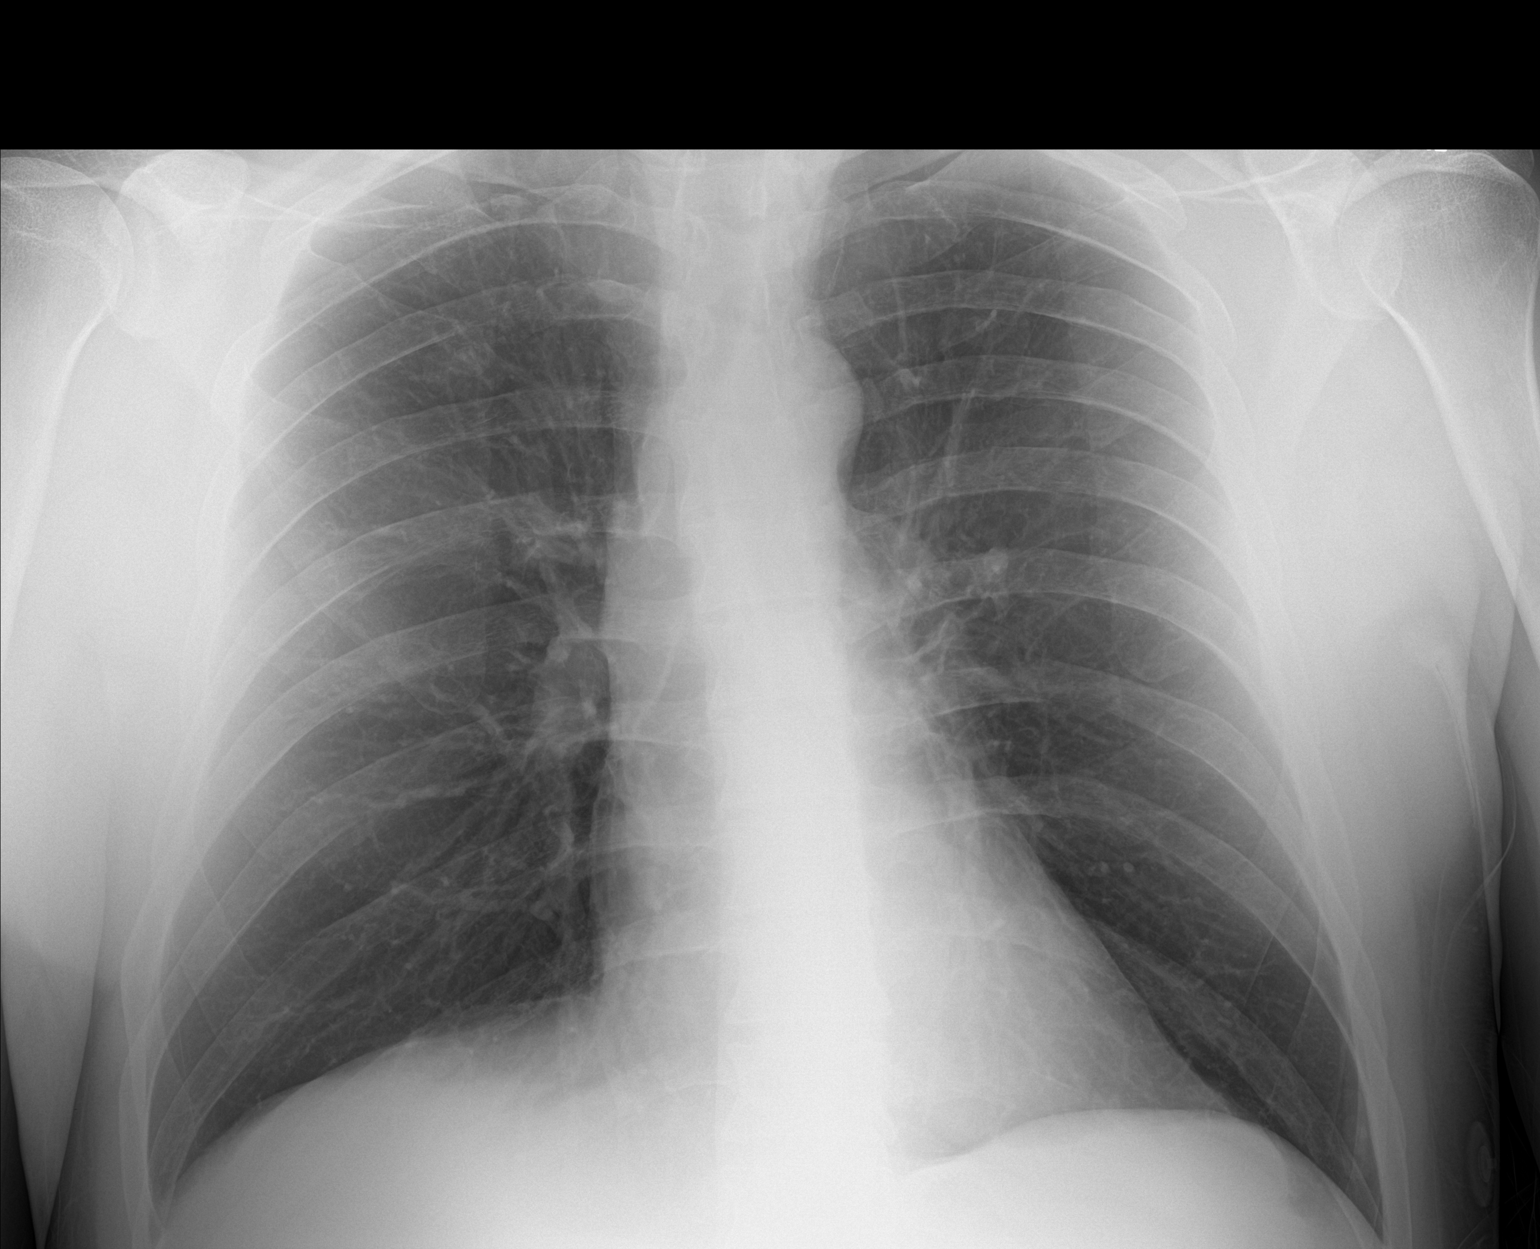

[2 of 2 positions shown; findings below may reference images not displayed]

FINDINGS: The heart size and mediastinal contours are within normal limits.
Both lungs are clear. No pneumothorax or pleural effusion is noted.
The visualized skeletal structures are unremarkable.
IMPRESSION: No active disease.

## 2022-12-21 ENCOUNTER — Other Ambulatory Visit: Payer: Self-pay

## 2022-12-21 ENCOUNTER — Emergency Department (HOSPITAL_COMMUNITY)

## 2022-12-21 ENCOUNTER — Emergency Department (HOSPITAL_COMMUNITY)
Admission: EM | Admit: 2022-12-21 | Discharge: 2022-12-21 | Attending: Emergency Medicine | Admitting: Emergency Medicine

## 2022-12-21 ENCOUNTER — Encounter (HOSPITAL_COMMUNITY): Payer: Self-pay

## 2022-12-21 DIAGNOSIS — Z91010 Allergy to peanuts: Secondary | ICD-10-CM | POA: Diagnosis not present

## 2022-12-21 DIAGNOSIS — R7989 Other specified abnormal findings of blood chemistry: Secondary | ICD-10-CM | POA: Diagnosis not present

## 2022-12-21 DIAGNOSIS — F1910 Other psychoactive substance abuse, uncomplicated: Secondary | ICD-10-CM

## 2022-12-21 DIAGNOSIS — M6282 Rhabdomyolysis: Secondary | ICD-10-CM

## 2022-12-21 DIAGNOSIS — R4182 Altered mental status, unspecified: Secondary | ICD-10-CM | POA: Insufficient documentation

## 2022-12-21 LAB — COMPREHENSIVE METABOLIC PANEL
ALT: 45 U/L — ABNORMAL HIGH (ref 0–44)
AST: 61 U/L — ABNORMAL HIGH (ref 15–41)
Albumin: 4.2 g/dL (ref 3.5–5.0)
Alkaline Phosphatase: 72 U/L (ref 38–126)
Anion gap: 14 (ref 5–15)
BUN: 14 mg/dL (ref 6–20)
CO2: 22 mmol/L (ref 22–32)
Calcium: 8.7 mg/dL — ABNORMAL LOW (ref 8.9–10.3)
Chloride: 101 mmol/L (ref 98–111)
Creatinine, Ser: 1.27 mg/dL — ABNORMAL HIGH (ref 0.61–1.24)
GFR, Estimated: >60 mL/min (ref >60)
Glucose, Bld: 83 mg/dL (ref 70–99)
Potassium: 3.9 mmol/L (ref 3.5–5.1)
Sodium: 137 mmol/L (ref 135–145)
Total Bilirubin: 1.2 mg/dL (ref 0.3–1.2)
Total Protein: 6.8 g/dL (ref 6.5–8.1)

## 2022-12-21 LAB — URINALYSIS, ROUTINE W REFLEX MICROSCOPIC
Bilirubin Urine: NEGATIVE
Glucose, UA: NEGATIVE mg/dL
Hgb urine dipstick: NEGATIVE
Ketones, ur: 20 mg/dL — AB
Leukocytes,Ua: NEGATIVE
Nitrite: NEGATIVE
Protein, ur: NEGATIVE mg/dL
Specific Gravity, Urine: 1.006 (ref 1.005–1.030)
pH: 7 (ref 5.0–8.0)

## 2022-12-21 LAB — CBC WITH DIFFERENTIAL/PLATELET
Abs Immature Granulocytes: 0.08 10*3/uL — ABNORMAL HIGH (ref 0.00–0.07)
Basophils Absolute: 0.1 10*3/uL (ref 0.0–0.1)
Basophils Relative: 0 %
Eosinophils Absolute: 0.1 10*3/uL (ref 0.0–0.5)
Eosinophils Relative: 0 %
HCT: 38.7 % — ABNORMAL LOW (ref 39.0–52.0)
Hemoglobin: 12.5 g/dL — ABNORMAL LOW (ref 13.0–17.0)
Immature Granulocytes: 1 %
Lymphocytes Relative: 9 %
Lymphs Abs: 1.5 10*3/uL (ref 0.7–4.0)
MCH: 29.2 pg (ref 26.0–34.0)
MCHC: 32.3 g/dL (ref 30.0–36.0)
MCV: 90.4 fL (ref 80.0–100.0)
Monocytes Absolute: 1.2 10*3/uL — ABNORMAL HIGH (ref 0.1–1.0)
Monocytes Relative: 7 %
Neutro Abs: 13.6 10*3/uL — ABNORMAL HIGH (ref 1.7–7.7)
Neutrophils Relative %: 83 %
Platelets: 273 10*3/uL (ref 150–400)
RBC: 4.28 MIL/uL (ref 4.22–5.81)
RDW: 14.9 % (ref 11.5–15.5)
WBC: 16.5 10*3/uL — ABNORMAL HIGH (ref 4.0–10.5)
nRBC: 0 % (ref 0.0–0.2)

## 2022-12-21 LAB — CK
Total CK: 2095 U/L — ABNORMAL HIGH (ref 49–397)
Total CK: 2024 U/L — ABNORMAL HIGH (ref 49–397)

## 2022-12-21 MED ORDER — LACTATED RINGERS IV BOLUS
1000.0000 mL | Freq: Once | INTRAVENOUS | Status: AC
  Administered 2022-12-21: 1000 mL via INTRAVENOUS

## 2022-12-21 MED ORDER — HALOPERIDOL LACTATE 5 MG/ML IJ SOLN: 2.0000 mg | Freq: Once | INTRAMUSCULAR | Status: AC

## 2022-12-21 MED ORDER — SODIUM CHLORIDE 0.9 % IV BOLUS
1000.0000 mL | Freq: Once | INTRAVENOUS | Status: AC
  Administered 2022-12-21: 1000 mL via INTRAVENOUS

## 2022-12-21 MED ORDER — HALOPERIDOL LACTATE 5 MG/ML IJ SOLN
INTRAMUSCULAR | Status: AC
  Administered 2022-12-21: 2 mg via INTRAVENOUS
  Filled 2022-12-21: qty 1

## 2022-12-21 MED ORDER — LACTATED RINGERS IV SOLN: INTRAVENOUS | Status: DC

## 2022-12-21 NOTE — ED Notes (Signed)
Restraints removed/ skin intact/ pt is waking up and getting dressed/ police at bedside/ pt refused sign d/c instructions via eSign.

## 2022-12-21 NOTE — ED Provider Notes (Signed)
Pt signed out by Dr. Zenia Resides pending 2nd ck.  2nd ck is still slightly elevated, but it is going down.  Urine without any myoglobin.  He is stable to be d/c.  Return if worse.    Isla Pence, MD 12/21/22 2101

## 2022-12-21 NOTE — ED Triage Notes (Signed)
Pt coming in via EMS and police with c/o altered mental status and combativeness. Pt was found breaking into cars when police were called. Patient was chased through woods, tazed x2, and restrained. Patient combative with police and EMS. Pt received 5 mg midazolam IM. Needs medical clearance. Pt currently A&Ox2. Denies any drug use. Multiple abrasions noted to patient's body.

## 2022-12-21 NOTE — ED Provider Notes (Signed)
[name] EMERGENCY DEPARTMENT AT [facility] Provider Note   CSN: [id number] Arrival date & time: 12/21/22  1209     History  Chief Complaint  Patient presents with   Altered Mental Status    Carlos Flowers is a 42 y.o. male.  42 year old male presents via Event organiser after being arrested just prior to arrival.  Patient was found to be breaking into cars.  Police chase ensued.  Per officers, he was tased twice due to not being cooperative.  No reported loss of consciousness.  Denied any psychiatric complaints during altercation.  EMS was called and patient required 5 mg of Versed due to severe agitation.  He was secured to the gurney and transported here.  Patient complains now of pain to his face and his left knee.  Notes discomfort from the taser prongs on his anterior chest and abdomen       Home Medications Prior to Admission medications   Medication Sig Start Date End Date Taking? Authorizing Provider  busPIRone (BUSPAR) 10 MG tablet Take 1 tablet (10 mg total) by mouth 3 (three) times daily. For anxiety 07/28/21   Revonda Humphrey, NP  cetaphil (CETAPHIL) lotion Apply topically as needed for dry skin. (Provide pt with the remaining lotion). 07/25/21   Lindell Spar I, NP  FLUoxetine (PROZAC) 40 MG capsule Take 1 capsule (40 mg total) by mouth daily. For depression 07/28/21   Revonda Humphrey, NP  gabapentin (NEURONTIN) 300 MG capsule Take 1 capsule (300 mg total) by mouth 3 (three) times daily. For alcohol withdrawal syndrome 07/28/21   Revonda Humphrey, NP  hydrOXYzine (ATARAX/VISTARIL) 25 MG tablet Take 1 tablet (25 mg total) by mouth 3 (three) times daily as needed for anxiety. 07/28/21   Revonda Humphrey, NP  Multiple Vitamin (MULTIVITAMIN WITH MINERALS) TABS tablet Take 1 tablet by mouth every morning.    [provider]  nicotine polacrilex (NICORETTE) 2 MG gum Take 1 each (2 mg total) by mouth as needed. (May buy from over the counter):  For smoking cessation 07/25/21   Lindell Spar I, NP  QUEtiapine (SEROQUEL) 100 MG tablet Take 1 tablet (100 mg total) by mouth at bedtime. For mood control 07/28/21   Revonda Humphrey, NP  thiamine 100 MG tablet Take 1 tablet (100 mg total) by mouth daily. For low thiamine 07/26/21   Lindell Spar I, NP  traZODone (DESYREL) 50 MG tablet Take 1 tablet (50 mg total) by mouth at bedtime as needed for sleep. 07/28/21   Revonda Humphrey, NP      Allergies    Peanut-containing drug products    Review of Systems   Review of Systems  All other systems reviewed and are negative.   Physical Exam Updated Vital Signs BP 137/84   Pulse (!) 121   Temp 97.7 F (36.5 C)   Resp 14   SpO2 100%  Physical Exam Vitals and nursing note reviewed.  Constitutional:      General: He is not in acute distress.    Appearance: Normal appearance. He is well-developed. He is not toxic-appearing.  HENT:     Head: Normocephalic and atraumatic.   Eyes:     General: Lids are normal.     Conjunctiva/sclera: Conjunctivae normal.     Pupils: Pupils are equal, round, and reactive to light.  Neck:     Thyroid: No thyroid mass.     Trachea: No tracheal deviation.  Cardiovascular:  Rate and Rhythm: Normal rate and regular rhythm.     Heart sounds: Normal heart sounds. No murmur heard.    No gallop.  Pulmonary:     Effort: Pulmonary effort is normal. No respiratory distress.     Breath sounds: Normal breath sounds. No stridor. No decreased breath sounds, wheezing, rhonchi or rales.  Chest:    Abdominal:     General: There is no distension.     Palpations: Abdomen is soft.     Tenderness: There is no abdominal tenderness. There is no rebound.    Musculoskeletal:        General: No tenderness. Normal range of motion.     Cervical back: Normal range of motion and neck supple.       Legs:  Skin:    General: Skin is warm and dry.     Findings: No abrasion or rash.  Neurological:     Mental Status: He  is alert and oriented to person, place, and time. Mental status is at baseline.     GCS: GCS eye subscore is 4. GCS verbal subscore is 5. GCS motor subscore is 6.     Cranial Nerves: No cranial nerve deficit.     Sensory: No sensory deficit.     Motor: Motor function is intact.  Psychiatric:        Attention and Perception: He is inattentive.        Mood and Affect: Affect is labile.        Speech: Speech is rapid and pressured.        Behavior: Behavior is aggressive and hyperactive.     ED Results / Procedures / Treatments   Labs (all labs ordered are listed, but only abnormal results are displayed) Labs Reviewed  CBC WITH DIFFERENTIAL/PLATELET  COMPREHENSIVE METABOLIC PANEL  CK    EKG None  Radiology No results found.  Procedures Procedures    Medications Ordered in ED Medications  haloperidol lactate (HALDOL) 5 MG/ML injection (has no administration in time range)  haloperidol lactate (HALDOL) injection 2 mg (has no administration in time range)    ED Course/ Medical Decision Making/ A&P                             Medical Decision Making Amount and/or Complexity of Data Reviewed Labs: ordered. Radiology: ordered.  Risk Prescription drug management.   Patient given 2 mg of Tylenol IV push due to his agitation.  Was placed on 4-point restraints for his as well as staff safety.  Concern for possible rhabdomyolysis and CK mildly elevated at 2100.  Will give IV fluids here and recheck.  Due to patient's behavior while he was being arrested, patient had head, neck, facial CTs was did not show any acute findings per my review and interpretation.  Left knee x-ray per my interpretation showed no acute findings.  Chest x-ray was performed and was negative per my review interpretation.  Patient rechecked multiple times and is resting comfortably at this time.   Plan will be for patient to receive IV fluids and formal lactated Ringer's.  Will repeat CK at that time.   Patient is in Patent examiner custody and will be discharged to them when awake and alert and medically stable.care will signed off to next provider  CRITICAL CARE Performed by: Toy Baker Total critical care time: 60 minutes Critical care time was exclusive of separately billable procedures and treating other  patients. Critical care was necessary to treat or prevent imminent or life-threatening deterioration. Critical care was time spent personally by me on the following activities: development of treatment plan with patient and/or surrogate as well as nursing, discussions with consultants, evaluation of patient's response to treatment, examination of patient, obtaining history from patient or surrogate, ordering and performing treatments and interventions, ordering and review of laboratory studies, ordering and review of radiographic studies, pulse oximetry and re-evaluation of patient's condition.         Final Clinical Impression(s) / ED Diagnoses Final diagnoses:  None    Rx / DC Orders ED Discharge Orders     None         Lacretia Leigh, MD 12/21/22 1416
